# Patient Record
Sex: Female | Born: 1935 | Race: White | Hispanic: No | Smoking: Former smoker
Health system: Southern US, Community
[De-identification: ages and names within clinical notes are randomized; demographics above are authoritative.]

## PROBLEM LIST (undated history)

## (undated) DIAGNOSIS — I10 Essential (primary) hypertension: Secondary | ICD-10-CM

## (undated) DIAGNOSIS — E079 Disorder of thyroid, unspecified: Secondary | ICD-10-CM

## (undated) DIAGNOSIS — F419 Anxiety disorder, unspecified: Secondary | ICD-10-CM

## (undated) DIAGNOSIS — E785 Hyperlipidemia, unspecified: Secondary | ICD-10-CM

## (undated) DIAGNOSIS — Z860101 Personal history of adenomatous and serrated colon polyps: Secondary | ICD-10-CM

## (undated) DIAGNOSIS — K6389 Other specified diseases of intestine: Secondary | ICD-10-CM

## (undated) DIAGNOSIS — I350 Nonrheumatic aortic (valve) stenosis: Secondary | ICD-10-CM

## (undated) DIAGNOSIS — K659 Peritonitis, unspecified: Secondary | ICD-10-CM

## (undated) DIAGNOSIS — K219 Gastro-esophageal reflux disease without esophagitis: Secondary | ICD-10-CM

## (undated) DIAGNOSIS — M1990 Unspecified osteoarthritis, unspecified site: Secondary | ICD-10-CM

## (undated) DIAGNOSIS — I739 Peripheral vascular disease, unspecified: Secondary | ICD-10-CM

## (undated) DIAGNOSIS — I4891 Unspecified atrial fibrillation: Secondary | ICD-10-CM

## (undated) DIAGNOSIS — I442 Atrioventricular block, complete: Secondary | ICD-10-CM

## (undated) DIAGNOSIS — J449 Chronic obstructive pulmonary disease, unspecified: Secondary | ICD-10-CM

## (undated) DIAGNOSIS — Z86010 Personal history of colonic polyps: Secondary | ICD-10-CM

## (undated) DIAGNOSIS — K56609 Unspecified intestinal obstruction, unspecified as to partial versus complete obstruction: Secondary | ICD-10-CM

## (undated) DIAGNOSIS — K5730 Diverticulosis of large intestine without perforation or abscess without bleeding: Secondary | ICD-10-CM

## (undated) DIAGNOSIS — E559 Vitamin D deficiency, unspecified: Secondary | ICD-10-CM

## (undated) DIAGNOSIS — K222 Esophageal obstruction: Secondary | ICD-10-CM

## (undated) HISTORY — DX: Esophageal obstruction: K22.2

## (undated) HISTORY — DX: Personal history of colonic polyps: Z86.010

## (undated) HISTORY — PX: ABDOMINAL ADHESION SURGERY: SHX90

## (undated) HISTORY — DX: Nonrheumatic aortic (valve) stenosis: I35.0

## (undated) HISTORY — PX: CHOLECYSTECTOMY: SHX55

## (undated) HISTORY — DX: Peripheral vascular disease, unspecified: I73.9

## (undated) HISTORY — DX: Disorder of thyroid, unspecified: E07.9

## (undated) HISTORY — PX: CARDIAC VALVE REPLACEMENT: SHX585

## (undated) HISTORY — DX: Unspecified intestinal obstruction, unspecified as to partial versus complete obstruction: K56.609

## (undated) HISTORY — DX: Atrioventricular block, complete: I44.2

## (undated) HISTORY — DX: Anxiety disorder, unspecified: F41.9

## (undated) HISTORY — DX: Essential (primary) hypertension: I10

## (undated) HISTORY — DX: Unspecified atrial fibrillation: I48.91

## (undated) HISTORY — DX: Vitamin D deficiency, unspecified: E55.9

## (undated) HISTORY — DX: Gastro-esophageal reflux disease without esophagitis: K21.9

## (undated) HISTORY — DX: Hyperlipidemia, unspecified: E78.5

## (undated) HISTORY — DX: Other specified diseases of intestine: K63.89

## (undated) HISTORY — DX: Peritonitis, unspecified: K65.9

## (undated) HISTORY — PX: OMENTECTOMY: SHX2098

## (undated) HISTORY — DX: Chronic obstructive pulmonary disease, unspecified: J44.9

## (undated) HISTORY — DX: Unspecified osteoarthritis, unspecified site: M19.90

## (undated) HISTORY — DX: Personal history of adenomatous and serrated colon polyps: Z86.0101

## (undated) HISTORY — DX: Diverticulosis of large intestine without perforation or abscess without bleeding: K57.30

## (undated) HISTORY — PX: SIGMOIDECTOMY: SHX176

## (undated) HISTORY — PX: HIP SURGERY: SHX245

---

## 1994-01-21 ENCOUNTER — Encounter (INDEPENDENT_AMBULATORY_CARE_PROVIDER_SITE_OTHER): Payer: Self-pay | Admitting: *Deleted

## 1994-01-22 ENCOUNTER — Encounter (INDEPENDENT_AMBULATORY_CARE_PROVIDER_SITE_OTHER): Payer: Self-pay | Admitting: *Deleted

## 1995-07-21 HISTORY — PX: COLOSTOMY CLOSURE: SHX1381

## 1995-07-21 HISTORY — PX: APPENDECTOMY: SHX54

## 1995-10-26 ENCOUNTER — Encounter (INDEPENDENT_AMBULATORY_CARE_PROVIDER_SITE_OTHER): Payer: Self-pay | Admitting: *Deleted

## 1995-10-28 ENCOUNTER — Encounter (INDEPENDENT_AMBULATORY_CARE_PROVIDER_SITE_OTHER): Payer: Self-pay | Admitting: *Deleted

## 1995-11-08 ENCOUNTER — Encounter (INDEPENDENT_AMBULATORY_CARE_PROVIDER_SITE_OTHER): Payer: Self-pay | Admitting: *Deleted

## 1996-04-06 ENCOUNTER — Encounter (INDEPENDENT_AMBULATORY_CARE_PROVIDER_SITE_OTHER): Payer: Self-pay | Admitting: *Deleted

## 1996-04-27 ENCOUNTER — Encounter (INDEPENDENT_AMBULATORY_CARE_PROVIDER_SITE_OTHER): Payer: Self-pay | Admitting: *Deleted

## 1996-04-28 ENCOUNTER — Encounter (INDEPENDENT_AMBULATORY_CARE_PROVIDER_SITE_OTHER): Payer: Self-pay | Admitting: *Deleted

## 1996-05-07 ENCOUNTER — Encounter (INDEPENDENT_AMBULATORY_CARE_PROVIDER_SITE_OTHER): Payer: Self-pay | Admitting: *Deleted

## 1997-01-09 ENCOUNTER — Encounter (INDEPENDENT_AMBULATORY_CARE_PROVIDER_SITE_OTHER): Payer: Self-pay | Admitting: *Deleted

## 1997-09-03 ENCOUNTER — Encounter (INDEPENDENT_AMBULATORY_CARE_PROVIDER_SITE_OTHER): Payer: Self-pay | Admitting: *Deleted

## 1998-06-17 ENCOUNTER — Other Ambulatory Visit: Admission: RE | Admit: 1998-06-17 | Discharge: 1998-06-17 | Payer: Self-pay | Admitting: Obstetrics and Gynecology

## 1999-03-19 ENCOUNTER — Other Ambulatory Visit: Admission: RE | Admit: 1999-03-19 | Discharge: 1999-03-19 | Payer: Self-pay | Admitting: Obstetrics and Gynecology

## 1999-07-17 ENCOUNTER — Encounter (INDEPENDENT_AMBULATORY_CARE_PROVIDER_SITE_OTHER): Payer: Self-pay | Admitting: *Deleted

## 1999-08-12 ENCOUNTER — Ambulatory Visit (HOSPITAL_COMMUNITY): Admission: RE | Admit: 1999-08-12 | Discharge: 1999-08-12 | Payer: Self-pay | Admitting: Gastroenterology

## 1999-08-12 ENCOUNTER — Encounter (INDEPENDENT_AMBULATORY_CARE_PROVIDER_SITE_OTHER): Payer: Self-pay | Admitting: *Deleted

## 1999-08-12 ENCOUNTER — Encounter: Payer: Self-pay | Admitting: Gastroenterology

## 2000-07-04 ENCOUNTER — Encounter: Payer: Self-pay | Admitting: Emergency Medicine

## 2000-07-04 ENCOUNTER — Emergency Department (HOSPITAL_COMMUNITY): Admission: EM | Admit: 2000-07-04 | Discharge: 2000-07-04 | Payer: Self-pay

## 2002-01-24 ENCOUNTER — Encounter (INDEPENDENT_AMBULATORY_CARE_PROVIDER_SITE_OTHER): Payer: Self-pay | Admitting: *Deleted

## 2002-01-24 ENCOUNTER — Other Ambulatory Visit: Admission: RE | Admit: 2002-01-24 | Discharge: 2002-01-24 | Payer: Self-pay | Admitting: Obstetrics and Gynecology

## 2002-02-20 ENCOUNTER — Ambulatory Visit (HOSPITAL_COMMUNITY): Admission: RE | Admit: 2002-02-20 | Discharge: 2002-02-20 | Payer: Self-pay | Admitting: Gastroenterology

## 2002-02-20 ENCOUNTER — Encounter (INDEPENDENT_AMBULATORY_CARE_PROVIDER_SITE_OTHER): Payer: Self-pay | Admitting: *Deleted

## 2003-02-27 ENCOUNTER — Emergency Department (HOSPITAL_COMMUNITY): Admission: EM | Admit: 2003-02-27 | Discharge: 2003-02-28 | Payer: Self-pay | Admitting: Emergency Medicine

## 2003-05-02 ENCOUNTER — Encounter: Admission: RE | Admit: 2003-05-02 | Discharge: 2003-05-02 | Payer: Self-pay | Admitting: Internal Medicine

## 2003-05-02 ENCOUNTER — Encounter (INDEPENDENT_AMBULATORY_CARE_PROVIDER_SITE_OTHER): Payer: Self-pay | Admitting: *Deleted

## 2003-05-02 ENCOUNTER — Encounter: Payer: Self-pay | Admitting: Internal Medicine

## 2003-06-18 ENCOUNTER — Encounter: Admission: RE | Admit: 2003-06-18 | Discharge: 2003-06-18 | Payer: Self-pay | Admitting: General Surgery

## 2003-06-18 ENCOUNTER — Encounter (INDEPENDENT_AMBULATORY_CARE_PROVIDER_SITE_OTHER): Payer: Self-pay | Admitting: *Deleted

## 2003-06-25 ENCOUNTER — Encounter (INDEPENDENT_AMBULATORY_CARE_PROVIDER_SITE_OTHER): Payer: Self-pay | Admitting: *Deleted

## 2003-07-23 ENCOUNTER — Encounter (INDEPENDENT_AMBULATORY_CARE_PROVIDER_SITE_OTHER): Payer: Self-pay | Admitting: *Deleted

## 2003-08-21 ENCOUNTER — Encounter: Admission: RE | Admit: 2003-08-21 | Discharge: 2003-08-21 | Payer: Self-pay | Admitting: General Surgery

## 2003-08-21 ENCOUNTER — Encounter (INDEPENDENT_AMBULATORY_CARE_PROVIDER_SITE_OTHER): Payer: Self-pay | Admitting: *Deleted

## 2003-08-27 ENCOUNTER — Encounter (INDEPENDENT_AMBULATORY_CARE_PROVIDER_SITE_OTHER): Payer: Self-pay | Admitting: *Deleted

## 2003-09-04 ENCOUNTER — Encounter (INDEPENDENT_AMBULATORY_CARE_PROVIDER_SITE_OTHER): Payer: Self-pay | Admitting: *Deleted

## 2004-01-08 ENCOUNTER — Encounter (INDEPENDENT_AMBULATORY_CARE_PROVIDER_SITE_OTHER): Payer: Self-pay | Admitting: *Deleted

## 2004-02-18 ENCOUNTER — Encounter (INDEPENDENT_AMBULATORY_CARE_PROVIDER_SITE_OTHER): Payer: Self-pay | Admitting: *Deleted

## 2004-02-18 ENCOUNTER — Encounter: Admission: RE | Admit: 2004-02-18 | Discharge: 2004-02-18 | Payer: Self-pay | Admitting: General Surgery

## 2004-05-26 ENCOUNTER — Ambulatory Visit: Payer: Self-pay | Admitting: Internal Medicine

## 2004-06-16 ENCOUNTER — Ambulatory Visit: Payer: Self-pay | Admitting: Internal Medicine

## 2004-08-08 ENCOUNTER — Ambulatory Visit: Payer: Self-pay | Admitting: Internal Medicine

## 2004-08-11 ENCOUNTER — Ambulatory Visit: Payer: Self-pay | Admitting: Internal Medicine

## 2004-10-29 ENCOUNTER — Encounter (INDEPENDENT_AMBULATORY_CARE_PROVIDER_SITE_OTHER): Payer: Self-pay | Admitting: *Deleted

## 2004-10-29 ENCOUNTER — Encounter: Admission: RE | Admit: 2004-10-29 | Discharge: 2004-10-29 | Payer: Self-pay | Admitting: General Surgery

## 2004-11-03 ENCOUNTER — Encounter (INDEPENDENT_AMBULATORY_CARE_PROVIDER_SITE_OTHER): Payer: Self-pay | Admitting: *Deleted

## 2004-11-10 ENCOUNTER — Ambulatory Visit: Payer: Self-pay | Admitting: Internal Medicine

## 2004-11-11 ENCOUNTER — Encounter (INDEPENDENT_AMBULATORY_CARE_PROVIDER_SITE_OTHER): Payer: Self-pay | Admitting: *Deleted

## 2004-11-24 ENCOUNTER — Encounter (HOSPITAL_COMMUNITY): Admission: RE | Admit: 2004-11-24 | Discharge: 2004-11-27 | Payer: Self-pay | Admitting: General Surgery

## 2004-11-27 ENCOUNTER — Encounter (INDEPENDENT_AMBULATORY_CARE_PROVIDER_SITE_OTHER): Payer: Self-pay | Admitting: *Deleted

## 2005-02-09 ENCOUNTER — Ambulatory Visit: Payer: Self-pay | Admitting: Internal Medicine

## 2005-02-18 ENCOUNTER — Ambulatory Visit: Payer: Self-pay

## 2005-05-11 ENCOUNTER — Ambulatory Visit: Payer: Self-pay | Admitting: Internal Medicine

## 2005-06-22 ENCOUNTER — Ambulatory Visit: Payer: Self-pay | Admitting: Cardiology

## 2005-06-24 ENCOUNTER — Ambulatory Visit: Payer: Self-pay | Admitting: *Deleted

## 2005-06-24 ENCOUNTER — Encounter (INDEPENDENT_AMBULATORY_CARE_PROVIDER_SITE_OTHER): Payer: Self-pay | Admitting: *Deleted

## 2005-08-12 ENCOUNTER — Ambulatory Visit: Payer: Self-pay | Admitting: Internal Medicine

## 2005-11-10 ENCOUNTER — Ambulatory Visit: Payer: Self-pay | Admitting: Internal Medicine

## 2006-01-07 ENCOUNTER — Ambulatory Visit: Payer: Self-pay | Admitting: Internal Medicine

## 2006-01-09 ENCOUNTER — Ambulatory Visit: Payer: Self-pay | Admitting: Internal Medicine

## 2006-02-09 ENCOUNTER — Ambulatory Visit: Payer: Self-pay | Admitting: Internal Medicine

## 2006-05-10 ENCOUNTER — Ambulatory Visit: Payer: Self-pay | Admitting: Internal Medicine

## 2006-05-10 LAB — CONVERTED CEMR LAB
HDL: 40.9 mg/dL (ref >39.0)
LDL Cholesterol: 101 mg/dL — ABNORMAL HIGH (ref 0–99)
Triglyceride fasting, serum: 167 mg/dL — ABNORMAL HIGH (ref 0–149)
VLDL: 33 mg/dL (ref 0–40)

## 2006-05-12 ENCOUNTER — Ambulatory Visit: Payer: Self-pay | Admitting: Internal Medicine

## 2006-07-01 ENCOUNTER — Encounter (INDEPENDENT_AMBULATORY_CARE_PROVIDER_SITE_OTHER): Payer: Self-pay | Admitting: *Deleted

## 2006-07-01 ENCOUNTER — Inpatient Hospital Stay (HOSPITAL_COMMUNITY): Admission: EM | Admit: 2006-07-01 | Discharge: 2006-07-03 | Payer: Self-pay | Admitting: Emergency Medicine

## 2006-07-02 ENCOUNTER — Encounter (INDEPENDENT_AMBULATORY_CARE_PROVIDER_SITE_OTHER): Payer: Self-pay | Admitting: *Deleted

## 2006-08-13 ENCOUNTER — Ambulatory Visit: Payer: Self-pay | Admitting: Internal Medicine

## 2006-11-10 ENCOUNTER — Ambulatory Visit: Payer: Self-pay | Admitting: Internal Medicine

## 2006-11-10 LAB — CONVERTED CEMR LAB
BUN: 13 mg/dL (ref 6–23)
Direct LDL: 81.2 mg/dL
Eosinophils Relative: 2.8 % (ref 0.0–5.0)
Glucose, Bld: 113 mg/dL — ABNORMAL HIGH (ref 70–99)
HCT: 38.6 % (ref 36.0–46.0)
Neutrophils Relative %: 45.8 % (ref 43.0–77.0)
Potassium: 3.8 meq/L (ref 3.5–5.1)
RBC: 4.32 M/uL (ref 3.87–5.11)
RDW: 12.6 % (ref 11.5–14.6)
Total CHOL/HDL Ratio: 3.5
Triglycerides: 205 mg/dL (ref 0–149)
VLDL: 41 mg/dL — ABNORMAL HIGH (ref 0–40)
WBC: 5.5 10*3/uL (ref 4.5–10.5)

## 2006-11-15 ENCOUNTER — Ambulatory Visit: Payer: Self-pay | Admitting: Internal Medicine

## 2006-12-14 ENCOUNTER — Ambulatory Visit: Payer: Self-pay | Admitting: Internal Medicine

## 2006-12-14 LAB — CONVERTED CEMR LAB
Basophils Absolute: 0.3 10*3/uL — ABNORMAL HIGH (ref 0.0–0.1)
Eosinophils Absolute: 0.1 10*3/uL (ref 0.0–0.6)
HCT: 41.1 % (ref 36.0–46.0)
Hemoglobin, Urine: NEGATIVE
Ketones, ur: NEGATIVE mg/dL
Lymphocytes Relative: 38.6 % (ref 12.0–46.0)
MCHC: 34.4 g/dL (ref 30.0–36.0)
MCV: 87.9 fL (ref 78.0–100.0)
Neutro Abs: 2.5 10*3/uL (ref 1.4–7.7)
Neutrophils Relative %: 45.3 % (ref 43.0–77.0)
RBC: 4.68 M/uL (ref 3.87–5.11)
Urine Glucose: NEGATIVE mg/dL
Urobilinogen, UA: 0.2 (ref 0.0–1.0)

## 2006-12-17 ENCOUNTER — Ambulatory Visit: Payer: Self-pay | Admitting: Internal Medicine

## 2007-02-15 ENCOUNTER — Ambulatory Visit: Payer: Self-pay | Admitting: Internal Medicine

## 2007-02-15 LAB — CONVERTED CEMR LAB
ALT: 19 units/L (ref 0–35)
AST: 19 units/L (ref 0–37)
Albumin: 4.4 g/dL (ref 3.5–5.2)
Alkaline Phosphatase: 66 units/L (ref 39–117)
BUN: 11 mg/dL (ref 6–23)
Calcium: 10 mg/dL (ref 8.4–10.5)
Chloride: 102 meq/L (ref 96–112)
Cholesterol: 245 mg/dL (ref 0–200)
Creatinine, Ser: 0.8 mg/dL (ref 0.4–1.2)
GFR calc non Af Amer: 72 mL/min
Total Bilirubin: 1.3 mg/dL — ABNORMAL HIGH (ref 0.3–1.2)
Total CHOL/HDL Ratio: 5.6
VLDL: 47 mg/dL — ABNORMAL HIGH (ref 0–40)
Vit D, 1,25-Dihydroxy: 19 — ABNORMAL LOW (ref 20–57)

## 2007-02-26 DIAGNOSIS — I739 Peripheral vascular disease, unspecified: Secondary | ICD-10-CM

## 2007-02-26 DIAGNOSIS — J4489 Other specified chronic obstructive pulmonary disease: Secondary | ICD-10-CM | POA: Insufficient documentation

## 2007-02-26 DIAGNOSIS — R1909 Other intra-abdominal and pelvic swelling, mass and lump: Secondary | ICD-10-CM

## 2007-02-26 DIAGNOSIS — I10 Essential (primary) hypertension: Secondary | ICD-10-CM

## 2007-02-26 DIAGNOSIS — K219 Gastro-esophageal reflux disease without esophagitis: Secondary | ICD-10-CM | POA: Insufficient documentation

## 2007-02-26 DIAGNOSIS — K5730 Diverticulosis of large intestine without perforation or abscess without bleeding: Secondary | ICD-10-CM | POA: Insufficient documentation

## 2007-02-26 DIAGNOSIS — J449 Chronic obstructive pulmonary disease, unspecified: Secondary | ICD-10-CM

## 2007-02-26 DIAGNOSIS — E785 Hyperlipidemia, unspecified: Secondary | ICD-10-CM | POA: Insufficient documentation

## 2007-05-19 ENCOUNTER — Encounter: Payer: Self-pay | Admitting: Internal Medicine

## 2007-05-19 ENCOUNTER — Ambulatory Visit: Payer: Self-pay | Admitting: Internal Medicine

## 2007-07-21 ENCOUNTER — Inpatient Hospital Stay (HOSPITAL_COMMUNITY): Admission: EM | Admit: 2007-07-21 | Discharge: 2007-07-23 | Payer: Self-pay | Admitting: Emergency Medicine

## 2007-07-21 ENCOUNTER — Encounter (INDEPENDENT_AMBULATORY_CARE_PROVIDER_SITE_OTHER): Payer: Self-pay | Admitting: *Deleted

## 2007-07-21 ENCOUNTER — Ambulatory Visit: Payer: Self-pay | Admitting: Internal Medicine

## 2007-07-22 ENCOUNTER — Encounter (INDEPENDENT_AMBULATORY_CARE_PROVIDER_SITE_OTHER): Payer: Self-pay | Admitting: *Deleted

## 2007-07-23 ENCOUNTER — Encounter (INDEPENDENT_AMBULATORY_CARE_PROVIDER_SITE_OTHER): Payer: Self-pay | Admitting: *Deleted

## 2007-07-27 ENCOUNTER — Ambulatory Visit: Payer: Self-pay | Admitting: Internal Medicine

## 2007-07-27 DIAGNOSIS — K56609 Unspecified intestinal obstruction, unspecified as to partial versus complete obstruction: Secondary | ICD-10-CM | POA: Insufficient documentation

## 2007-08-29 ENCOUNTER — Ambulatory Visit: Payer: Self-pay | Admitting: Internal Medicine

## 2007-08-29 DIAGNOSIS — E559 Vitamin D deficiency, unspecified: Secondary | ICD-10-CM | POA: Insufficient documentation

## 2007-08-29 DIAGNOSIS — R110 Nausea: Secondary | ICD-10-CM

## 2007-08-30 LAB — CONVERTED CEMR LAB
ALT: 22 units/L (ref 0–35)
Albumin: 3.8 g/dL (ref 3.5–5.2)
Alkaline Phosphatase: 71 units/L (ref 39–117)
BUN: 8 mg/dL (ref 6–23)
Basophils Absolute: 0.1 10*3/uL (ref 0.0–0.1)
Basophils Relative: 1.2 % — ABNORMAL HIGH (ref 0.0–1.0)
CO2: 30 meq/L (ref 19–32)
Calcium: 9.9 mg/dL (ref 8.4–10.5)
Cholesterol: 215 mg/dL (ref 0–200)
Creatinine, Ser: 0.9 mg/dL (ref 0.4–1.2)
GFR calc Af Amer: 76 mL/min
HDL: 50.7 mg/dL (ref >39.0)
MCHC: 33.5 g/dL (ref 30.0–36.0)
Monocytes Absolute: 0.5 10*3/uL (ref 0.2–0.7)
Monocytes Relative: 9.1 % (ref 3.0–11.0)
Neutro Abs: 2.8 10*3/uL (ref 1.4–7.7)
Platelets: 287 10*3/uL (ref 150–400)
Potassium: 4.3 meq/L (ref 3.5–5.1)
RDW: 12.1 % (ref 11.5–14.6)
Total CHOL/HDL Ratio: 4.2
Total Protein: 7.3 g/dL (ref 6.0–8.3)
VLDL: 28 mg/dL (ref 0–40)

## 2007-08-31 LAB — CONVERTED CEMR LAB: Vit D, 1,25-Dihydroxy: 20 — ABNORMAL LOW (ref 30–89)

## 2007-10-10 ENCOUNTER — Encounter: Payer: Self-pay | Admitting: Internal Medicine

## 2007-10-31 ENCOUNTER — Telehealth (INDEPENDENT_AMBULATORY_CARE_PROVIDER_SITE_OTHER): Payer: Self-pay | Admitting: *Deleted

## 2007-11-02 ENCOUNTER — Ambulatory Visit: Payer: Self-pay | Admitting: Internal Medicine

## 2007-11-02 DIAGNOSIS — R209 Unspecified disturbances of skin sensation: Secondary | ICD-10-CM

## 2007-11-02 DIAGNOSIS — M79609 Pain in unspecified limb: Secondary | ICD-10-CM

## 2007-11-02 LAB — CONVERTED CEMR LAB
BUN: 14 mg/dL (ref 6–23)
Basophils Relative: 1 % (ref 0.0–1.0)
Calcium: 10 mg/dL (ref 8.4–10.5)
Creatinine, Ser: 0.8 mg/dL (ref 0.4–1.2)
Eosinophils Absolute: 0.1 10*3/uL (ref 0.0–0.7)
Eosinophils Relative: 1.4 % (ref 0.0–5.0)
GFR calc Af Amer: 87 mL/min
GFR calc non Af Amer: 72 mL/min
Glucose, Bld: 119 mg/dL — ABNORMAL HIGH (ref 70–99)
HCT: 37.9 % (ref 36.0–46.0)
Hemoglobin: 13.3 g/dL (ref 12.0–15.0)
MCV: 89.7 fL (ref 78.0–100.0)
Monocytes Absolute: 0.5 10*3/uL (ref 0.1–1.0)
Monocytes Relative: 9.3 % (ref 3.0–12.0)
Neutro Abs: 3.6 10*3/uL (ref 1.4–7.7)
WBC: 5.7 10*3/uL (ref 4.5–10.5)

## 2007-11-28 ENCOUNTER — Encounter: Payer: Self-pay | Admitting: Internal Medicine

## 2007-12-08 ENCOUNTER — Telehealth: Payer: Self-pay | Admitting: Internal Medicine

## 2008-01-10 ENCOUNTER — Ambulatory Visit: Payer: Self-pay | Admitting: Internal Medicine

## 2008-03-30 ENCOUNTER — Encounter: Payer: Self-pay | Admitting: Internal Medicine

## 2008-04-04 ENCOUNTER — Encounter: Payer: Self-pay | Admitting: Internal Medicine

## 2008-05-09 ENCOUNTER — Ambulatory Visit: Payer: Self-pay | Admitting: Internal Medicine

## 2008-05-09 LAB — CONVERTED CEMR LAB
Albumin: 3.7 g/dL (ref 3.5–5.2)
Alkaline Phosphatase: 64 units/L (ref 39–117)
BUN: 14 mg/dL (ref 6–23)
Cholesterol: 195 mg/dL (ref 0–200)
GFR calc Af Amer: 87 mL/min
GFR calc non Af Amer: 72 mL/min
LDL Cholesterol: 123 mg/dL — ABNORMAL HIGH (ref 0–99)
Potassium: 4 meq/L (ref 3.5–5.1)
Total Bilirubin: 1 mg/dL (ref 0.3–1.2)
Total CHOL/HDL Ratio: 4.5
VLDL: 29 mg/dL (ref 0–40)

## 2008-05-15 ENCOUNTER — Ambulatory Visit: Payer: Self-pay | Admitting: Internal Medicine

## 2008-09-04 ENCOUNTER — Telehealth: Payer: Self-pay | Admitting: Internal Medicine

## 2008-09-06 ENCOUNTER — Telehealth: Payer: Self-pay | Admitting: Internal Medicine

## 2008-09-07 ENCOUNTER — Telehealth: Payer: Self-pay | Admitting: Internal Medicine

## 2008-09-11 ENCOUNTER — Ambulatory Visit: Payer: Self-pay | Admitting: Internal Medicine

## 2008-09-11 DIAGNOSIS — H109 Unspecified conjunctivitis: Secondary | ICD-10-CM | POA: Insufficient documentation

## 2008-09-11 DIAGNOSIS — R21 Rash and other nonspecific skin eruption: Secondary | ICD-10-CM

## 2008-09-11 DIAGNOSIS — K1370 Unspecified lesions of oral mucosa: Secondary | ICD-10-CM

## 2008-09-13 LAB — CONVERTED CEMR LAB
AST: 22 units/L (ref 0–37)
Albumin: 3.9 g/dL (ref 3.5–5.2)
Alkaline Phosphatase: 73 units/L (ref 39–117)
Basophils Absolute: 0 10*3/uL (ref 0.0–0.1)
Bilirubin, Direct: 0.1 mg/dL (ref 0.0–0.3)
Chloride: 97 meq/L (ref 96–112)
Eosinophils Absolute: 0.2 10*3/uL (ref 0.0–0.7)
Eosinophils Relative: 1.7 % (ref 0.0–5.0)
GFR calc Af Amer: 102 mL/min
GFR calc non Af Amer: 84 mL/min
HCT: 40 % (ref 36.0–46.0)
Hemoglobin: 13.9 g/dL (ref 12.0–15.0)
Ketones, ur: NEGATIVE mg/dL
Leukocytes, UA: NEGATIVE
MCHC: 34.7 g/dL (ref 30.0–36.0)
MCV: 90.8 fL (ref 78.0–100.0)
Monocytes Absolute: 0.8 10*3/uL (ref 0.1–1.0)
Neutrophils Relative %: 67.8 % (ref 43.0–77.0)
Nitrite: NEGATIVE
Platelets: 340 10*3/uL (ref 150–400)
Potassium: 4.6 meq/L (ref 3.5–5.1)
RBC: 4.41 M/uL (ref 3.87–5.11)
RDW: 11.8 % (ref 11.5–14.6)
Sodium: 137 meq/L (ref 135–145)
Specific Gravity, Urine: 1.01 (ref 1.000–1.03)
Total Bilirubin: 0.8 mg/dL (ref 0.3–1.2)
pH: 6.5 (ref 5.0–8.0)

## 2008-09-18 ENCOUNTER — Ambulatory Visit: Payer: Self-pay | Admitting: Internal Medicine

## 2008-11-22 ENCOUNTER — Encounter: Payer: Self-pay | Admitting: Internal Medicine

## 2009-01-04 ENCOUNTER — Ambulatory Visit: Payer: Self-pay | Admitting: Internal Medicine

## 2009-01-04 LAB — CONVERTED CEMR LAB
ALT: 18 units/L (ref 0–35)
AST: 21 units/L (ref 0–37)
BUN: 12 mg/dL (ref 6–23)
Bilirubin, Direct: 0.2 mg/dL (ref 0.0–0.3)
CO2: 30 meq/L (ref 19–32)
Chloride: 104 meq/L (ref 96–112)
Cholesterol: 221 mg/dL — ABNORMAL HIGH (ref 0–200)
Creatinine, Ser: 0.8 mg/dL (ref 0.4–1.2)
Hgb A1c MFr Bld: 6 % (ref 4.6–6.5)
Potassium: 4.4 meq/L (ref 3.5–5.1)
Total Bilirubin: 1.1 mg/dL (ref 0.3–1.2)
Total CHOL/HDL Ratio: 4
Total Protein: 7.5 g/dL (ref 6.0–8.3)
Triglycerides: 163 mg/dL — ABNORMAL HIGH (ref 0.0–149.0)

## 2009-01-08 ENCOUNTER — Ambulatory Visit: Payer: Self-pay | Admitting: Internal Medicine

## 2009-03-11 ENCOUNTER — Telehealth: Payer: Self-pay | Admitting: Internal Medicine

## 2009-03-11 ENCOUNTER — Ambulatory Visit: Payer: Self-pay | Admitting: Internal Medicine

## 2009-03-11 DIAGNOSIS — R42 Dizziness and giddiness: Secondary | ICD-10-CM

## 2009-04-16 ENCOUNTER — Emergency Department (HOSPITAL_COMMUNITY): Admission: EM | Admit: 2009-04-16 | Discharge: 2009-04-16 | Payer: Self-pay | Admitting: Emergency Medicine

## 2009-04-17 ENCOUNTER — Ambulatory Visit: Payer: Self-pay | Admitting: Internal Medicine

## 2009-04-17 DIAGNOSIS — S0003XA Contusion of scalp, initial encounter: Secondary | ICD-10-CM | POA: Insufficient documentation

## 2009-04-17 DIAGNOSIS — R071 Chest pain on breathing: Secondary | ICD-10-CM

## 2009-04-17 DIAGNOSIS — S0083XA Contusion of other part of head, initial encounter: Secondary | ICD-10-CM

## 2009-04-17 DIAGNOSIS — S1093XA Contusion of unspecified part of neck, initial encounter: Secondary | ICD-10-CM

## 2009-04-25 ENCOUNTER — Telehealth: Payer: Self-pay | Admitting: Internal Medicine

## 2009-04-26 ENCOUNTER — Ambulatory Visit: Payer: Self-pay | Admitting: Internal Medicine

## 2009-05-03 ENCOUNTER — Ambulatory Visit: Payer: Self-pay | Admitting: Internal Medicine

## 2009-05-03 LAB — CONVERTED CEMR LAB
ALT: 17 units/L (ref 0–35)
AST: 18 units/L (ref 0–37)
Alkaline Phosphatase: 93 units/L (ref 39–117)
BUN: 10 mg/dL (ref 6–23)
Chloride: 104 meq/L (ref 96–112)
GFR calc non Af Amer: 83.8 mL/min (ref >60)
LDL Cholesterol: 128 mg/dL — ABNORMAL HIGH (ref 0–99)
Potassium: 4.4 meq/L (ref 3.5–5.1)
Sodium: 141 meq/L (ref 135–145)
Total Bilirubin: 0.7 mg/dL (ref 0.3–1.2)
Total CHOL/HDL Ratio: 4
VLDL: 17.6 mg/dL (ref 0.0–40.0)

## 2009-05-07 ENCOUNTER — Ambulatory Visit: Payer: Self-pay | Admitting: Internal Medicine

## 2009-05-07 DIAGNOSIS — Z87891 Personal history of nicotine dependence: Secondary | ICD-10-CM

## 2009-06-18 ENCOUNTER — Ambulatory Visit: Payer: Self-pay | Admitting: Internal Medicine

## 2009-06-27 ENCOUNTER — Encounter: Payer: Self-pay | Admitting: Internal Medicine

## 2009-07-20 DIAGNOSIS — I4891 Unspecified atrial fibrillation: Secondary | ICD-10-CM

## 2009-07-20 HISTORY — DX: Unspecified atrial fibrillation: I48.91

## 2009-08-08 ENCOUNTER — Ambulatory Visit: Payer: Self-pay | Admitting: Internal Medicine

## 2009-08-08 LAB — CONVERTED CEMR LAB
Chloride: 103 meq/L (ref 96–112)
GFR calc non Af Amer: 83.75 mL/min (ref >60)
Potassium: 4.7 meq/L (ref 3.5–5.1)
Sodium: 142 meq/L (ref 135–145)

## 2009-08-13 ENCOUNTER — Ambulatory Visit: Payer: Self-pay | Admitting: Internal Medicine

## 2009-08-26 ENCOUNTER — Encounter: Payer: Self-pay | Admitting: Internal Medicine

## 2009-08-26 ENCOUNTER — Telehealth: Payer: Self-pay | Admitting: Internal Medicine

## 2009-08-27 ENCOUNTER — Encounter: Payer: Self-pay | Admitting: Internal Medicine

## 2009-10-17 ENCOUNTER — Encounter: Payer: Self-pay | Admitting: Internal Medicine

## 2009-11-13 ENCOUNTER — Encounter: Payer: Self-pay | Admitting: Internal Medicine

## 2009-11-29 ENCOUNTER — Ambulatory Visit: Payer: Self-pay | Admitting: Internal Medicine

## 2009-11-29 LAB — CONVERTED CEMR LAB
ALT: 18 units/L (ref 0–35)
CO2: 31 meq/L (ref 19–32)
Calcium: 9.8 mg/dL (ref 8.4–10.5)
Chloride: 107 meq/L (ref 96–112)
Direct LDL: 129.7 mg/dL
Glucose, Bld: 100 mg/dL — ABNORMAL HIGH (ref 70–99)
HDL: 56.7 mg/dL (ref >39.00)
Hgb A1c MFr Bld: 6 % (ref 4.6–6.5)
Potassium: 4.8 meq/L (ref 3.5–5.1)
Sodium: 143 meq/L (ref 135–145)
TSH: 1.54 microintl units/mL (ref 0.35–5.50)
Total Bilirubin: 0.7 mg/dL (ref 0.3–1.2)
Total CHOL/HDL Ratio: 4
Triglycerides: 147 mg/dL (ref 0.0–149.0)
VLDL: 29.4 mg/dL (ref 0.0–40.0)

## 2009-12-03 ENCOUNTER — Ambulatory Visit: Payer: Self-pay | Admitting: Internal Medicine

## 2010-01-24 ENCOUNTER — Ambulatory Visit: Payer: Self-pay | Admitting: Internal Medicine

## 2010-01-24 DIAGNOSIS — R079 Chest pain, unspecified: Secondary | ICD-10-CM | POA: Insufficient documentation

## 2010-01-24 DIAGNOSIS — F419 Anxiety disorder, unspecified: Secondary | ICD-10-CM

## 2010-02-13 ENCOUNTER — Encounter: Payer: Self-pay | Admitting: Internal Medicine

## 2010-04-04 ENCOUNTER — Ambulatory Visit: Payer: Self-pay | Admitting: Internal Medicine

## 2010-04-04 LAB — CONVERTED CEMR LAB
AST: 17 units/L (ref 0–37)
Alkaline Phosphatase: 63 units/L (ref 39–117)
Basophils Absolute: 0 10*3/uL (ref 0.0–0.1)
Bilirubin, Direct: 0.1 mg/dL (ref 0.0–0.3)
CO2: 31 meq/L (ref 19–32)
Calcium: 9.8 mg/dL (ref 8.4–10.5)
Creatinine, Ser: 0.7 mg/dL (ref 0.4–1.2)
Eosinophils Absolute: 0.1 10*3/uL (ref 0.0–0.7)
GFR calc non Af Amer: 79.68 mL/min (ref >60)
Glucose, Bld: 89 mg/dL (ref 70–99)
HDL: 48.4 mg/dL (ref >39.00)
Hemoglobin: 13.5 g/dL (ref 12.0–15.0)
Ketones, ur: NEGATIVE mg/dL
Lymphocytes Relative: 32.7 % (ref 12.0–46.0)
MCHC: 34.7 g/dL (ref 30.0–36.0)
Monocytes Relative: 8.5 % (ref 3.0–12.0)
Neutro Abs: 2.4 10*3/uL (ref 1.4–7.7)
Neutrophils Relative %: 45.6 % (ref 43.0–77.0)
RDW: 12.9 % (ref 11.5–14.6)
Sodium: 142 meq/L (ref 135–145)
Specific Gravity, Urine: 1.015 (ref 1.000–1.030)
Total Bilirubin: 0.7 mg/dL (ref 0.3–1.2)
Total CHOL/HDL Ratio: 4
Total Protein, Urine: NEGATIVE mg/dL
Triglycerides: 133 mg/dL (ref 0.0–149.0)
Urine Glucose: NEGATIVE mg/dL
Urobilinogen, UA: 1 (ref 0.0–1.0)
VLDL: 26.6 mg/dL (ref 0.0–40.0)
pH: 7.5 (ref 5.0–8.0)

## 2010-04-08 ENCOUNTER — Ambulatory Visit: Payer: Self-pay | Admitting: Internal Medicine

## 2010-04-08 DIAGNOSIS — R238 Other skin changes: Secondary | ICD-10-CM

## 2010-05-21 ENCOUNTER — Ambulatory Visit: Payer: Self-pay | Admitting: Internal Medicine

## 2010-05-21 ENCOUNTER — Encounter: Payer: Self-pay | Admitting: Internal Medicine

## 2010-05-21 DIAGNOSIS — R011 Cardiac murmur, unspecified: Secondary | ICD-10-CM

## 2010-05-21 DIAGNOSIS — I4891 Unspecified atrial fibrillation: Secondary | ICD-10-CM | POA: Insufficient documentation

## 2010-05-21 DIAGNOSIS — I359 Nonrheumatic aortic valve disorder, unspecified: Secondary | ICD-10-CM

## 2010-05-27 ENCOUNTER — Telehealth: Payer: Self-pay | Admitting: Internal Medicine

## 2010-05-27 ENCOUNTER — Ambulatory Visit: Payer: Self-pay | Admitting: Internal Medicine

## 2010-05-28 LAB — CONVERTED CEMR LAB
Basophils Relative: 0.7 % (ref 0.0–3.0)
CO2: 22 meq/L (ref 19–32)
Chloride: 104 meq/L (ref 96–112)
Creatinine, Ser: 0.9 mg/dL (ref 0.4–1.2)
Eosinophils Relative: 2.3 % (ref 0.0–5.0)
Hemoglobin: 11.9 g/dL — ABNORMAL LOW (ref 12.0–15.0)
Lymphocytes Relative: 16.7 % (ref 12.0–46.0)
MCV: 92.8 fL (ref 78.0–100.0)
Monocytes Absolute: 0.3 10*3/uL (ref 0.1–1.0)
Neutro Abs: 5.8 10*3/uL (ref 1.4–7.7)
Neutrophils Relative %: 76.2 % (ref 43.0–77.0)
Potassium: 4 meq/L (ref 3.5–5.1)
RBC: 3.75 M/uL — ABNORMAL LOW (ref 3.87–5.11)
Sodium: 139 meq/L (ref 135–145)
WBC: 7.6 10*3/uL (ref 4.5–10.5)

## 2010-06-08 ENCOUNTER — Inpatient Hospital Stay (HOSPITAL_COMMUNITY): Admission: EM | Admit: 2010-06-08 | Discharge: 2010-06-12 | Payer: Self-pay | Admitting: Emergency Medicine

## 2010-06-08 ENCOUNTER — Encounter (INDEPENDENT_AMBULATORY_CARE_PROVIDER_SITE_OTHER): Payer: Self-pay | Admitting: *Deleted

## 2010-06-09 ENCOUNTER — Encounter (INDEPENDENT_AMBULATORY_CARE_PROVIDER_SITE_OTHER): Payer: Self-pay | Admitting: *Deleted

## 2010-06-10 ENCOUNTER — Encounter (INDEPENDENT_AMBULATORY_CARE_PROVIDER_SITE_OTHER): Payer: Self-pay | Admitting: *Deleted

## 2010-07-07 LAB — CONVERTED CEMR LAB
BUN: 23 mg/dL
CO2: 27 meq/L
Calcium: 10.8 mg/dL
Chloride: 99 meq/L
Creatinine, Ser: 1.22 mg/dL

## 2010-07-09 ENCOUNTER — Ambulatory Visit: Payer: Self-pay | Admitting: Internal Medicine

## 2010-07-09 ENCOUNTER — Telehealth: Payer: Self-pay | Admitting: Internal Medicine

## 2010-07-09 DIAGNOSIS — R1084 Generalized abdominal pain: Secondary | ICD-10-CM

## 2010-07-09 DIAGNOSIS — R1310 Dysphagia, unspecified: Secondary | ICD-10-CM | POA: Insufficient documentation

## 2010-07-09 DIAGNOSIS — R634 Abnormal weight loss: Secondary | ICD-10-CM

## 2010-07-10 LAB — CONVERTED CEMR LAB
ALT: 20 units/L (ref 0–35)
AST: 21 units/L (ref 0–37)
Albumin: 4.7 g/dL (ref 3.5–5.2)
Alkaline Phosphatase: 73 units/L (ref 39–117)
Basophils Absolute: 0.1 10*3/uL (ref 0.0–0.1)
Calcium: 10.8 mg/dL — ABNORMAL HIGH (ref 8.4–10.5)
Eosinophils Relative: 0.2 % (ref 0.0–5.0)
GFR calc non Af Amer: 42.41 mL/min — ABNORMAL LOW (ref >60.00)
Glucose, Bld: 100 mg/dL — ABNORMAL HIGH (ref 70–99)
H Pylori IgG: NEGATIVE
HCT: 47.8 % — ABNORMAL HIGH (ref 36.0–46.0)
Hemoglobin: 16.3 g/dL — ABNORMAL HIGH (ref 12.0–15.0)
Ketones, ur: NEGATIVE mg/dL
Lymphocytes Relative: 13.2 % (ref 12.0–46.0)
Lymphs Abs: 1.1 10*3/uL (ref 0.7–4.0)
Monocytes Relative: 6.6 % (ref 3.0–12.0)
Neutro Abs: 6.6 10*3/uL (ref 1.4–7.7)
Potassium: 5.6 meq/L — ABNORMAL HIGH (ref 3.5–5.1)
RBC: 5.34 M/uL — ABNORMAL HIGH (ref 3.87–5.11)
RDW: 14 % (ref 11.5–14.6)
Sodium: 138 meq/L (ref 135–145)
Specific Gravity, Urine: 1.01 (ref 1.000–1.030)
Total Protein, Urine: NEGATIVE mg/dL
Urine Glucose: NEGATIVE mg/dL
WBC: 8.3 10*3/uL (ref 4.5–10.5)

## 2010-07-16 ENCOUNTER — Ambulatory Visit: Payer: Self-pay | Admitting: Internal Medicine

## 2010-07-29 ENCOUNTER — Encounter: Payer: Self-pay | Admitting: Internal Medicine

## 2010-08-20 ENCOUNTER — Ambulatory Visit: Admit: 2010-08-20 | Payer: Self-pay | Admitting: Internal Medicine

## 2010-08-21 NOTE — Letter (Signed)
Summary: [facility]  [facility]   Imported By: Lester Susan Moore 11/13/2009 10:48:55  _____________________________________________________________________  External Attachment:    Type:   Image     Comment:   External Document

## 2010-08-21 NOTE — Assessment & Plan Note (Signed)
Summary: 1 WK [name] /NWS   Vital Signs:  Patient profile:   77 year old female Menstrual status:  postmenopausal Height:      66 inches Weight:      128 pounds BMI:     20.73 Temp:     97.6 degrees F oral Pulse rate:   81 / minute Pulse rhythm:   regular Resp:     16 per minute BP sitting:   134 / 70  (left arm) Cuff size:   regular  Vitals Entered By: Lanier Prude, CMA(AAMA) (July 16, 2010 10:09 AM) CC: 1 wk f/u  Is Patient Diabetic? No Comments pt is not taking b complete, Crestor, Prilosec, Lovaza, Mobic, Dyazide or Amiodarone.   Primary Care Provider:  Tresa Garter MD  CC:  1 wk f/u .  History of Present Illness: F/u HTN, n/v and abd pain - much better now F/u HTN, A fib  Current Medications (verified): 1)  B Complete   Tabs (B Complex-Biotin-Fa) .... Once Daily 2)  Crestor 10 Mg  Tabs (Rosuvastatin Calcium) .... Tale One  One Day Skip 2 Days and Then Repeat Over and Over 3)  Vitamin D3 1000 Unit  Tabs (Cholecalciferol) .... Two Times A Day 4)  Aspir-Low 81 Mg Tbec (Aspirin) .Marland Kitchen.. 1 Once Daily Pc 5)  Prilosec Otc 20 Mg Tbec (Omeprazole Magnesium) .Marland Kitchen.. 1 Qd 6)  Lovaza 1 Gm  Caps (Omega-3-Acid Ethyl Esters) .... Take 2 By Mouth Two Times A Day 7)  Mobic 7.5 Mg Tabs (Meloxicam) .Marland Kitchen.. 1 By Mouth Once Daily X 7 Days, Then As Needed For Pain 8)  Amlodipine Besylate 10 Mg Tabs (Amlodipine Besylate) .Marland Kitchen.. 1 By Mouth Once Daily For Blood Pressure 9)  Lisinopril 40 Mg Tabs (Lisinopril) .Marland Kitchen.. 1 By Mouth Qd 10)  Alprazolam 0.5 Mg Tabs (Alprazolam) .Marland Kitchen.. 1 By Mouth Two Three Times A Day As Needed Anxiety 11)  Nitrostat 0.4 Mg Subl (Nitroglycerin) .... One Under The Tongue Q 5 Mins. As Needed Total of 3 Dosages in 15 Mins. 12)  Dyazide 37.5-25 Mg Caps (Triamterene-Hctz) .Marland Kitchen.. 1 By Mouth Once Daily 13)  Amiodarone Hcl 200 Mg Tabs (Amiodarone Hcl) .Marland Kitchen.. 1 By Mouth Once Daily 14)  Cartia Xt 120 Mg Xr24h-Cap (Diltiazem Hcl Coated Beads) .Marland Kitchen.. 1 By Mouth Once Daily 15)  Coumadin 5  Mg Tabs (Warfarin Sodium) .... As Directed 16)  Aciphex 20 Mg Tbec (Rabeprazole Sodium) .Marland Kitchen.. 1 By Mouth Two Times A Day 17)  Coumadin 5 Mg Tabs (Warfarin Sodium) .Marland Kitchen.. 1 Sun, Tue, Thurs and Sat.  2.5mg  Mon, We, Fri.  Allergies (verified): 1)  ! Phenergan 2)  ! Vytorin (Ezetimibe-Simvastatin) 3)  ! Promethazine Hcl (Promethazine Hcl) 4)  ! Microzide (Hydrochlorothiazide)  Past History:  Past Medical History: Last updated: 07/09/2010 Colonic polyps, hx of COPD Diverticulosis, colon GERD Dr Kinnie Scales Hyperlipidemia w/ poor tolerance of statins Hypertension Peripheral vascular disease  Dr Lynnea Ferrier SBO 2008, 2009 due to adhesions Mesenteric 2 cm mass Dr Marisa Severin Dr Thomasena Edis Anxiety Atrial fibrillation 2011  Social History: Last updated: 03/11/2009 Retired Single Former Smoker Regular exercise-yes - [employer]  Family History: Reviewed history from 05/19/2007 and no changes required. Family History of CAD Female 1st degree relative <50 Family History Hypertension  Review of Systems  The patient denies fever, weight loss, chest pain, dyspnea on exertion, and abdominal pain.         BP - nl  Physical Exam  General:  alert, thin, cooperative to examination, and good hygiene.  Ears:  R ear normal and L ear normal.   Nose:  External nasal examination shows no deformity or inflammation. Nasal mucosa are pink and moist without lesions or exudates. Mouth:  Oral mucosa and oropharynx without lesions or exudates.  Teeth in good repair. Not dry Lungs:  normal respiratory effort, no intercostal retractions, no accessory muscle use, normal breath sounds, no dullness, no fremitus, no crackles, and no wheezes.   Heart:   no gallop, no rub, no JVD, tachycardia, and Grade  2/6 systolic ejection murmur over RUSB.  no gallop, no rub, no JVD, tachycardia, irregular rhythm. Abdomen:  soft, non-tender, normal bowel sounds, no distention, no masses, no guarding, no rigidity, no rebound tenderness, no  abdominal hernia, no inguinal hernia, no hepatomegaly, and no splenomegaly.   Msk:  normal ROM, no joint tenderness, no joint swelling, no joint warmth, no redness over joints, no joint deformities, no joint instability, no crepitation, and no muscle atrophy.   Neurologic:  alert & oriented X3, cranial nerves II-XII intact, strength normal in all extremities, sensation intact to light touch, sensation intact to pinprick, gait normal, DTRs symmetrical and normal, finger-to-nose normal, heel-to-shin normal, toes down bilaterally on Babinski, and Romberg negative.   Skin:  turgor normal, color normal, no rashes, no suspicious lesions, no ecchymoses, no petechiae, no purpura, no ulcerations, and no edema.   Psych:  Oriented X3, memory intact for recent and remote, normally interactive, not depressed appearing, and slightly anxious.     Impression & Recommendations:  Problem # 1:  ABDOMINAL PAIN, GENERALIZED (ICD-789.07) Assessment Improved D/c Amiodarone Cont PPI Appt w/Dr Juanda Chance is pending   Problem # 2:  ATRIAL FIBRILLATION (ICD-427.31) Assessment: Unchanged  The following medications were removed from the medication list:    Amlodipine Besylate 10 Mg Tabs (Amlodipine besylate) .Marland Kitchen... 1 by mouth once daily for blood pressure    Amiodarone Hcl 200 Mg Tabs (Amiodarone hcl) .Marland Kitchen... 1 by mouth once daily Her updated medication list for this problem includes:    Cartia Xt 120 Mg Xr24h-cap (Diltiazem hcl coated beads) .Marland Kitchen... 1 by mouth bid    Coumadin 5 Mg Tabs (Warfarin sodium) .Marland Kitchen... As directed    Coumadin 5 Mg Tabs (Warfarin sodium) .Marland Kitchen... 1 sun, tue, thurs and sat.  2.5mg  mon, we, fri.    Toprol Xl 50 Mg Xr24h-tab (Metoprolol succinate) .Marland Kitchen... 1 by mouth once daily at hs    Aspir-low 81 Mg Tbec (Aspirin) .Marland Kitchen... 1 once daily pc  Problem # 3:  DIZZINESS (ICD-780.4) Assessment: Improved  Problem # 4:  NAUSEA (ICD-787.02) Assessment: Improved  Problem # 5:  HYPERLIPIDEMIA (ICD-272.4) Assessment:  Improved  Her updated medication list for this problem includes:    Crestor 10 Mg Tabs (Rosuvastatin calcium) .Marland Kitchen... Tale one  one day skip 2 days and then repeat over and over ok to restart    Lovaza 1 Gm Caps (Omega-3-acid ethyl esters) .Marland Kitchen... Take 2 by mouth two times a day  Complete Medication List: 1)  Crestor 10 Mg Tabs (Rosuvastatin calcium) .... Tale one  one day skip 2 days and then repeat over and over 2)  Lovaza 1 Gm Caps (Omega-3-acid ethyl esters) .... Take 2 by mouth two times a day 3)  Lisinopril 40 Mg Tabs (Lisinopril) .Marland Kitchen.. 1 by mouth qd 4)  Alprazolam 0.5 Mg Tabs (Alprazolam) .Marland Kitchen.. 1 by mouth two three times a day as needed anxiety 5)  Nitrostat 0.4 Mg Subl (Nitroglycerin) .... One under the tongue q 5 mins. as needed  total of 3 dosages in 15 mins. 6)  Dyazide 37.5-25 Mg Caps (Triamterene-hctz) .Marland Kitchen.. 1 by mouth once daily 7)  Cartia Xt 120 Mg Xr24h-cap (Diltiazem hcl coated beads) .Marland Kitchen.. 1 by mouth bid 8)  Coumadin 5 Mg Tabs (Warfarin sodium) .... As directed 9)  Coumadin 5 Mg Tabs (Warfarin sodium) .Marland Kitchen.. 1 sun, tue, thurs and sat.  2.5mg  mon, we, fri. 10)  Protonix 40 Mg Tbec (Pantoprazole sodium) .Marland Kitchen.. 1 by mouth qam for indigestion 11)  Toprol Xl 50 Mg Xr24h-tab (Metoprolol succinate) .Marland Kitchen.. 1 by mouth once daily at hs 12)  Vitamin D3 1000 Unit Tabs (Cholecalciferol) .... Two times a day 13)  Aspir-low 81 Mg Tbec (Aspirin) .Marland Kitchen.. 1 once daily pc  Patient Instructions: 1)  Please schedule a follow-up appointment in 6 wks. 2)  BMP prior to visit, ICD-9: 3)  Hepatic Panel prior to visit, ICD-9: 4)  Lipid Panel prior to visit, ICD-9:414.8 401.01 5)  CBC w/ Diff prior to visit, ICD-9: Prescriptions: TOPROL XL 50 MG XR24H-TAB (METOPROLOL SUCCINATE) 1 by mouth once daily at hs  #30 x 12   Entered and Authorized by:   Tresa Garter MD   Signed by:   Tresa Garter MD on 07/16/2010   Method used:     RxID:   [id number] CARTIA XT 120 MG XR24H-CAP (DILTIAZEM HCL COATED  BEADS) 1 by mouth bid  #60 x 11   Entered and Authorized by:   Tresa Garter MD   Signed by:   Tresa Garter MD on 07/16/2010   Method used:     RxID:   [id number] PROTONIX 40 MG TBEC (PANTOPRAZOLE SODIUM) 1 by mouth qam for indigestion  #90 x 3   Entered and Authorized by:   Tresa Garter MD   Signed by:   Tresa Garter MD on 07/16/2010   Method used:     RxID:   [id number]    Orders Added: 1)  Est. Patient Level IV [01027]

## 2010-08-21 NOTE — Progress Notes (Signed)
Summary: Elevated BP  Phone Note Call from Patient   Summary of Call: [name] is concerned about BP. She has had a few times when bp has been elevated. One reading was 190/87. Dr Jenne Campus gave [name] med to take when over 170 but that has expired. Please advise.  Initial call taken by: Lamar Sprinkles, CMA,  August 26, 2009 3:18 PM  Follow-up for Phone Call        Add Coreg two times a day Keep return office visit  Follow-up by: Tresa Garter MD,  August 27, 2009 7:28 AM  Additional Follow-up for Phone Call Additional follow up Details #1::        left mess to call office back.......................Marland KitchenLamar Sprinkles, CMA  August 27, 2009 8:15 AM     Additional Follow-up for Phone Call Additional follow up Details #2::    Spoke with [name] this am. She spoke with on call nurse and has office visit with [facility] today. She will update [place] after that office visit with any changes to her meds. Med removed Noted. Thx Follow-up by: Lamar Sprinkles, CMA,  August 27, 2009 8:22 AM  New/Updated Medications: COREG 12.5 MG TABS (CARVEDILOL) 1 by mouth bid Prescriptions: COREG 12.5 MG TABS (CARVEDILOL) 1 by mouth bid  #60 x 6   Entered and Authorized by:   Tresa Garter MD   Signed by:   Lamar Sprinkles, CMA on 08/27/2009   Method used:   Electronically to        [facility] [address]* (retail)       [address]       [place], [place]  [address]       Ph: [phone] or [phone]       Fax: [phone]   RxID:   [id number]

## 2010-08-21 NOTE — Assessment & Plan Note (Signed)
Summary: 4 MO [name] / NWS #   Vital Signs:  Patient profile:   77 year old female Height:      66 inches Weight:      139.50 pounds BMI:     22.60 O2 Sat:      94 % on Room air Temp:     98.1 degrees F oral Pulse rate:   72 / minute BP sitting:   134 / 76  (left arm) Cuff size:   regular  Vitals Entered By: Lucious Groves (Dec 03, 2009 10:04 AM)  O2 Flow:  Room air CC: Follow-up visit./kb Is Patient Diabetic? No Pain Assessment Patient in pain? no        Primary Care Provider:  Georgina Quint Plotnikov MD  CC:  Follow-up visit./kb.  History of Present Illness: The patient presents for a follow up of hypertension, PVD, hyperlipidemia   Current Medications (verified): 1)  B Complete   Tabs (B Complex-Biotin-Fa) .... Once Daily 2)  Crestor 10 Mg  Tabs (Rosuvastatin Calcium) .... Tale One  One Day Skip 2 Days and Then Repeat Over and Over 3)  Vitamin D3 1000 Unit  Tabs (Cholecalciferol) .... Two Times A Day 4)  Aspir-Low 81 Mg Tbec (Aspirin) .Marland Kitchen.. 1 Once Daily Pc 5)  Prilosec Otc 20 Mg Tbec (Omeprazole Magnesium) .Marland Kitchen.. 1 Qd 6)  Lovaza 1 Gm  Caps (Omega-3-Acid Ethyl Esters) .... Take 2 By Mouth Two Times A Day 7)  Mobic 7.5 Mg Tabs (Meloxicam) .Marland Kitchen.. 1 By Mouth Once Daily X 7 Days, Then As Needed For Pain  Allergies (verified): 1)  ! Phenergan 2)  ! Vytorin (Ezetimibe-Simvastatin) 3)  ! Promethazine Hcl (Promethazine Hcl) 4)  ! Microzide (Hydrochlorothiazide)  Past History:  Social History: Last updated: 03/11/2009 Retired Single Former Smoker Regular exercise-yes - [employer]  Past Medical History: Colonic polyps, hx of COPD Diverticulosis, colon GERD Hyperlipidemia w/ poor tolerance of statins Hypertension Peripheral vascular disease  Dr Lynnea Ferrier SBO 2008, 2009 due to adhesions Mesenteric 2 cm mass Dr Derrell Lolling  Gyn Dr Thomasena Edis  Review of Systems  The patient denies fever, chest pain, syncope, dyspnea on exertion, prolonged cough, abdominal pain, and melena.     Physical Exam  General:  alert, well-developed, well-nourished, and cooperative to examination.  Mouth:  WNL Lungs:  normal respiratory effort, no intercostal retractions or use of accessory muscles; normal breath sounds bilaterally - no crackles and no wheezes.    Heart:  normal rate, regular rhythm, 2/6 murmur , and no rub. BLE without edema. Abdomen:  S/NT Msk:  Lumbar-sacral spine is less  tender to palpation over paraspinal muscles and painfull with the ROM   Pulses:  symm. Extremities:  No edema Neurologic:  No cranial nerve deficits noted. Station and gait are normal. Plantar reflexes are down-going bilaterally. DTRs are symmetrical throughout. Sensory, motor and coordinative functions appear intact. Strait leg elev (-) B Skin:  smaller hematoma on left forehead (1.5 cm)- gading bruise down left side of face almost gone Psych:  Oriented X3, memory intact for recent and remote, normally interactive, good eye contact, not anxious appearing, not depressed appearing, and not agitated.      Impression & Recommendations:  Problem # 1:  HYPERTENSION (ICD-401.9) Assessment Improved  The following medications were removed from the medication list:    Lotrel 10-20 Mg Caps (Amlodipine besy-benazepril hcl) ..... Once daily Her updated medication list for this problem includes:    Amlodipine Besylate 10 Mg Tabs (Amlodipine besylate) .Marland Kitchen... 1 by  mouth once daily for blood pressure    Lisinopril 40 Mg Tabs (Lisinopril) .Marland Kitchen... 1 by mouth qd  Orders: Prescription Created Electronically [phone])  BP today: 134/76 Prior BP: 144/62 (08/13/2009)  Labs Reviewed: K+: 4.8 (11/29/2009) Creat: : 0.7 (11/29/2009)   Chol: 208 (11/29/2009)   HDL: 56.70 (11/29/2009)   LDL: 128 (05/03/2009)   TG: 147.0 (11/29/2009)  Problem # 2:  PURE HYPERCHOLESTEROLEMIA (ICD-272.0) Assessment: Unchanged  Her updated medication list for this problem includes:    Crestor 10 Mg Tabs (Rosuvastatin calcium) .Marland Kitchen... Tale  one  one day skip 2 days and then repeat over and over    Lovaza 1 Gm Caps (Omega-3-acid ethyl esters) .Marland Kitchen... Take 2 by mouth two times a day  Problem # 3:  HYPERLIPIDEMIA (ICD-272.4) Assessment: Comment Only  Her updated medication list for this problem includes:    Crestor 10 Mg Tabs (Rosuvastatin calcium) .Marland Kitchen... Tale one  one day skip 2 days and then repeat over and over    Lovaza 1 Gm Caps (Omega-3-acid ethyl esters) .Marland Kitchen... Take 2 by mouth two times a day  Problem # 4:  COPD (ICD-496) Assessment: Unchanged  Problem # 5:  COLONIC POLYPS, HX OF (ICD-V12.72) Assessment: Comment Only  Complete Medication List: 1)  B Complete Tabs (B complex-biotin-fa) .... Once daily 2)  Crestor 10 Mg Tabs (Rosuvastatin calcium) .... Tale one  one day skip 2 days and then repeat over and over 3)  Vitamin D3 1000 Unit Tabs (Cholecalciferol) .... Two times a day 4)  Aspir-low 81 Mg Tbec (Aspirin) .Marland Kitchen.. 1 once daily pc 5)  Prilosec Otc 20 Mg Tbec (Omeprazole magnesium) .Marland Kitchen.. 1 qd 6)  Lovaza 1 Gm Caps (Omega-3-acid ethyl esters) .... Take 2 by mouth two times a day 7)  Mobic 7.5 Mg Tabs (Meloxicam) .Marland Kitchen.. 1 by mouth once daily x 7 days, then as needed for pain 8)  Amlodipine Besylate 10 Mg Tabs (Amlodipine besylate) .Marland Kitchen.. 1 by mouth once daily for blood pressure 9)  Lisinopril 40 Mg Tabs (Lisinopril) .Marland Kitchen.. 1 by mouth qd  Patient Instructions: 1)  Please schedule a follow-up appointment in 4 months well w/labs. Prescriptions: CRESTOR 10 MG  TABS (ROSUVASTATIN CALCIUM) tale one  one day skip 2 days and then repeat over and over  #30 Tablet x 11   Entered and Authorized by:   Tresa Garter MD   Signed by:   Tresa Garter MD on 12/03/2009   Method used:   Print then Give to Patient   RxID:   [id number] LISINOPRIL 40 MG TABS (LISINOPRIL) 1 by mouth qd  #30 x 12   Entered and Authorized by:   Tresa Garter MD   Signed by:   Tresa Garter MD on 12/03/2009   Method used:    Electronically to        [facility] [address]* (retail)       [address]       [place], [place]  [address]       Ph: [phone] or [phone]       Fax: [phone]   RxID:   [id number] AMLODIPINE BESYLATE 10 MG TABS (AMLODIPINE BESYLATE) 1 by mouth once daily for blood pressure  #30 x 12   Entered and Authorized by:   Tresa Garter MD   Signed by:   Tresa Garter MD on 12/03/2009   Method used:   Electronically to  [facility] [address]* (retail)       [address]       [place], [place]  [address]       Ph: [phone] or [phone]       Fax: [phone]   RxID:   [id number]

## 2010-08-21 NOTE — Assessment & Plan Note (Signed)
Summary: chest and back pain/pt refused er or other mds-lb   Vital Signs:  Patient profile:   77 year old female Height:      66 inches (167.64 cm) Weight:      137 pounds (62.27 kg) BMI:     22.19 O2 Sat:      95 % on Room air Temp:     98.7 degrees F (37.06 degrees C) oral Pulse rate:   77 / minute Pulse rhythm:   regular Resp:     16 per minute BP sitting:   144 / 80  (left arm) Cuff size:   regular  Vitals Entered By: Lanier Prude, CMA(AAMA) (January 24, 2010 4:35 PM)  O2 Flow:  Room air CC: chest/back pain 2 days ago. Is Patient Diabetic? No Comments pt is not taking Mobic. please remove from list.   She states she had 1 episode on Wed and symptoms are improved today.  She thinks she needs something for anxiety.   Primary Care Provider:  Georgina Quint Azekiel Cremer MD  CC:  chest/back pain 2 days ago.Marland Kitchen  History of Present Illness: C/o CP wed am x 2 h was gone after took Prilosec and ASA. She at [employer] at midnight prior C/o GERD, anxiety  Current Medications (verified): 1)  B Complete   Tabs (B Complex-Biotin-Fa) .... Once Daily 2)  Crestor 10 Mg  Tabs (Rosuvastatin Calcium) .... Tale One  One Day Skip 2 Days and Then Repeat Over and Over 3)  Vitamin D3 1000 Unit  Tabs (Cholecalciferol) .... Two Times A Day 4)  Aspir-Low 81 Mg Tbec (Aspirin) .Marland Kitchen.. 1 Once Daily Pc 5)  Prilosec Otc 20 Mg Tbec (Omeprazole Magnesium) .Marland Kitchen.. 1 Qd 6)  Lovaza 1 Gm  Caps (Omega-3-Acid Ethyl Esters) .... Take 2 By Mouth Two Times A Day 7)  Mobic 7.5 Mg Tabs (Meloxicam) .Marland Kitchen.. 1 By Mouth Once Daily X 7 Days, Then As Needed For Pain 8)  Amlodipine Besylate 10 Mg Tabs (Amlodipine Besylate) .Marland Kitchen.. 1 By Mouth Once Daily For Blood Pressure 9)  Lisinopril 40 Mg Tabs (Lisinopril) .Marland Kitchen.. 1 By Mouth Qd  Allergies (verified): 1)  ! Phenergan 2)  ! Vytorin (Ezetimibe-Simvastatin) 3)  ! Promethazine Hcl (Promethazine Hcl) 4)  ! Microzide (Hydrochlorothiazide)  Past History:  Social History: Last updated:  03/11/2009 Retired Single Former Smoker Regular exercise-yes - [employer]  Past Medical History: Colonic polyps, hx of COPD Diverticulosis, colon GERD Hyperlipidemia w/ poor tolerance of statins Hypertension Peripheral vascular disease  Dr Lynnea Ferrier SBO 2008, 2009 due to adhesions Mesenteric 2 cm mass Dr Derrell Lolling Gyn Dr Thomasena Edis Anxiety  Review of Systems       The patient complains of chest pain.  The patient denies fever, prolonged cough, and abdominal pain.    Physical Exam  General:  alert, well-developed, well-nourished, and cooperative to examination.  Nose:  WNL Mouth:  WNL Neck:  Cervical spine is not  tender to palpation over paraspinal muscles and with the ROM  Chest Wall:  No deformities, masses, or tenderness noted. unable to reproduce pain with palpation of sternum or  ribs Lungs:  normal respiratory effort, no intercostal retractions or use of accessory muscles; normal breath sounds bilaterally - no crackles and no wheezes.    Heart:  normal rate, regular rhythm, 2/6 murmur , and no rub. BLE without edema. Abdomen:  S/NT Msk:  Lumbar-sacral spine is less  tender to palpation over paraspinal muscles and painfull with the ROM   Neurologic:  No cranial nerve deficits noted. Station and gait are normal. Plantar reflexes are down-going bilaterally. DTRs are symmetrical throughout. Sensory, motor and coordinative functions appear intact. Strait leg elev (-) B   Impression & Recommendations:  Problem # 1:  CHEST PAIN (ICD-786.50) likely related to #2 Assessment New See "Patient Instructions". Soft food at night Orders: Cardiolite (Cardiolite) EKG w/ Interpretation (93000) ok NTG just in case prn  Problem # 2:  GERD (ICD-530.81) Assessment: Deteriorated  Her updated medication list for this problem includes:    Prilosec Otc 20 Mg Tbec (Omeprazole magnesium) .Marland Kitchen... 1 qd  Problem # 3:  ANXIETY (ICD-300.00) Assessment: Deteriorated  Her updated medication list for  this problem includes:    Alprazolam 0.5 Mg Tabs (Alprazolam) .Marland Kitchen... 1 by mouth two times a day as needed anxiety  Orders: EKG w/ Interpretation (93000)  Problem # 4:  HYPERTENSION (ICD-401.9) Assessment: Unchanged  Her updated medication list for this problem includes:    Amlodipine Besylate 10 Mg Tabs (Amlodipine besylate) .Marland Kitchen... 1 by mouth once daily for blood pressure    Lisinopril 40 Mg Tabs (Lisinopril) .Marland Kitchen... 1 by mouth qd  Orders: Cardiolite (Cardiolite)  Complete Medication List: 1)  B Complete Tabs (B complex-biotin-fa) .... Once daily 2)  Crestor 10 Mg Tabs (Rosuvastatin calcium) .... Tale one  one day skip 2 days and then repeat over and over 3)  Vitamin D3 1000 Unit Tabs (Cholecalciferol) .... Two times a day 4)  Aspir-low 81 Mg Tbec (Aspirin) .Marland Kitchen.. 1 once daily pc 5)  Prilosec Otc 20 Mg Tbec (Omeprazole magnesium) .Marland Kitchen.. 1 qd 6)  Lovaza 1 Gm Caps (Omega-3-acid ethyl esters) .... Take 2 by mouth two times a day 7)  Mobic 7.5 Mg Tabs (Meloxicam) .Marland Kitchen.. 1 by mouth once daily x 7 days, then as needed for pain 8)  Amlodipine Besylate 10 Mg Tabs (Amlodipine besylate) .Marland Kitchen.. 1 by mouth once daily for blood pressure 9)  Lisinopril 40 Mg Tabs (Lisinopril) .Marland Kitchen.. 1 by mouth qd 10)  Alprazolam 0.5 Mg Tabs (Alprazolam) .Marland Kitchen.. 1 by mouth two times a day as needed anxiety 11)  Nitrostat 0.4 Mg Subl (Nitroglycerin) .... One under the tongue q 5 mins. as needed total of 3 dosages in 15 mins.  Patient Instructions: 1)  Call if you are not better in a reasonable amount of time or if worse. Go to ER if feeling really bad!  2)  Take extra Prilosec daily x 1-2 wks 3)  Do not eat dry food late - yogurt or other soft food would work better.  Prescriptions: NITROSTAT 0.4 MG SUBL (NITROGLYCERIN) one under the tongue q 5 mins. as needed total of 3 dosages in 15 mins.  #20 x 3   Entered and Authorized by:   Tresa Garter MD   Signed by:   Tresa Garter MD on 01/24/2010   Method used:   Print  then Give to Patient   RxID:   [id number] ALPRAZOLAM 0.5 MG TABS (ALPRAZOLAM) 1 by mouth two times a day as needed anxiety  #30 x 1   Entered and Authorized by:   Tresa Garter MD   Signed by:   Tresa Garter MD on 01/24/2010   Method used:   Print then Give to Patient   RxID:   [id number]

## 2010-08-21 NOTE — Progress Notes (Signed)
Summary: MED ?'s  Phone Note Call from Patient   Summary of Call: 1.Pt has amlodipine on her med list but does not have rx for this. See stacey's comments - pt stated she was not taking that med, should she be? 2.Aciphex given today was not on med list, should she take this?  Initial call taken by: Lamar Sprinkles, CMA,  July 09, 2010 4:26 PM  Follow-up for Phone Call        Take Aciphex She was supposed to be on Amlodipine. Start with 1/2 tab x 2 d, then 1 a day. Hold water pill (triamt-hct) - you are dehydrated according to today's labs. Push fluids. Keep return office visit  Thank you!   Follow-up by: Tresa Garter MD,  July 09, 2010 6:01 PM  Additional Follow-up for Phone Call Additional follow up Details #1::        pt informed of above Additional Follow-up by: Lanier Prude, [facility]),  July 10, 2010 9:04 AM    Prescriptions: AMLODIPINE BESYLATE 10 MG TABS (AMLODIPINE BESYLATE) 1 by mouth once daily for blood pressure  #30 x 5   Entered by:   Lanier Prude, CMA(AAMA)   Authorized by:   Tresa Garter MD   Signed by:   Lanier Prude, CMA(AAMA) on 07/10/2010   Method used:   Electronically to        [id number] (retail)       [address]       [place], [place]  [address]       Ph: [phone]       Fax: [phone]   RxID:   [id number]

## 2010-08-21 NOTE — Letter (Signed)
Summary: [facility]   Imported By: Lester Chadron 08/29/2009 09:04:49  _____________________________________________________________________  External Attachment:    Type:   Image     Comment:   External Document

## 2010-08-21 NOTE — Assessment & Plan Note (Signed)
Summary: 3 MO [name] /NWS  #   Vital Signs:  Patient profile:   77 year old female Menstrual status:  postmenopausal Height:      66 inches Weight:      128 pounds BMI:     20.73 Temp:     97.8 degrees F oral Pulse rate:   80 / minute Pulse rhythm:   irregular Resp:     16 per minute BP sitting:   150 / 98  (left arm) Cuff size:   regular  Vitals Entered By: Lanier Prude, CMA(AAMA) (July 09, 2010 1:25 PM) CC: 3 mo f/u  c/o nausea,decreased appetite and fatigue Is Patient Diabetic? No CBG Result 116 Comments pt is not taking Mobic or Amlodipine   Primary Care Tremar Wickens:  Georgina Quint Plotnikov MD  CC:  3 mo f/u  c/o nausea and decreased appetite and fatigue.  History of Present Illness: C/o nausea, decreased appetite, diffuse intermittent and not related to meals dull abd pain and weakness, high BP with SBP 140-180. C/o anxiety and insomnia.   -  Date:  07/07/2010    BUN: 23    Creatinine: 1.22    Sodium: 136    Potassium: 5.3    Chloride: 99    CO2 Total: 27    Calcium: 10.8    GLU 83    BMP Comment: Labs from [facility] & vascular  Current Medications (verified): 1)  B Complete   Tabs (B Complex-Biotin-Fa) .... Once Daily 2)  Crestor 10 Mg  Tabs (Rosuvastatin Calcium) .... Tale One  One Day Skip 2 Days and Then Repeat Over and Over 3)  Vitamin D3 1000 Unit  Tabs (Cholecalciferol) .... Two Times A Day 4)  Aspir-Low 81 Mg Tbec (Aspirin) .Marland Kitchen.. 1 Once Daily Pc 5)  Prilosec Otc 20 Mg Tbec (Omeprazole Magnesium) .Marland Kitchen.. 1 Qd 6)  Lovaza 1 Gm  Caps (Omega-3-Acid Ethyl Esters) .... Take 2 By Mouth Two Times A Day 7)  Mobic 7.5 Mg Tabs (Meloxicam) .Marland Kitchen.. 1 By Mouth Once Daily X 7 Days, Then As Needed For Pain 8)  Amlodipine Besylate 10 Mg Tabs (Amlodipine Besylate) .Marland Kitchen.. 1 By Mouth Once Daily For Blood Pressure 9)  Lisinopril 40 Mg Tabs (Lisinopril) .Marland Kitchen.. 1 By Mouth Qd 10)  Alprazolam 0.5 Mg Tabs (Alprazolam) .Marland Kitchen.. 1 By Mouth Two Times A Day As Needed Anxiety 11)   Nitrostat 0.4 Mg Subl (Nitroglycerin) .... One Under The Tongue Q 5 Mins. As Needed Total of 3 Dosages in 15 Mins. 12)  Dyazide 37.5-25 Mg Caps (Triamterene-Hctz) .Marland Kitchen.. 1 By Mouth Once Daily 13)  Amiodarone Hcl 200 Mg Tabs (Amiodarone Hcl) .Marland Kitchen.. 1 By Mouth Once Daily 14)  Cartia Xt 120 Mg Xr24h-Cap (Diltiazem Hcl Coated Beads) .Marland Kitchen.. 1 By Mouth Once Daily 15)  Coumadin 5 Mg Tabs (Warfarin Sodium) .... As Directed 16)  Ibuprofen 200 Mg Tabs (Ibuprofen) .... As Needed  Allergies (verified): 1)  ! Phenergan 2)  ! Vytorin (Ezetimibe-Simvastatin) 3)  ! Promethazine Hcl (Promethazine Hcl) 4)  ! Microzide (Hydrochlorothiazide)  Past History:  Past Surgical History: Last updated: 02/26/2007 Cholecystectomy Colostomy  Social History: Last updated: 03/11/2009 Retired Single Former Smoker Regular exercise-yes - Curves  Past Medical History: Colonic polyps, hx of COPD Diverticulosis, colon GERD Dr Kinnie Scales Hyperlipidemia w/ poor tolerance of statins Hypertension Peripheral vascular disease  Dr Lynnea Ferrier SBO 2008, 2009 due to adhesions Mesenteric 2 cm mass Dr Marisa Severin Dr Thomasena Edis Anxiety Atrial fibrillation 2011  Review of Systems  The patient complains of anorexia, weight loss, and abdominal pain.  The patient denies fever, chest pain, syncope, dyspnea on exertion, melena, and hematochezia.    Physical Exam  General:  alert, thin, cooperative to examination, and good hygiene.   Head:  normocephalic, atraumatic, no abnormalities observed, and no abnormalities palpated.   Eyes:  vision grossly intact, pupils equal, pupils round, pupils reactive to light, pupils react to accomodation, no retinal abnormalitiies, and no nystagmus.   Ears:  R ear normal and L ear normal.   Mouth:  Oral mucosa and oropharynx without lesions or exudates.  Teeth in good repair. Not dry Neck:  supple, full ROM, no masses, no thyromegaly, no thyroid nodules or tenderness, no JVD, normal carotid upstroke,  no carotid bruits, and no cervical lymphadenopathy.   Lungs:  normal respiratory effort, no intercostal retractions, no accessory muscle use, normal breath sounds, no dullness, no fremitus, no crackles, and no wheezes.   Heart:   no gallop, no rub, no JVD, tachycardia, and Grade  2/6 systolic ejection murmur over RUSB.  no gallop, no rub, no JVD, tachycardia, irregular rhythm. Abdomen:  soft, non-tender, normal bowel sounds, no distention, no masses, no guarding, no rigidity, no rebound tenderness, no abdominal hernia, no inguinal hernia, no hepatomegaly, and no splenomegaly.   Msk:  normal ROM, no joint tenderness, no joint swelling, no joint warmth, no redness over joints, no joint deformities, no joint instability, no crepitation, and no muscle atrophy.   Neurologic:  alert & oriented X3, cranial nerves II-XII intact, strength normal in all extremities, sensation intact to light touch, sensation intact to pinprick, gait normal, DTRs symmetrical and normal, finger-to-nose normal, heel-to-shin normal, toes down bilaterally on Babinski, and Romberg negative.   Skin:  turgor normal, color normal, no rashes, no suspicious lesions, no ecchymoses, no petechiae, no purpura, no ulcerations, and no edema.   Psych:  Oriented X3, memory intact for recent and remote, normally interactive, not depressed appearing, and slightly anxious.     Impression & Recommendations:  Problem # 1:  NAUSEA (ICD-787.02) poss. med related Assessment New See "Patient Instructions".  Orders: TLB-BMP (Basic Metabolic Panel-BMET) (80048-METABOL) TLB-CBC Platelet - w/Differential (85025-CBCD) TLB-Hepatic/Liver Function Pnl (80076-HEPATIC) TLB-Lipase (83690-LIPASE) TLB-Sedimentation Rate (ESR) (85652-ESR) TLB-Udip ONLY (81003-UDIP)  Problem # 2:  DYSPHAGIA UNSPECIFIED (ICD-787.20) poss strictures Assessment: New Start Aciphex bid Orders: Gastroenterology Referral (GI) TLB-BMP (Basic Metabolic Panel-BMET)  (80048-METABOL) TLB-CBC Platelet - w/Differential (85025-CBCD) TLB-Hepatic/Liver Function Pnl (80076-HEPATIC) TLB-Lipase (83690-LIPASE) TLB-Sedimentation Rate (ESR) (85652-ESR) TLB-Udip ONLY (81003-UDIP) TLB-H. Pylori Abs(Helicobacter Pylori) (86677-HELICO)  Problem # 3:  WEIGHT LOSS (ICD-783.21) Assessment: New  Orders: Gastroenterology Referral (GI) TLB-BMP (Basic Metabolic Panel-BMET) (80048-METABOL) TLB-CBC Platelet - w/Differential (85025-CBCD) TLB-Hepatic/Liver Function Pnl (80076-HEPATIC) TLB-Lipase (83690-LIPASE) TLB-Sedimentation Rate (ESR) (85652-ESR) TLB-Udip ONLY (81003-UDIP)  Problem # 4:  HYPERTENSION (ICD-401.9) Assessment: Deteriorated  Her updated medication list for this problem includes:    Amlodipine Besylate 10 Mg Tabs (Amlodipine besylate) .Marland Kitchen... 1 by mouth once daily for blood pressure    Lisinopril 40 Mg Tabs (Lisinopril) .Marland Kitchen... 1 by mouth qd    Dyazide 37.5-25 Mg Caps (Triamterene-hctz) .Marland Kitchen... 1 by mouth once daily    Cartia Xt 120 Mg Xr24h-cap (Diltiazem hcl coated beads) .Marland Kitchen... 1 by mouth once daily  Problem # 5:  ABDOMINAL PAIN, GENERALIZED (ICD-789.07) Assessment: Unchanged Discussed. Recent Hospital records/tests discussed and reviewed  Complete Medication List: 1)  B Complete Tabs (B complex-biotin-fa) .... Once daily 2)  Crestor 10 Mg Tabs (Rosuvastatin calcium) .... Tale one  one  day skip 2 days and then repeat over and over 3)  Vitamin D3 1000 Unit Tabs (Cholecalciferol) .... Two times a day 4)  Aspir-low 81 Mg Tbec (Aspirin) .Marland Kitchen.. 1 once daily pc 5)  Prilosec Otc 20 Mg Tbec (Omeprazole magnesium) .Marland Kitchen.. 1 qd 6)  Lovaza 1 Gm Caps (Omega-3-acid ethyl esters) .... Take 2 by mouth two times a day 7)  Mobic 7.5 Mg Tabs (Meloxicam) .Marland Kitchen.. 1 by mouth once daily x 7 days, then as needed for pain 8)  Amlodipine Besylate 10 Mg Tabs (Amlodipine besylate) .Marland Kitchen.. 1 by mouth once daily for blood pressure 9)  Lisinopril 40 Mg Tabs (Lisinopril) .Marland Kitchen.. 1 by mouth  qd 10)  Alprazolam 0.5 Mg Tabs (Alprazolam) .Marland Kitchen.. 1 by mouth two three times a day as needed anxiety 11)  Nitrostat 0.4 Mg Subl (Nitroglycerin) .... One under the tongue q 5 mins. as needed total of 3 dosages in 15 mins. 12)  Dyazide 37.5-25 Mg Caps (Triamterene-hctz) .Marland Kitchen.. 1 by mouth once daily 13)  Amiodarone Hcl 200 Mg Tabs (Amiodarone hcl) .Marland Kitchen.. 1 by mouth once daily 14)  Cartia Xt 120 Mg Xr24h-cap (Diltiazem hcl coated beads) .Marland Kitchen.. 1 by mouth once daily 15)  Coumadin 5 Mg Tabs (Warfarin sodium) .... As directed  Other Orders: Capillary Blood Glucose/CBG (98119)  Patient Instructions: 1)  Hold Amiodarone, Fish oil, Crestor, B complex, Prilosec, Ibuprofen 2)  Take Cartia XT twice a day 3)  Take Aciphex 20 mg two times a day  4)  Please schedule a follow-up appointment in 1 week. Prescriptions: ALPRAZOLAM 0.5 MG TABS (ALPRAZOLAM) 1 by mouth two three times a day as needed anxiety  #90 x 3   Entered and Authorized by:   Tresa Garter MD   Signed by:   Lanier Prude, CMA(AAMA) on 07/09/2010   Method used:     RxID:   [id number]    Orders Added: 1)  Capillary Blood Glucose/CBG [84696] 2)  Est. Patient Level V [29528] 3)  Gastroenterology Referral [GI] 4)  TLB-BMP (Basic Metabolic Panel-BMET) [80048-METABOL] 5)  TLB-CBC Platelet - w/Differential [85025-CBCD] 6)  TLB-Hepatic/Liver Function Pnl [80076-HEPATIC] 7)  TLB-Lipase [83690-LIPASE] 8)  TLB-Sedimentation Rate (ESR) [85652-ESR] 9)  TLB-Udip ONLY [81003-UDIP] 10)  TLB-H. Pylori Abs(Helicobacter Pylori) [86677-HELICO]

## 2010-08-21 NOTE — Progress Notes (Signed)
  Phone Note Call from Patient   Summary of Call: Pt has lab order from Dr Lynnea Ferrier at [facility]. Advised her if she brought in lab order we would put order in, pt will come in today.  Initial call taken by: Lamar Sprinkles, CMA,  May 27, 2010 12:33 PM

## 2010-08-21 NOTE — Consult Note (Signed)
Summary: [facility]   Imported By: Sherian Rein 06/05/2010 10:52:15  _____________________________________________________________________  External Attachment:    Type:   Image     Comment:   External Document

## 2010-08-21 NOTE — Letter (Signed)
Summary: [facility]  [facility]   Imported By: Lester [place] 09/07/2009 11:32:45  _____________________________________________________________________  External Attachment:    Type:   Image     Comment:   External Document

## 2010-08-21 NOTE — Letter (Signed)
Summary: [facility]  [facility]   Imported By: Lennie Odor 06/24/2010 12:00:51  _____________________________________________________________________  External Attachment:    Type:   Image     Comment:   External Document

## 2010-08-21 NOTE — Assessment & Plan Note (Signed)
Summary: 3 MO [name] /NWS #   Vital Signs:  Patient profile:   77 year old female Weight:      142 pounds Temp:     98.5 degrees F oral Pulse rate:   72 / minute BP sitting:   144 / 62  (left arm)  Vitals Entered By: Tora Perches (August 13, 2009 10:29 AM) CC: f/u Is Patient Diabetic? No   Primary Care Provider:  Tresa Garter MD  CC:  f/u.  History of Present Illness: The patient presents for a follow up of hypertension, PVD, elev. glu, hyperlipidemia   Preventive Screening-Counseling & Management  Alcohol-Tobacco     Smoking Status: quit  Current Medications (verified): 1)  Lotrel 10-20 Mg  Caps (Amlodipine Besy-Benazepril Hcl) .... Once Daily 2)  B Complete   Tabs (B Complex-Biotin-Fa) .... Once Daily 3)  Crestor 10 Mg  Tabs (Rosuvastatin Calcium) .... Tale One  One Day Skip 2 Days and Then Repeat Over and Over 4)  Vitamin D3 1000 Unit  Tabs (Cholecalciferol) .... Two Times A Day 5)  Aspir-Low 81 Mg Tbec (Aspirin) .Marland Kitchen.. 1 Once Daily Pc 6)  Prilosec Otc 20 Mg Tbec (Omeprazole Magnesium) .Marland Kitchen.. 1 Qd 7)  Lovaza 1 Gm  Caps (Omega-3-Acid Ethyl Esters) .... Take 2 By Mouth Two Times A Day 8)  Mobic 7.5 Mg Tabs (Meloxicam) .Marland Kitchen.. 1 By Mouth Once Daily X 7 Days, Then As Needed For Pain  Allergies: 1)  ! Phenergan 2)  ! Vytorin (Ezetimibe-Simvastatin) 3)  ! Promethazine Hcl (Promethazine Hcl) 4)  ! Microzide (Hydrochlorothiazide)  Past History:  Past Medical History: Last updated: 01/08/2009 Colonic polyps, hx of COPD Diverticulosis, colon GERD Hyperlipidemia w/ poor tolerance of statins Hypertension Peripheral vascular disease  Dr Beulah Gandy SBO 2008, 2009 due to adhesions Mesenteric 2 cm mass Dr Marisa Severin Dr Thomasena Edis  Social History: Last updated: 03/11/2009 Retired Single Former Smoker Regular exercise-yes - [employer]  Review of Systems  The patient denies fever, weight loss, weight gain, vision loss, chest pain, syncope, dyspnea on exertion, and  headaches.    Physical Exam  General:  alert, well-developed, well-nourished, and cooperative to examination.  Nose:  WNL Mouth:  WNL Neck:  Cervical spine is not  tender to palpation over paraspinal muscles and with the ROM  Lungs:  normal respiratory effort, no intercostal retractions or use of accessory muscles; normal breath sounds bilaterally - no crackles and no wheezes.    Heart:  normal rate, regular rhythm, no murmur, and no rub. BLE without edema. Abdomen:  S/NT Msk:  Lumbar-sacral spine is less  tender to palpation over paraspinal muscles and painfull with the ROM  R ischial bone is tender to palp Neurologic:  No cranial nerve deficits noted. Station and gait are normal. Plantar reflexes are down-going bilaterally. DTRs are symmetrical throughout. Sensory, motor and coordinative functions appear intact. Strait leg elev (-) B Skin:  smaller hematoma on left forehead (1.5 cm)- gading bruise down left side of face almost gone Psych:  Oriented X3, memory intact for recent and remote, normally interactive, good eye contact, not anxious appearing, not depressed appearing, and not agitated.      Impression & Recommendations:  Problem # 1:  CONTUSION OF FACE SCALP AND NECK EXCEPT EYE (ICD-920) Assessment Improved  Problem # 2:  DIZZINESS (ICD-780.4) Assessment: Comment Only Resolved  Problem # 3:  HYPERTENSION (ICD-401.9) Assessment: Unchanged  Her updated medication list for this problem includes:    Lotrel 10-20 Mg Caps (  Amlodipine besy-benazepril hcl) ..... Once daily  Problem # 4:  HYPERLIPIDEMIA (ICD-272.4) Assessment: Unchanged  Her updated medication list for this problem includes:    Crestor 10 Mg Tabs (Rosuvastatin calcium) .Marland Kitchen... Tale one  one day skip 2 days and then repeat over and over    Lovaza 1 Gm Caps (Omega-3-acid ethyl esters) .Marland Kitchen... Take 2 by mouth two times a day  Problem # 5:  GERD (ICD-530.81) Assessment: Unchanged  Her updated medication list for  this problem includes:    Prilosec Otc 20 Mg Tbec (Omeprazole magnesium) .Marland Kitchen... 1 qd  Complete Medication List: 1)  Lotrel 10-20 Mg Caps (Amlodipine besy-benazepril hcl) .... Once daily 2)  B Complete Tabs (B complex-biotin-fa) .... Once daily 3)  Crestor 10 Mg Tabs (Rosuvastatin calcium) .... Tale one  one day skip 2 days and then repeat over and over 4)  Vitamin D3 1000 Unit Tabs (Cholecalciferol) .... Two times a day 5)  Aspir-low 81 Mg Tbec (Aspirin) .Marland Kitchen.. 1 once daily pc 6)  Prilosec Otc 20 Mg Tbec (Omeprazole magnesium) .Marland Kitchen.. 1 qd 7)  Lovaza 1 Gm Caps (Omega-3-acid ethyl esters) .... Take 2 by mouth two times a day 8)  Mobic 7.5 Mg Tabs (Meloxicam) .Marland Kitchen.. 1 by mouth once daily x 7 days, then as needed for pain  Patient Instructions: 1)  Please schedule a follow-up appointment in 4 months. 2)  BMP prior to visit, ICD-9: 3)  Hepatic Panel prior to visit, ICD-9: 4)  Lipid Panel prior to visit, ICD-9:272.0  729.5  790.29 5)  TSH prior to visit, ICD-9: 6)  HbgA1C prior to visit, ICD-9:

## 2010-08-21 NOTE — Assessment & Plan Note (Signed)
Summary: blood pressure issues/per plot need to be seen/lb   Vital Signs:  Patient profile:   77 year old female Menstrual status:  postmenopausal Height:      66 inches Weight:      136 pounds O2 Sat:      91 % on Room air Temp:     98.1 degrees F oral Pulse rate:   110 / minute Pulse rhythm:   regular Resp:     16 per minute BP sitting:   152 / 70  (left arm) Cuff size:   regular  O2 Flow:  Room air  Primary Care Provider:  Tresa Garter MD   History of Present Illness: New to me she comes in to be seen urgently after she had a spell about 2 hours ago. She was eating lunch at a drug store and there was a glare in her eyes and she developed lightheadedness, blurred vision, and facial flushing. She got in her car and drove for about 20 minutes to see her daughter who brough her here. Her symptoms have since resolved. She did not have any numbness, weakness, or tingling and she did not have headache, nausea, vomiting, slurred speech, or ataxia. She had had a normal day prior to this eating a full breakfast and exercising at [employer]. She has felt this way before and it has usually been a spike in her blood pressure. She sees a Development worker, international aid regularly and her medical chart includes a recent normal P thallium done . She thinks she has had a murmur before.  Allergies: 1)  ! Phenergan 2)  ! Vytorin (Ezetimibe-Simvastatin) 3)  ! Promethazine Hcl (Promethazine Hcl) 4)  ! Microzide (Hydrochlorothiazide)  Past History:  Past Medical History: Last updated: 01/24/2010 Colonic polyps, hx of COPD Diverticulosis, colon GERD Hyperlipidemia w/ poor tolerance of statins Hypertension Peripheral vascular disease  Dr Lynnea Ferrier SBO 2008, 2009 due to adhesions Mesenteric 2 cm mass Dr Marisa Severin Dr Thomasena Edis Anxiety  Past Surgical History: Last updated: 02/26/2007 Cholecystectomy Colostomy  Family History: Last updated: 05/19/2007 Family History of CAD Female 1st degree relative  <50 Family History Hypertension  Social History: Last updated: 03/11/2009 Retired Single Former Smoker Regular exercise-yes - [employer]  Risk Factors: Alcohol Use: 0 (04/08/2010) Exercise: yes (05/15/2008)  Risk Factors: Smoking Status: quit (04/08/2010)  Family History: Reviewed history from 05/19/2007 and no changes required. Family History of CAD Female 1st degree relative <50 Family History Hypertension  Social History: Reviewed history from 03/11/2009 and no changes required. Retired Single Former Smoker Regular exercise-yes - [employer]  Review of Systems  The patient denies anorexia, fever, weight loss, weight gain, chest pain, syncope, dyspnea on exertion, peripheral edema, prolonged cough, headaches, hemoptysis, and abdominal pain.   CV:  Complains of near fainting; denies chest pain or discomfort, difficulty breathing while lying down, fainting, fatigue, leg cramps with exertion, lightheadness, palpitations, shortness of breath with exertion, and swelling of feet.  Physical Exam  General:  alert, well-developed, well-nourished, well-hydrated, appropriate dress, normal appearance, healthy-appearing, cooperative to examination, and good hygiene.   Head:  normocephalic, atraumatic, no abnormalities observed, and no abnormalities palpated.   Eyes:  vision grossly intact, pupils equal, pupils round, pupils reactive to light, pupils react to accomodation, no retinal abnormalitiies, and no nystagmus.   Ears:  R ear normal and L ear normal.   Nose:  External nasal examination shows no deformity or inflammation. Nasal mucosa are pink and moist without lesions or exudates. Mouth:  Oral mucosa and oropharynx  without lesions or exudates.  Teeth in good repair. Neck:  supple, full ROM, no masses, no thyromegaly, no thyroid nodules or tenderness, no JVD, normal carotid upstroke, no carotid bruits, and no cervical lymphadenopathy.   Lungs:  normal respiratory effort, no intercostal  retractions, no accessory muscle use, normal breath sounds, no dullness, no fremitus, no crackles, and no wheezes.   Heart:   no gallop, no rub, no JVD, tachycardia, and Grade  2/6 systolic ejection murmur over RUSB.  no gallop, no rub, no JVD, tachycardia, irregular rhythm. Abdomen:  soft, non-tender, normal bowel sounds, no distention, no masses, no guarding, no rigidity, no rebound tenderness, no abdominal hernia, no inguinal hernia, no hepatomegaly, and no splenomegaly.   Msk:  normal ROM, no joint tenderness, no joint swelling, no joint warmth, no redness over joints, no joint deformities, no joint instability, no crepitation, and no muscle atrophy.   Pulses:  R and L carotid,radial,femoral,dorsalis pedis and posterior tibial pulses are full and equal bilaterally Extremities:  No clubbing, cyanosis, edema, or deformity noted with normal full range of motion of all joints.   Neurologic:  alert & oriented X3, cranial nerves II-XII intact, strength normal in all extremities, sensation intact to light touch, sensation intact to pinprick, gait normal, DTRs symmetrical and normal, finger-to-nose normal, heel-to-shin normal, toes down bilaterally on Babinski, and Romberg negative.   Skin:  turgor normal, color normal, no rashes, no suspicious lesions, no ecchymoses, no petechiae, no purpura, no ulcerations, and no edema.   Psych:  Cognition and judgment appear intact. Alert and cooperative with normal attention span and concentration. No apparent delusions, illusions, hallucinations   Impression & Recommendations:  Problem # 1:  ATRIAL FIBRILLATION (ICD-427.31) Assessment New  she will see dr Lynnea Ferrier today, I called him and made him aware of her condition Her updated medication list for this problem includes:    Aspir-low 81 Mg Tbec (Aspirin) .Marland Kitchen... 1 once daily pc    Amlodipine Besylate 10 Mg Tabs (Amlodipine besylate) .Marland Kitchen... 1 by mouth once daily for blood pressure  Orders: EKG w/ Interpretation  (93000)  Problem # 2:  AORTIC STENOSIS (ICD-424.1) Assessment: New  Her updated medication list for this problem includes:    Aspir-low 81 Mg Tbec (Aspirin) .Marland Kitchen... 1 once daily pc  Orders: EKG w/ Interpretation (93000)  Problem # 3:  ESSENTIAL HYPERTENSION, BENIGN (ICD-401.1) Assessment: Deteriorated  Her updated medication list for this problem includes:    Amlodipine Besylate 10 Mg Tabs (Amlodipine besylate) .Marland Kitchen... 1 by mouth once daily for blood pressure    Lisinopril 40 Mg Tabs (Lisinopril) .Marland Kitchen... 1 by mouth qd  BP today: 152/70 Prior BP: 120/70 (04/08/2010)  Labs Reviewed: K+: 4.8 (04/04/2010) Creat: : 0.7 (04/04/2010)   Chol: 196 (04/04/2010)   HDL: 48.40 (04/04/2010)   LDL: 121 (04/04/2010)   TG: 133.0 (04/04/2010)  Complete Medication List: 1)  B Complete Tabs (B complex-biotin-fa) .... Once daily 2)  Crestor 10 Mg Tabs (Rosuvastatin calcium) .... Tale one  one day skip 2 days and then repeat over and over 3)  Vitamin D3 1000 Unit Tabs (Cholecalciferol) .... Two times a day 4)  Aspir-low 81 Mg Tbec (Aspirin) .Marland Kitchen.. 1 once daily pc 5)  Prilosec Otc 20 Mg Tbec (Omeprazole magnesium) .Marland Kitchen.. 1 qd 6)  Lovaza 1 Gm Caps (Omega-3-acid ethyl esters) .... Take 2 by mouth two times a day 7)  Mobic 7.5 Mg Tabs (Meloxicam) .Marland Kitchen.. 1 by mouth once daily x 7 days, then as needed for pain  8)  Amlodipine Besylate 10 Mg Tabs (Amlodipine besylate) .Marland Kitchen.. 1 by mouth once daily for blood pressure 9)  Lisinopril 40 Mg Tabs (Lisinopril) .Marland Kitchen.. 1 by mouth qd 10)  Alprazolam 0.5 Mg Tabs (Alprazolam) .Marland Kitchen.. 1 by mouth two times a day as needed anxiety 11)  Nitrostat 0.4 Mg Subl (Nitroglycerin) .... One under the tongue q 5 mins. as needed total of 3 dosages in 15 mins.  Patient Instructions: 1)  Please schedule a follow-up appointment in 2 weeks. 2)  Check your Blood Pressure regularly. If it is above 130/80: you should make an appointment.   Orders Added: 1)  Est. Patient Level IV [62130] 2)  EKG w/  Interpretation [93000]

## 2010-08-21 NOTE — Assessment & Plan Note (Signed)
Summary: 4 mo rov /nws  #   Vital Signs:  Patient profile:   77 year old female Height:      66 inches Weight:      137 pounds BMI:     22.19 Temp:     99.0 degrees F oral Pulse rate:   84 / minute Pulse rhythm:   regular Resp:     16 per minute BP sitting:   120 / 70  (left arm) Cuff size:   regular  Vitals Entered By: Lanier Prude, CMA(AAMA) (April 08, 2010 2:35 PM) CC: 4 mo f/u Is Patient Diabetic? No   Primary Care Provider:  Tresa Garter MD  CC:  4 mo f/u.  History of Present Illness: The patient presents for a preventive health examination  Patient past medical history, social history, and family history reviewed in detail no significant changes.  Patient is physically active. Depression is negative and mood is good. Hearing is normal, and able to perform activities of daily living. Risk of falling is negligible and home safety has been reviewed and is appropriate. Patient has normal height, weight, and visual acuity. Patient has been counseled on age-appropriate routine health concerns for screening and prevention. Education, counseling done.  The patient presents for a follow up of hypertension, PVD, hyperlipidemia   Preventive Screening-Counseling & Management  Alcohol-Tobacco     Alcohol drinks/day: 0     Smoking Status: quit  Caffeine-Diet-Exercise     Caffeine Counseling: not indicated; caffeine use is not excessive or problematic     Nutrition Referrals: no     Depression Counseling: not indicated; screening negative for depression  Safety-Violence-Falls     Seat Belt Use: yes     Fall Risk Counseling: not indicated; no significant falls noted  Current Medications (verified): 1)  B Complete   Tabs (B Complex-Biotin-Fa) .... Once Daily 2)  Crestor 10 Mg  Tabs (Rosuvastatin Calcium) .... Tale One  One Day Skip 2 Days and Then Repeat Over and Over 3)  Vitamin D3 1000 Unit  Tabs (Cholecalciferol) .... Two Times A Day 4)  Aspir-Low 81 Mg Tbec  (Aspirin) .Marland Kitchen.. 1 Once Daily Pc 5)  Prilosec Otc 20 Mg Tbec (Omeprazole Magnesium) .Marland Kitchen.. 1 Qd 6)  Lovaza 1 Gm  Caps (Omega-3-Acid Ethyl Esters) .... Take 2 By Mouth Two Times A Day 7)  Mobic 7.5 Mg Tabs (Meloxicam) .Marland Kitchen.. 1 By Mouth Once Daily X 7 Days, Then As Needed For Pain 8)  Amlodipine Besylate 10 Mg Tabs (Amlodipine Besylate) .Marland Kitchen.. 1 By Mouth Once Daily For Blood Pressure 9)  Lisinopril 40 Mg Tabs (Lisinopril) .Marland Kitchen.. 1 By Mouth Qd 10)  Alprazolam 0.5 Mg Tabs (Alprazolam) .Marland Kitchen.. 1 By Mouth Two Times A Day As Needed Anxiety 11)  Nitrostat 0.4 Mg Subl (Nitroglycerin) .... One Under The Tongue Q 5 Mins. As Needed Total of 3 Dosages in 15 Mins.  Allergies (verified): 1)  ! Phenergan 2)  ! Vytorin (Ezetimibe-Simvastatin) 3)  ! Promethazine Hcl (Promethazine Hcl) 4)  ! Microzide (Hydrochlorothiazide)  Past History:  Past Medical History: Last updated: 01/24/2010 Colonic polyps, hx of COPD Diverticulosis, colon GERD Hyperlipidemia w/ poor tolerance of statins Hypertension Peripheral vascular disease  Dr Lynnea Ferrier SBO 2008, 2009 due to adhesions Mesenteric 2 cm mass Dr Marisa Severin Dr Thomasena Edis Anxiety  Past Surgical History: Last updated: 02/26/2007 Cholecystectomy Colostomy  Family History: Last updated: 05/19/2007 Family History of CAD Female 1st degree relative <50 Family History Hypertension  Social History: Last updated: 03/11/2009  Retired Single Former Smoker Regular exercise-yes - [employer]  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    Physical Exam  General:  alert, well-developed, well-nourished, and cooperative to examination.  Head:  WNL Eyes:  vision grossly intact; pupils equal, round and reactive to light.  conjunctiva and lids normal.    Ears:  WNL Nose:  WNL Mouth:  Oral mucosa and oropharynx without lesions or exudates.  Teeth in good repair. Neck:  Cervical spine is not  tender to palpation over paraspinal muscles and with the ROM  Lungs:  normal respiratory effort, no intercostal retractions or use of accessory muscles; normal breath sounds bilaterally - no crackles and no wheezes.    Heart:  normal rate, regular rhythm, 2/6 murmur , and no rub. BLE without edema. Abdomen:  S/NT Msk:  Lumbar-sacral spine is less  tender to palpation over paraspinal muscles and painfull with the ROM   Pulses:  symm. Extremities:  No edema Neurologic:  No cranial nerve deficits noted. Station and gait are normal. Plantar reflexes are down-going bilaterally. DTRs are symmetrical throughout. Sensory, motor and coordinative functions appear intact. Strait leg elev (-) B Skin:  bruises under several toenails Cervical Nodes:  No lymphadenopathy noted Axillary Nodes:  No palpable lymphadenopathy Inguinal Nodes:  No significant adenopathy Psych:  Cognition and judgment appear intact. Alert and cooperative with normal attention span and concentration. No apparent delusions, illusions, hallucinations   Impression & Recommendations:  Problem # 1:  HEALTH MAINTENANCE EXAM (ICD-V70.0) Assessment New  Overall doing well, age appropriate education and counseling updated and referral for appropriate preventive services done unless declined, immunizations up to date or declined, diet counseling done if overweight, urged to quit smoking if smokes, most recent labs reviewed and current ordered if appropriate, ecg reviewed or declined (interpretation per ECG scanned in  the EMR if done); information regarding Medicare Preventation requirements given if appropriate.   Orders: Medicare -1st Annual Wellness Visit [id number])  Problem # 2:  PERIPHERAL VASCULAR DISEASE (ICD-443.9) Assessment: Unchanged CL was OK  Problem # 3:  ECCHYMOSES (ICD-782.9) under toenails - due to shoes Assessment: New will watch  Problem # 4:  HYPERTENSION (ICD-401.9) Assessment: Unchanged  Her updated medication list for this problem includes:    Amlodipine Besylate 10 Mg Tabs (Amlodipine besylate) .Marland Kitchen... 1 by mouth once daily for blood pressure    Lisinopril 40 Mg Tabs (Lisinopril) .Marland Kitchen... 1 by mouth qd  Problem # 5:  HYPERLIPIDEMIA (ICD-272.4) Assessment: Unchanged  Her updated medication list for this problem includes:    Crestor 10 Mg Tabs (Rosuvastatin calcium) .Marland Kitchen... Tale one  one day skip 2 days and then repeat over and over    Lovaza 1 Gm Caps (Omega-3-acid ethyl esters) .Marland Kitchen... Take 2 by mouth two times a day  Labs Reviewed: SGOT: 17 (04/04/2010)   SGPT: 17 (04/04/2010)   HDL:48.40 (04/04/2010), 56.70 (11/29/2009)  LDL:121 (04/04/2010), 128 (05/03/2009)  Chol:196 (04/04/2010), 208 (11/29/2009)  Trig:133.0 (04/04/2010), 147.0 (11/29/2009)  Problem # 6:  UNSPECIFIED VITAMIN D DEFICIENCY (ICD-268.9) Assessment: Improved  On the regimen of medicine(s) reflected in the chart   Assessment: Unchanged  Complete Medication List: 1)  B Complete Tabs (B complex-biotin-fa) .... Once daily 2)  Crestor 10 Mg Tabs (Rosuvastatin calcium) .... Tale one  one day skip 2 days and then repeat over and over 3)  Vitamin D3 1000 Unit Tabs (Cholecalciferol) .... Two times a day 4)  Aspir-low 81 Mg Tbec (Aspirin) .Marland Kitchen.. 1 once daily pc 5)  Prilosec Otc 20 Mg Tbec (Omeprazole magnesium) .Marland Kitchen.. 1 qd 6)  Lovaza 1 Gm Caps (Omega-3-acid ethyl esters) .... Take 2 by mouth two times a day 7)  Mobic 7.5 Mg Tabs (Meloxicam) .Marland Kitchen.. 1 by mouth once daily x 7 days, then as needed for pain 8)  Amlodipine  Besylate 10 Mg Tabs (Amlodipine besylate) .Marland Kitchen.. 1 by mouth once daily for blood pressure 9)  Lisinopril 40 Mg Tabs (Lisinopril) .Marland Kitchen.. 1 by mouth qd 10)  Alprazolam 0.5 Mg Tabs (Alprazolam) .Marland Kitchen.. 1 by mouth two times a day as needed anxiety 11)  Nitrostat 0.4 Mg Subl (Nitroglycerin) .... One under the tongue q 5 mins. as needed total of 3 dosages in 15 mins.  Other Orders: Flu Vaccine 6yrs + MEDICARE PATIENTS (O1607) Administration Flu vaccine - MCR (P7106)  Patient Instructions: 1)  Please schedule a follow-up appointment in 3 months. Prescriptions: CRESTOR 10 MG  TABS (ROSUVASTATIN CALCIUM) tale one  one day skip 2 days and then repeat over and over  #30 Tablet x 11   Entered and Authorized by:   Tresa Garter MD   Signed by:   Tresa Garter MD on 04/08/2010   Method used:   Print then Give to Patient   RxID:   [id number]  Flu Vaccine Consent Questions     Do you have a history of severe allergic reactions to this vaccine? no    Any prior history of allergic reactions to egg and/or gelatin? no    Do you have a sensitivity to the preservative Thimersol? no    Do you have a past history of Guillan-Barre Syndrome? no    Do you currently have an acute febrile illness? no    Have you ever had a severe reaction to latex? no    Vaccine information given and explained to patient? yes    Are you currently pregnant? no    Lot Number:[id number]   Exp Date:01/17/2011   Site Given  Left Deltoid IM Lanier Prude, [facility])  April 08, 2010 2:44 PM      Contraindications/Deferment of Procedures/Staging:    Test/Procedure: DPT vaccine    Reason for deferment: declined

## 2010-08-25 ENCOUNTER — Other Ambulatory Visit: Payer: PRIVATE HEALTH INSURANCE

## 2010-08-25 ENCOUNTER — Encounter (INDEPENDENT_AMBULATORY_CARE_PROVIDER_SITE_OTHER): Payer: Self-pay | Admitting: *Deleted

## 2010-08-25 ENCOUNTER — Other Ambulatory Visit: Payer: Self-pay | Admitting: Internal Medicine

## 2010-08-25 DIAGNOSIS — I2589 Other forms of chronic ischemic heart disease: Secondary | ICD-10-CM

## 2010-08-25 DIAGNOSIS — I10 Essential (primary) hypertension: Secondary | ICD-10-CM

## 2010-08-25 LAB — CBC WITH DIFFERENTIAL/PLATELET
Basophils Relative: 0.4 % (ref 0.0–3.0)
HCT: 41.2 % (ref 36.0–46.0)
Hemoglobin: 13.8 g/dL (ref 12.0–15.0)
Lymphocytes Relative: 25 % (ref 12.0–46.0)
Lymphs Abs: 1.5 10*3/uL (ref 0.7–4.0)
Monocytes Relative: 8.7 % (ref 3.0–12.0)
Neutro Abs: 3.8 10*3/uL (ref 1.4–7.7)
RBC: 4.57 Mil/uL (ref 3.87–5.11)

## 2010-08-25 LAB — HEPATIC FUNCTION PANEL
ALT: 29 U/L (ref 0–35)
AST: 18 U/L (ref 0–37)
Bilirubin, Direct: 0.1 mg/dL (ref 0.0–0.3)
Total Bilirubin: 0.5 mg/dL (ref 0.3–1.2)
Total Protein: 7 g/dL (ref 6.0–8.3)

## 2010-08-25 LAB — LIPID PANEL
Cholesterol: 177 mg/dL (ref 0–200)
LDL Cholesterol: 103 mg/dL — ABNORMAL HIGH (ref 0–99)
Total CHOL/HDL Ratio: 3
Triglycerides: 99 mg/dL (ref 0.0–149.0)

## 2010-08-25 LAB — BASIC METABOLIC PANEL
BUN: 13 mg/dL (ref 6–23)
CO2: 30 mEq/L (ref 19–32)
Chloride: 103 mEq/L (ref 96–112)
Creatinine, Ser: 1 mg/dL (ref 0.4–1.2)
Potassium: 5.2 mEq/L — ABNORMAL HIGH (ref 3.5–5.1)

## 2010-08-27 ENCOUNTER — Ambulatory Visit (INDEPENDENT_AMBULATORY_CARE_PROVIDER_SITE_OTHER): Payer: Medicare Other | Admitting: Internal Medicine

## 2010-08-27 ENCOUNTER — Encounter: Payer: Self-pay | Admitting: Internal Medicine

## 2010-08-27 DIAGNOSIS — R1084 Generalized abdominal pain: Secondary | ICD-10-CM

## 2010-08-27 DIAGNOSIS — I10 Essential (primary) hypertension: Secondary | ICD-10-CM

## 2010-08-27 DIAGNOSIS — E875 Hyperkalemia: Secondary | ICD-10-CM

## 2010-08-27 DIAGNOSIS — E785 Hyperlipidemia, unspecified: Secondary | ICD-10-CM

## 2010-08-27 NOTE — Letter (Signed)
Summary: [facility]  [facility]   Imported By: Sherian Rein 08/21/2010 11:21:01  _____________________________________________________________________  External Attachment:    Type:   Image     Comment:   External Document

## 2010-08-28 DIAGNOSIS — Z860100 Personal history of colon polyps, unspecified: Secondary | ICD-10-CM | POA: Insufficient documentation

## 2010-08-28 DIAGNOSIS — K659 Peritonitis, unspecified: Secondary | ICD-10-CM | POA: Insufficient documentation

## 2010-08-28 DIAGNOSIS — K222 Esophageal obstruction: Secondary | ICD-10-CM | POA: Insufficient documentation

## 2010-09-01 ENCOUNTER — Ambulatory Visit: Payer: Self-pay | Admitting: Internal Medicine

## 2010-09-04 NOTE — Letter (Signed)
Summary: Consult Report-Dr Rolene Course  Consult Report-Dr Rolene Course   Imported By: Darcey Nora RN, CGRN 08/27/2010 13:59:20  _____________________________________________________________________  External Attachment:    Type:   Image     Comment:   External Document

## 2010-09-04 NOTE — Letter (Signed)
Summary: [facility] Office Note-Mesenteric Mass  [facility] Office Note-Mesenteric Mass   Imported By: Darcey Nora RN, CGRN 08/27/2010 14:20:46  _____________________________________________________________________  External Attachment:    Type:   Image     Comment:   External Document

## 2010-09-04 NOTE — Letter (Signed)
Summary: [facility]  [facility]   Imported By: Darcey Nora RN, CGRN 08/27/2010 14:17:42  _____________________________________________________________________  External Attachment:    Type:   Image     Comment:   External Document

## 2010-09-04 NOTE — Letter (Signed)
Summary: Operative Report-Pelvic Abscess Due to Perforated Diverticulitis  Operative Report-Pelvic Abscess Due to Perforated Diverticulitis   Imported By: Darcey Nora RN, CGRN 08/27/2010 14:00:41  _____________________________________________________________________  External Attachment:    Type:   Image     Comment:   External Document

## 2010-09-04 NOTE — Letter (Signed)
Summary: Discharge-Resection and Closure of Sigmoid Colostomy  Discharge-Resection and Closure of Sigmoid Colostomy   Imported By: Darcey Nora RN, CGRN 08/27/2010 14:03:06  _____________________________________________________________________  External Attachment:    Type:   Image     Comment:   External Document

## 2010-09-04 NOTE — Letter (Signed)
Summary: Colostomy  Colostomy   Imported By: Darcey Nora RN, CGRN 08/27/2010 13:58:38  _____________________________________________________________________  External Attachment:    Type:   Image     Comment:   External Document

## 2010-09-04 NOTE — Letter (Signed)
Summary: Gastroenterology-Dr Kinnie Scales  Gastroenterology-Dr [facility]   Imported By: Darcey Nora RN, CGRN 08/27/2010 14:01:08  _____________________________________________________________________  External Attachment:    Type:   Image     Comment:   External Document

## 2010-09-04 NOTE — Letter (Signed)
Summary: [facility] Surgery Office Note  [facility] Office Note   Imported By: Darcey Nora RN, [facility] 08/27/2010 13:59:55  _____________________________________________________________________  External Attachment:    Type:   Image     Comment:   External Document

## 2010-09-04 NOTE — Procedures (Signed)
Summary: EGD-Dr Kinnie Scales  EGD-Dr [facility]   Imported By: Darcey Nora RN, CGRN 08/27/2010 14:04:23  _____________________________________________________________________  External Attachment:    Type:   Image     Comment:   External Document

## 2010-09-04 NOTE — Consult Note (Signed)
Summary: Nausea, Vomiting, Abdominal Pain    NAME:  [name], [name]            ACCOUNT NO.:  [id number]      MEDICAL RECORD NO.:  [id number]          PATIENT TYPE:  INP      LOCATION:  1430                         FACILITY:  [name]      PHYSICIAN:  [name], M.D.DATE OF BIRTH:  06/23/1919      DATE OF CONSULTATION:  06/08/2010   DATE OF DISCHARGE:                                    CONSULTATION         CHIEF COMPLAINT:  Nausea, vomiting, and abdominal pain.      HISTORY OF PRESENT ILLNESS:  I was asked by Dr. Toniann [name] of [name] to evaluate [name].  She is an 77 year old   white female who was in her usual state of health until about 24 hours   ago.  At that time, last night she initially developed the onset of   intermittent crampy abdominal pain.  Several hours later, she vomited.   The pain has been intermittent, crampy, and fairly intense today.  She   vomited several more times.  She felt like she had normal bowel movement   and passed gas, but has been unable to since the onset of her pain.  She   presented to [name] for evaluation.  The patient   states that she had an episode of bowel obstruction about 1 year ago,   was hospitalized and this resolved quickly without surgery.  She has   some mild chronic constipation, but no major chronic GI complaints.  She   denies fever or chills.  No urinary symptoms.      PAST MEDICAL HISTORY:  Surgery is significant for colectomy for   perforated diverticulitis by [name] in 1998.  She had an incidental   appendectomy at that time.  She subsequently had colostomy takedown and   cholecystectomy by [name].  The only other operation is   tonsillectomy.  Medically, she is followed by [name] for   hypertension and elevated cholesterol.  She has recent onset of atrial   fibrillation and is seen by [name].  She is on Coumadin.      CURRENT MEDICATIONS:   1.  Crestor.   2. Amiodarone.   3. Aspirin 81 mg daily.   4. Cartia XT 120 mg daily.   5. Lisinopril 40 mg daily.   6. Coumadin specialized dosing.      ALLERGIES:  PHENERGAN causes rapid heartbeat.      SOCIAL HISTORY:  The patient is widowed.  She lives in a separate   apartment behind her daughter's house.  She remains quite active,   driving herself, and still plays golf.  She does not smoke cigarettes or   drink alcohol.      FAMILY HISTORY:  Noncontributory.      REVIEW OF SYSTEMS:  GENERAL:  No fever, chills, malaise, or weight   change.  HEENT:  Denies vision, hearing, or swallowing problems.   RESPIRATORY:  No shortness of breath, cough, or wheezing.  CARDIAC:   Occasional palpitations.  No chest pain.  No swelling.  ABDOMEN/GI:  As   above.  GU:  No urinary burning or frequency.  MUSCULOSKELETAL:   Positive for some chronic joint pain.      PHYSICAL EXAMINATION:  VITAL SIGNS:  Temperature is 98.9, respiration   20, blood pressure 130/83, and heart rate 75.   GENERAL:  She has a normal weight, fairly well appearing elderly white   female who appears younger than her stated age.   SKIN:  Warm and dry.  No rash, infection.   HEENT:  No palpable mass or thyromegaly.  Sclerae nonicteric.   LYMPH NODES:  No cervical, subclavicular, or inguinal nodes palpable.   LUNGS:  Clear without wheezing or increased work of breathing.   CARDIAC:  Rhythm is irregular.  No murmurs appreciated.  No edema.   Peripheral pulses intact.   ABDOMEN:  Moderately distended.  There are well-healed incisions without   palpable hernias.  Bowel sounds are present and high-pitched.  There is   minimal diffuse tenderness.  No guarding.  No masses or organomegaly   appreciated.      LABORATORY DATA:  Electrolytes, LFTs, lipase, and lactic acid are all   within normal limits.  INR is 1.4.  White count is 14.9 and hemoglobin   is 16.      CT scan of the abdomen and pelvis is reviewed.  This shows a small  bowel   obstruction with a transition point in the left pelvis.  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      ASSESSMENT AND PLAN:   1. Small-bowel obstruction apparently secondary to adhesions.  Agree       with the current plan of NG tube, bowel rest, and close       observation.   2. Atrial fibrillation, anticoagulated.  Coumadin is being held.   3. Mesenteric mass.  This reportedly was worked up at [facility] as well       and appears stable.      We will follow with you.               Lorne Skeens. [name], M.D.               [name]  D:  06/08/2010  T:  06/08/2010  Job:  [id number]      Electronically Signed by [name] M.D. on 06/16/2010 10:48:27 AM

## 2010-09-04 NOTE — Letter (Signed)
Summary: [facility] Office Note-Mesenteric Mass  [facility] Office Note-Mesenteric Mass   Imported By: Darcey Nora RN, CGRN 08/27/2010 14:20:14  _____________________________________________________________________  External Attachment:    Type:   Image     Comment:   External Document

## 2010-09-04 NOTE — Letter (Signed)
Summary: [facility] Office Note-Mesenteric Mass  [facility] Office Note-Mesenteric Mass   Imported By: Darcey Nora RN, CGRN 08/27/2010 14:18:07  _____________________________________________________________________  External Attachment:    Type:   Image     Comment:   External Document

## 2010-09-04 NOTE — Letter (Signed)
Summary: Barium Enema  Barium Enema   Imported By: Darcey Nora RN, CGRN 08/27/2010 14:29:35  _____________________________________________________________________  External Attachment:    Type:   Image     Comment:   External Document

## 2010-09-04 NOTE — Procedures (Signed)
Summary: COLON (Dr Kinnie Scales)   Colonoscopy  Procedure date:  02/20/2002  Findings:      Location:  [facility].    Procedures Next Due Date:    Colonoscopy: 02/2012 Patient Name: Beauchaine, [place] L. MRN: [id number] Procedure Procedures: Colonoscopy CPT: [phone].  Personnel: Endoscopist: Griffith Citron, MD, [facility].  Exam Location: Exam performed in Endoscopy Suite. Outpatient  Patient Consent: Procedure, Alternatives, Risks and Benefits discussed, consent obtained, from patient. Consent was obtained by the physician.  Indications  Evaluation of: Positive fecal occult blood test per home screening.  Surveillance of: Adenomatous Polyp(s). This is not an initial surveillance exam. Initial polypectomy was performed in 1995. in Jul. unknown Polyps were found at Index Exam. Largest polyp removed was unknown. Prior polyp located in unknown colon. Pathology of worst  polyp: tubular adenoma. Previous surveillance exam(s) in  1999,  History  Current Medications: Other: Lotrel Diuretics: Hydroclorothiazide Other: Metoprolol Other: Protonix Cholesterol Lowering Meds: Lipitor (Atorvastatin)  Medical/ Surgical History: Reflux Disease, Diverticular Disease, Hyperlipidemia, Hypertension,  Allergies: Allergic to Phenergan.  Patient Habits Patient does not smoke. Drinking Status: occasional.  Pre-Exam Physical: Performed Feb 20, 2002. Entire physical exam was normal.  Exam Exam: Extent of exam reached: Cecum, extent intended: Cecum.  The cecum was identified by appendiceal orifice and IC valve. Patient position: on left side. Duration of exam: 8 minutes. Colon retroflexion performed. Images taken. ASA Classification: II. Tolerance: excellent.  Monitoring: Pulse and BP monitoring, Oximetry used. Supplemental O2 given.  Colon Prep Used Visicol for colon prep. Dose Used: 28 tablets. Prep results: excellent.  Sedation Meds: Patient assessed and found to be appropriate for  moderate (conscious) sedation. Sedation was managed by the Endoscopist. Versed 7 mg. given IV. Fentanyl 75 mcg. given IV.  Comments: Adjustable scope used. Findings PRIOR SURGERY: Sigmoid Colon. Segmental Colectomy. Comments: s/p sigmoid colectomy for ruptured diverticulitis.    Comments: Time 1; tech 1; prep 1; Total = 3 Assessment Normal examination.  Diagnoses: V12.72: Personal Hx of Colon Polyps. .  792.1: Hemocult Positive Abnormal Stool. Probable intermittent fissure.   Comments: No colon cancer or polyp. Events  Unplanned Interventions: No intervention was required.  Unplanned Events: There were no complications. Plans  Post Exam Instructions: Post sedation instructions given. Resume previous diet: 2 hours. Restart medications: tonight.  Medication Plan: Continue current medications.  Patient Education: Patient given standard instructions for: Yearly hemoccult testing recommended. Patient instructed to get routine colonoscopy every 10 years.  Disposition: After procedure patient sent to recovery. After recovery patient sent home.  Scheduling/Referral: Follow-Up prn.

## 2010-09-04 NOTE — Letter (Signed)
Summary: [facility] Office Note-Mesenteric Mass  [facility] Office Note-Mesenteric Mass   Imported By: Darcey Nora RN, CGRN 08/27/2010 14:16:40  _____________________________________________________________________  External Attachment:    Type:   Image     Comment:   External Document

## 2010-09-04 NOTE — Letter (Signed)
Summary: Operative Report-[facility] Closure of Rectosigmoid  Operative Report-[facility] Closure of Rectosigmoid   Imported By: Darcey Nora RN, CGRN 08/27/2010 14:02:22  _____________________________________________________________________  External Attachment:    Type:   Image     Comment:   External Document

## 2010-09-04 NOTE — Letter (Signed)
Summary: [facility] Office Note  [facility] Note   Imported By: Darcey Nora RN, CGRN 08/27/2010 14:18:40  _____________________________________________________________________  External Attachment:    Type:   Image     Comment:   External Document

## 2010-09-04 NOTE — Letter (Signed)
[name] Medical Office Visit  [facility] Visit   Imported By: Darcey Nora RN, CGRN 08/27/2010 14:19:12  _____________________________________________________________________  External Attachment:    Type:   Image     Comment:   External Document

## 2010-09-04 NOTE — Letter (Signed)
Summary: [facility] Note-Mesenteric Mass  [facility] Note-Mesenteric Mass   Imported By: Darcey Nora RN, CGRN 08/27/2010 14:17:09  _____________________________________________________________________  External Attachment:    Type:   Image     Comment:   External Document

## 2010-09-04 NOTE — Discharge Summary (Signed)
Summary: Small Bowel Obstruction  NAME:  [name], [name]            ACCOUNT NO.:  [id number]      MEDICAL RECORD NO.:  [id number]          PATIENT TYPE:  INP      LOCATION:  1538                         FACILITY:  [facility]      PHYSICIAN:  Valerie A. Felicity Coyer, MDDATE OF BIRTH:  09-26-1918      DATE OF ADMISSION:  07/21/2007   DATE OF DISCHARGE:  07/23/2007                                  DISCHARGE SUMMARY      DISCHARGE DIAGNOSIS:   1. Small bowel obstruction, resolved with conservative medical       management.   2. Mild hyperglycemia suspect stress reaction.  Hemoglobin A1c pending       at this time.   3. History of hypertension.   4. Dyslipidemia.   5. Abnormal chest x-ray with stable right lower lobe nonspecific       nodules, no change since previous exam December 2007.  No further       workup at this time.   6. Gastroesophageal reflux disease.  Continue proton pump inhibitor.   7. Anemia.  Discharge hemoglobin 10.9.  Iron studies pending at time       of dictation.   8. Mild hypokalemia secondary to gastrointestinal blood loss,       replacement ongoing.      CONSULTATIONS:  None.      DISCHARGE MEDICATIONS:  As prior to admission and include:   1. Aspirin 81 mg daily.   2. Crestor 10 mg daily.   3. Fish oil two tablets b.i.d.   4. Vitamin B complex daily.   5. Vitamin D 1 tablet daily.   6. Prilosec p.o. daily.   7. Clonidine p.r.n. systolic pressure greater than 170.      The patient is asked to hold off on her Lotrel 10/20 and HCTZ 12 mg   daily until follow-up with primary MD next week due to normotensive   pressure off medications at this time.  Discharge blood pressure 126/66.   Hospital follow-up will be arranged by patient with her primary MD, Dr.   Posey Rea in the next seven days.  The patient is also instructed to   call MD or return to the emergency room if recurrence of nausea,   vomiting, inability to tolerate p.o. or worsening pain prior to that   time.      CONDITION ON DISCHARGE:  Medically stable and anxious for discharge   home.      HOSPITAL COURSE BY PROBLEM:   1. Small bowel obstruction.  The patient is an 77 year old, overall       healthy and active woman, who presented to the emergency room with       less than 24 hours of abdominal pain associated with nausea and       vomiting.  One year ago she had a previous history of same       associated with small bowel obstruction presumably due to adhesions       from a history of partial colonic resection for ruptured appendix  and diverticulitis.  Plain films done in the emergency room showed       early evidence of small bowel obstruction, and thus she was       admitted to medicine for conservative management of her symptoms.       As one year ago, she responded quickly to conservative measures.       It was felt that surgery was not needed at this time.  She was left       n.p.o. for 24 hours, and follow-up film the next day showed       improvement in the bowel gas pattern.  She was thus started on a       clear liquid diet which she has tolerated well.  She has had two       bowel movements and no recurrence of the pain.  Films on the second       day follow-up showed resolution with a normal and nonspecific bowel       gas pattern.  The patient is anxious for solid food and for       discharge home.  As she does feel a touch of nausea this morning, I       have instructed the patient that I would prefer another 24 hours of       observation but should she tolerate two meals without nausea,       vomiting or recurrence of the pain, as she is otherwise stable with       normalization of her x-rays and clinical symptoms, will offer       discharge home for follow-up with primary MD next week as       instructed for reevaluation of her need of her usual home medicines       as described above.  No surgery consultation was pursued this       hospitalization.   2.  Other medical issues.  The patient's other medical issues are as       listed.  There was a small right lower lobe nodule noted on chest x-       ray was followed up with a CT of the chest that specifically found       an ill-defined pulmonary nodule which was unchanged from previous       demonstration on a CT done during hospitalization December 2007 as       there is been no change.  No further workup at this time is       planned.  Outpatient follow-up with primary MD as needed.  She was       also noted to be mildly anemic, presumably hydrational in nature as       there is no clinical bleeding or acute blood loss noted, no       hematemesis or melena.  Iron studies have been drawn but are       pending at this time.  Follow-up with primary MD next week for       reevaluation on this as needed.               Valerie A. Felicity Coyer, MD   Electronically Signed            VAL/MEDQ  D:  07/23/2007  T:  07/23/2007  Job:  [id number]

## 2010-09-04 NOTE — Assessment & Plan Note (Signed)
Summary: 6WK [name]/NWS   Vital Signs:  Patient profile:   77 year old female Menstrual status:  postmenopausal Height:      66 inches Weight:      136 pounds BMI:     22.03 Temp:     98.3 degrees F oral Pulse rate:   64 / minute Pulse rhythm:   regular Resp:     16 per minute BP sitting:   160 / 88  (left arm) Cuff size:   regular  Vitals Entered By: Lanier Prude, CMA(AAMA) (August 27, 2010 11:27 AM) CC: 6 wk f/u  Is Patient Diabetic? No Comments pt is not taking Dyazide   Primary Care Provider:  Tresa Garter MD  CC:  6 wk f/u .  History of Present Illness: The patient presents for a follow up of hypertension, A fib, hyperlipidemia  She has not been taking Diazide for ? reason...  Current Medications (verified): 1)  Crestor 10 Mg  Tabs (Rosuvastatin Calcium) .... Tale One  One Day Skip 2 Days and Then Repeat Over and Over 2)  Lovaza 1 Gm  Caps (Omega-3-Acid Ethyl Esters) .... Take 2 By Mouth Two Times A Day 3)  Lisinopril 40 Mg Tabs (Lisinopril) .Marland Kitchen.. 1 By Mouth Qd 4)  Alprazolam 0.5 Mg Tabs (Alprazolam) .Marland Kitchen.. 1 By Mouth Two Three Times A Day As Needed Anxiety 5)  Nitrostat 0.4 Mg Subl (Nitroglycerin) .... One Under The Tongue Q 5 Mins. As Needed Total of 3 Dosages in 15 Mins. 6)  Dyazide 37.5-25 Mg Caps (Triamterene-Hctz) .Marland Kitchen.. 1 By Mouth Once Daily 7)  Cartia Xt 120 Mg Xr24h-Cap (Diltiazem Hcl Coated Beads) .Marland Kitchen.. 1 By Mouth Bid 8)  Coumadin 5 Mg Tabs (Warfarin Sodium) .... As Directed 9)  Coumadin 5 Mg Tabs (Warfarin Sodium) .Marland Kitchen.. 1 Sun, Tue, Thurs and Sat.  2.5mg  Mon, We, Fri. 10)  Protonix 40 Mg Tbec (Pantoprazole Sodium) .Marland Kitchen.. 1 By Mouth Qam For Indigestion 11)  Toprol Xl 50 Mg Xr24h-Tab (Metoprolol Succinate) .Marland Kitchen.. 1 By Mouth Once Daily At [facility] 12)  Vitamin D3 1000 Unit  Tabs (Cholecalciferol) .... Two Times A Day 13)  Aspir-Low 81 Mg Tbec (Aspirin) .Marland Kitchen.. 1 Once Daily Pc  Allergies (verified): 1)  ! Phenergan 2)  ! Vytorin (Ezetimibe-Simvastatin) 3)  !  Promethazine Hcl (Promethazine Hcl) 4)  ! Microzide (Hydrochlorothiazide)  Past History:  Past Medical History: Last updated: 07/09/2010 Colonic polyps, hx of COPD Diverticulosis, colon GERD Dr Kinnie Scales Hyperlipidemia w/ poor tolerance of statins Hypertension Peripheral vascular disease  Dr Lynnea Ferrier S[facility] 2008, 2009 due to adhesions Mesenteric 2 cm mass Dr Marisa Severin Dr Thomasena Edis Anxiety Atrial fibrillation 2011  Social History: Last updated: 03/11/2009 Retired Single Former Smoker Regular exercise-yes - [employer]  Review of Systems  The patient denies vision loss, chest pain, syncope, dyspnea on exertion, abdominal pain, and melena.    Physical Exam  General:  alert, thin, cooperative to examination, and good hygiene.   Mouth:  Oral mucosa and oropharynx without lesions or exudates.  Teeth in good repair. Not dry Lungs:  normal respiratory effort, no intercostal retractions, no accessory muscle use, normal breath sounds, no dullness, no fremitus, no crackles, and no wheezes.   Heart:   no gallop, no rub, no JVD, tachycardia, and Grade  2/6 systolic ejection murmur over RUSB.  no gallop, no rub, no JVD, tachycardia, irregular rhythm. Abdomen:  soft, non-tender, normal bowel sounds, no distention, no masses, no guarding, no rigidity, no rebound tenderness, no abdominal hernia,  no inguinal hernia, no hepatomegaly, and no splenomegaly.   Msk:  normal ROM, no joint tenderness, no joint swelling, no joint warmth, no redness over joints, no joint deformities, no joint instability, no crepitation, and no muscle atrophy.   Extremities:  No clubbing, cyanosis, edema, or deformity noted with normal full range of motion of all joints.   Neurologic:  alert & oriented X3, cranial nerves II-XII intact, strength normal in all extremities, sensation intact to light touch, sensation intact to pinprick, gait normal, DTRs symmetrical and normal, finger-to-nose normal, heel-to-shin normal, toes down  bilaterally on Babinski, and Romberg negative.   Skin:  turgor normal, color normal, no rashes, no suspicious lesions, no ecchymoses, no petechiae, no purpura, no ulcerations, and no edema.   Psych:  Oriented X3, memory intact for recent and remote, normally interactive, not depressed appearing, and slightly anxious.     Impression & Recommendations:  Problem # 1:  HYPERTENSION (ICD-401.9) Assessment Deteriorated  Renal artery [place] is pending  Restart Dyazide. Risks of noncompliance with treatment discussed. Compliance encouraged.  Her updated medication list for this problem includes:    Lisinopril 40 Mg Tabs (Lisinopril) .Marland Kitchen... 1 by mouth qd    Dyazide 37.5-25 Mg Caps (Triamterene-hctz) .Marland Kitchen... 1 by mouth once daily    Cartia Xt 120 Mg Xr24h-cap (Diltiazem hcl coated beads) .Marland Kitchen... 1 by mouth bid    Toprol Xl 50 Mg Xr24h-tab (Metoprolol succinate) .Marland Kitchen... 1 by mouth once daily at hs  BP today: 160/88 Prior BP: 134/70 (07/16/2010)  Labs Reviewed: K+: 5.2 (08/25/2010) Creat: : 1.0 (08/25/2010)   Chol: 177 (08/25/2010)   HDL: 53.90 (08/25/2010)   LDL: 103 (08/25/2010)   TG: 99.0 (08/25/2010)  Problem # 2:  ATRIAL FIBRILLATION (ICD-427.31) Assessment: Improved  Her updated medication list for this problem includes:    Cartia Xt 120 Mg Xr24h-cap (Diltiazem hcl coated beads) .Marland Kitchen... 1 by mouth bid    Coumadin 5 Mg Tabs (Warfarin sodium) .Marland Kitchen... As directed    Coumadin 5 Mg Tabs (Warfarin sodium) .Marland Kitchen... 1 sun, tue, thurs and sat.  2.5mg  mon, we, fri.    Toprol Xl 50 Mg Xr24h-tab (Metoprolol succinate) .Marland Kitchen... 1 by mouth once daily at hs    Aspir-low 81 Mg Tbec (Aspirin) .Marland Kitchen... 1 once daily pc  Problem # 3:  ABDOMINAL PAIN, GENERALIZED (ICD-789.07) resolved Assessment: Improved  Problem # 4:  HYPERLIPIDEMIA (ICD-272.4) Assessment: Improved  Her updated medication list for this problem includes:    Crestor 10 Mg Tabs (Rosuvastatin calcium) .Marland Kitchen... Tale one  one day skip 2 days and then repeat over  and over    Lovaza 1 Gm Caps (Omega-3-acid ethyl esters) .Marland Kitchen... Take 2 by mouth two times a day  Problem # 5:  HYPERKALEMIA (ICD-276.7) Assessment: New Restart Diazide Repeat labs  Complete Medication List: 1)  Crestor 10 Mg Tabs (Rosuvastatin calcium) .... Tale one  one day skip 2 days and then repeat over and over 2)  Lovaza 1 Gm Caps (Omega-3-acid ethyl esters) .... Take 2 by mouth two times a day 3)  Lisinopril 40 Mg Tabs (Lisinopril) .Marland Kitchen.. 1 by mouth qd 4)  Alprazolam 0.5 Mg Tabs (Alprazolam) .Marland Kitchen.. 1 by mouth two three times a day as needed anxiety 5)  Nitrostat 0.4 Mg Subl (Nitroglycerin) .... One under the tongue q 5 mins. as needed total of 3 dosages in 15 mins. 6)  Dyazide 37.5-25 Mg Caps (Triamterene-hctz) .Marland Kitchen.. 1 by mouth once daily 7)  Cartia Xt 120 Mg Xr24h-cap (Diltiazem hcl coated  beads) .Marland Kitchen.. 1 by mouth bid 8)  Coumadin 5 Mg Tabs (Warfarin sodium) .... As directed 9)  Coumadin 5 Mg Tabs (Warfarin sodium) .Marland Kitchen.. 1 sun, tue, thurs and sat.  2.5mg  mon, we, fri. 10)  Protonix 40 Mg Tbec (Pantoprazole sodium) .Marland Kitchen.. 1 by mouth qam for indigestion 11)  Toprol Xl 50 Mg Xr24h-tab (Metoprolol succinate) .Marland Kitchen.. 1 by mouth once daily at hs 12)  Vitamin D3 1000 Unit Tabs (Cholecalciferol) .... Two times a day 13)  Aspir-low 81 Mg Tbec (Aspirin) .Marland Kitchen.. 1 once daily pc  Patient Instructions: 1)  Restart Dyazide 2)  Please schedule a follow-up appointment in 6 weeks with BM[facility] 401.1. Prescriptions: DYAZIDE 37.5-25 MG CAPS (TRIAMTERENE-HCTZ) 1 by mouth once daily  #30 x 11   Entered and Authorized by:   Tresa Garter MD   Signed by:   Tresa Garter MD on 08/27/2010   Method used:     RxID:   [id number]    Orders Added: 1)  Est. Patient Level IV [14782]

## 2010-09-04 NOTE — Procedures (Signed)
Summary: GI Endo Procedure  GI Endo Procedure   Imported By: Darcey Nora RN, CGRN 08/27/2010 14:03:40  _____________________________________________________________________  External Attachment:    Type:   Image     Comment:   External Document

## 2010-09-04 NOTE — Letter (Signed)
Summary: Colonoscopy-Dr [facility]  Colonoscopy-Dr [facility]   Imported By: Darcey Nora RN, CGRN 08/27/2010 14:04:49  _____________________________________________________________________  External Attachment:    Type:   Image     Comment:   External Document

## 2010-09-04 NOTE — Letter (Signed)
Summary: Hospital Discharge-Small Bowel Obstruction  Hospital Discharge-Small Bowel Obstruction   Imported By: Darcey Nora RN, CGRN 08/27/2010 14:01:40  _____________________________________________________________________  External Attachment:    Type:   Image     Comment:   External Document

## 2010-09-11 ENCOUNTER — Telehealth: Payer: Self-pay | Admitting: Internal Medicine

## 2010-09-12 ENCOUNTER — Ambulatory Visit (INDEPENDENT_AMBULATORY_CARE_PROVIDER_SITE_OTHER)
Admission: RE | Admit: 2010-09-12 | Discharge: 2010-09-12 | Disposition: A | Discharge status: Home / Self Care | Payer: Medicare Other | Source: Ambulatory Visit | Attending: Internal Medicine | Admitting: Internal Medicine

## 2010-09-12 ENCOUNTER — Ambulatory Visit (INDEPENDENT_AMBULATORY_CARE_PROVIDER_SITE_OTHER): Payer: Medicare Other | Admitting: Internal Medicine

## 2010-09-12 ENCOUNTER — Encounter: Payer: Self-pay | Admitting: Internal Medicine

## 2010-09-12 ENCOUNTER — Other Ambulatory Visit: Payer: Self-pay | Admitting: Internal Medicine

## 2010-09-12 DIAGNOSIS — I10 Essential (primary) hypertension: Secondary | ICD-10-CM

## 2010-09-12 DIAGNOSIS — J069 Acute upper respiratory infection, unspecified: Secondary | ICD-10-CM | POA: Insufficient documentation

## 2010-09-12 DIAGNOSIS — J3089 Other allergic rhinitis: Secondary | ICD-10-CM | POA: Insufficient documentation

## 2010-09-12 DIAGNOSIS — R05 Cough: Secondary | ICD-10-CM

## 2010-09-15 ENCOUNTER — Telehealth: Payer: Self-pay | Admitting: Internal Medicine

## 2010-09-16 NOTE — Assessment & Plan Note (Signed)
Summary: head congestion/plot/lb   Vital Signs:  Patient profile:   77 year old female Menstrual status:  postmenopausal Height:      66 inches (167.64 cm) Weight:      135 pounds (61.82 kg) BMI:     21.87 O2 Sat:      97 % on Room air Temp:     98.2 degrees F oral Pulse rate:   71 / minute Pulse rhythm:   regular Resp:     14 per minute BP sitting:   180 / 90  (left arm) Cuff size:   regular  Vitals Entered By: Burnard Leigh CMA(AAMA) (September 12, 2010 9:07 AM)  O2 Flow:  Room air CC: Pt has head cold x7 days w/congestion yellow in color that has progressed w/dizziness,  cough & elevated blood pressure/sls, cma, URI symptoms, Hypertension Management Is Patient Diabetic? No Comments Pt went to [facility] 09/11/2010   Primary Care Provider:  Tresa Garter MD  CC:  Pt has head cold x7 days w/congestion yellow in color that has progressed w/dizziness, cough & elevated blood pressure/sls, cma, URI symptoms, and Hypertension Management.  History of Present Illness:  URI Symptoms      This is a 77 year old woman who presents with URI symptoms.  The symptoms began 1 week ago.  The severity is described as mild.  The patient reports nasal congestion, clear nasal discharge, and dry cough, but denies purulent nasal discharge, sore throat, productive cough, and earache.  The patient denies fever, stiff neck, dyspnea, wheezing, rash, vomiting, diarrhea, use of an antipyretic, and response to antipyretic.  The patient also reports sneezing, seasonal symptoms, and response to antihistamine.  The patient denies headache, muscle aches, and severe fatigue.  The patient denies the following risk factors for Strep sinusitis: unilateral facial pain, unilateral nasal discharge, double sickening, tooth pain, Strep exposure, tender adenopathy, and absence of cough.    Hypertension History:      She complains of side effects from treatment, but denies headache, chest pain, palpitations,  dyspnea with exertion, orthopnea, PND, peripheral edema, visual symptoms, neurologic problems, and syncope.  She notes the following problems with antihypertensive medication side effects: cough.        Positive major cardiovascular risk factors include female age 75 years old or older, hyperlipidemia, and hypertension.  Negative major cardiovascular risk factors include no history of diabetes, negative family history for ischemic heart disease, and non-tobacco-user status.        Positive history for target organ damage include peripheral vascular disease.  Further assessment for target organ damage reveals no history of ASHD, cardiac end-organ damage (CHF/LVH), stroke/TIA, renal insufficiency, or hypertensive retinopathy.     Preventive Screening-Counseling & Management  Alcohol-Tobacco     Alcohol drinks/day: 0     Alcohol Counseling: not indicated; patient does not drink     Smoking Status: quit     Tobacco Counseling: to remain off tobacco products  Clinical Review Panels:  Prevention   Last Colonoscopy:  Location:  [facility].  (02/20/2002)  Immunizations   Last Flu Vaccine:  Fluvax 3+ (04/08/2010)   Last Pneumovax:  Pneumovax (08/13/2006)   Last Zoster Vaccine:  Zostavax (11/15/2006)  Lipid Management   Cholesterol:  177 (08/25/2010)   LDL (bad choesterol):  103 (08/25/2010)   HDL (good cholesterol):  53.90 (08/25/2010)   Triglycerides:  167 (05/10/2006)  Diabetes Management   HgBA1C:  7.1 (05/27/2010)   Creatinine:  1.0 (08/25/2010)  Last Flu Vaccine:  Fluvax 3+ (04/08/2010)   Last Pneumovax:  Pneumovax (08/13/2006)  CBC   WBC:  5.9 (08/25/2010)   RBC:  4.57 (08/25/2010)   Hgb:  13.8 (08/25/2010)   Hct:  41.2 (08/25/2010)   Platelets:  282.0 (08/25/2010)   MCV  90.2 (08/25/2010)   MCHC  33.6 (08/25/2010)   RDW  15.4 (08/25/2010)   PMN:  63.8 (08/25/2010)   Lymphs:  25.0 (08/25/2010)   Monos:  8.7 (08/25/2010)   Eosinophils:  2.1 (08/25/2010)    Basophil:  0.4 (08/25/2010)  Complete Metabolic Panel   Glucose:  112 (08/25/2010)   Sodium:  141 (08/25/2010)   Potassium:  5.2 (08/25/2010)   Chloride:  103 (08/25/2010)   CO2:  30 (08/25/2010)   BUN:  13 (08/25/2010)   Creatinine:  1.0 (08/25/2010)   Albumin:  4.0 (08/25/2010)   Total Protein:  7.0 (08/25/2010)   Calcium:  9.8 (08/25/2010)   Total Bili:  0.5 (08/25/2010)   Alk Phos:  69 (08/25/2010)   SGPT (ALT):  29 (08/25/2010)   SGOT (AST):  18 (08/25/2010)   Medications Prior to Update: 1)  Crestor 10 Mg  Tabs (Rosuvastatin Calcium) .... Tale One  One Day Skip 2 Days and Then Repeat Over and Over 2)  Lovaza 1 Gm  Caps (Omega-3-Acid Ethyl Esters) .... Take 2 By Mouth Two Times A Day 3)  Lisinopril 40 Mg Tabs (Lisinopril) .Marland Kitchen.. 1 By Mouth Qd 4)  Alprazolam 0.5 Mg Tabs (Alprazolam) .Marland Kitchen.. 1 By Mouth Two Three Times A Day As Needed Anxiety 5)  Nitrostat 0.4 Mg Subl (Nitroglycerin) .... One Under The Tongue Q 5 Mins. As Needed Total of 3 Dosages in 15 Mins. 6)  Dyazide 37.5-25 Mg Caps (Triamterene-Hctz) .Marland Kitchen.. 1 By Mouth Once Daily 7)  Cartia Xt 120 Mg Xr24h-Cap (Diltiazem Hcl Coated Beads) .Marland Kitchen.. 1 By Mouth Bid 8)  Coumadin 5 Mg Tabs (Warfarin Sodium) .... As Directed 9)  Coumadin 5 Mg Tabs (Warfarin Sodium) .Marland Kitchen.. 1 Sun, Tue, Thurs and Sat.  2.5mg  Mon, We, Fri. 10)  Protonix 40 Mg Tbec (Pantoprazole Sodium) .Marland Kitchen.. 1 By Mouth Qam For Indigestion 11)  Toprol Xl 50 Mg Xr24h-Tab (Metoprolol Succinate) .Marland Kitchen.. 1 By Mouth Once Daily At [facility] 12)  Vitamin D3 1000 Unit  Tabs (Cholecalciferol) .... Two Times A Day 13)  Aspir-Low 81 Mg Tbec (Aspirin) .Marland Kitchen.. 1 Once Daily Pc  Current Medications (verified): 1)  Crestor 10 Mg  Tabs (Rosuvastatin Calcium) .... Tale One  One Day Skip 2 Days and Then Repeat Over and Over 2)  Lovaza 1 Gm  Caps (Omega-3-Acid Ethyl Esters) .... Take 2 By Mouth Two Times A Day 3)  Alprazolam 0.5 Mg Tabs (Alprazolam) .Marland Kitchen.. 1 By Mouth Two Three Times A Day As Needed Anxiety 4)   Nitrostat 0.4 Mg Subl (Nitroglycerin) .... One Under The Tongue Q 5 Mins. As Needed Total of 3 Dosages in 15 Mins. 5)  Dyazide 37.5-25 Mg Caps (Triamterene-Hctz) .Marland Kitchen.. 1 By Mouth Once Daily 6)  Cartia Xt 120 Mg Xr24h-Cap (Diltiazem Hcl Coated Beads) .Marland Kitchen.. 1 By Mouth Bid 7)  Coumadin 5 Mg Tabs (Warfarin Sodium) .... As Directed 8)  Coumadin 5 Mg Tabs (Warfarin Sodium) .Marland Kitchen.. 1 Sun, Tue, Thurs and Sat.  2.5mg  Mon, We, Fri. 9)  Protonix 40 Mg Tbec (Pantoprazole Sodium) .Marland Kitchen.. 1 By Mouth Qam For Indigestion 10)  Toprol Xl 50 Mg Xr24h-Tab (Metoprolol Succinate) .Marland Kitchen.. 1 By Mouth Once Daily At [facility] 11)  Vitamin D3 1000 Unit  Tabs (Cholecalciferol) .... Two Times A Day 12)  Aspir-Low 81 Mg Tbec (Aspirin) .Marland Kitchen.. 1 Once Daily Pc 13)  Benicar 40 Mg Tabs (Olmesartan Medoxomil) .... One By Mouth Once Daily For High Blood Pressure  Allergies (verified): 1)  ! Phenergan 2)  ! Vytorin (Ezetimibe-Simvastatin) 3)  ! Promethazine Hcl (Promethazine Hcl) 4)  ! Microzide (Hydrochlorothiazide) 5)  ! * Lisinopril  Past History:  Past Medical History: Last updated: 07/09/2010 Colonic polyps, hx of COPD Diverticulosis, colon GERD Dr Kinnie Scales Hyperlipidemia w/ poor tolerance of statins Hypertension Peripheral vascular disease  Dr Lynnea Ferrier SBO 2008, 2009 due to adhesions Mesenteric 2 cm mass Dr Marisa Severin Dr Thomasena Edis Anxiety Atrial fibrillation 2011  Past Surgical History: Last updated: 08/27/2010 Cholecystectomy Segemental sigmoidectomy with colostomy and Hartmann Appendectomy Partial omentectomy and gastrostomy Colostomy w/Hartmann Closure of Rectosigmoid with end-to-end coloproctostomy anastomosis Lysis of extensive adhesions  Family History: Last updated: 08/27/2010 Family History of CAD Female 1st degree relative <50: Father Family History Hypertension  Social History: Last updated: 03/11/2009 Retired Single Former Smoker Regular exercise-yes - [employer]  Risk Factors: Alcohol Use: 0  (09/12/2010) Exercise: yes (05/15/2008)  Risk Factors: Smoking Status: quit (09/12/2010)  Family History: Reviewed history from 08/27/2010 and no changes required. Family History of CAD Female 1st degree relative <50: Father Family History Hypertension  Social History: Reviewed history from 03/11/2009 and no changes required. Retired Single Former Smoker Regular exercise-yes - [employer]  Review of Systems       The patient complains of prolonged cough.  The patient denies anorexia, fever, weight loss, weight gain, vision loss, decreased hearing, hoarseness, chest pain, syncope, dyspnea on exertion, peripheral edema, headaches, hemoptysis, abdominal pain, hematuria, muscle weakness, suspicious skin lesions, difficulty walking, depression, unusual weight change, abnormal bleeding, and angioedema.    Physical Exam  General:  alert, well-developed, well-nourished, well-hydrated, appropriate dress, normal appearance, healthy-appearing, cooperative to examination, and good hygiene.   Head:  normocephalic, atraumatic, no abnormalities observed, and no abnormalities palpated.   Eyes:  vision grossly intact, pupils equal, pupils round, pupils reactive to light, pupils react to accomodation, no injection, no nystagmus, and a-v nicking.   Ears:  R ear normal and L ear normal.   Nose:  no external deformity, clear nasal dischargel pallor, mucosal erythema, and mucosal edema.  no external deformity, no airflow obstruction, no intranasal foreign body, no nasal polyps, no nasal mucosal lesions, no mucosal friability, no active bleeding or clots, no sinus percussion tenderness, no septum abnormalities,  Mouth:  good dentition, pharynx pink and moist, no erythema, no exudates, no posterior lymphoid hypertrophy, no postnasal drip, no pharyngeal crowing, no lesions, no aphthous ulcers, no erosions, no tongue abnormalities, no leukoplakia, and no petechiae.   Neck:  supple, full ROM, no masses, no thyromegaly,  no thyroid nodules or tenderness, no JVD, normal carotid upstroke, no carotid bruits, and no cervical lymphadenopathy.   Lungs:  normal respiratory effort, no intercostal retractions, no accessory muscle use, normal breath sounds, no dullness, no fremitus, no crackles, and no wheezes.   Heart:   no gallop, no rub, no JVD, tachycardia, and Grade  2/6 systolic ejection murmur over RUSB.  regular rhythm.   Abdomen:  soft, non-tender, normal bowel sounds, no distention, no masses, no guarding, no rigidity, no rebound tenderness, no abdominal hernia, no inguinal hernia, no hepatomegaly, and no splenomegaly.   Msk:  normal ROM, no joint tenderness, no joint swelling, no joint warmth, no redness over joints, no joint deformities, no joint instability, no crepitation,  and no muscle atrophy.   Pulses:  R and L carotid,radial,femoral,dorsalis pedis and posterior tibial pulses are full and equal bilaterally Extremities:  No clubbing, cyanosis, edema, or deformity noted with normal full range of motion of all joints.   Neurologic:  alert & oriented X3, cranial nerves II-XII intact, strength normal in all extremities, sensation intact to light touch, sensation intact to pinprick, gait normal, and DTRs symmetrical and normal.   Skin:  turgor normal, color normal, no rashes, no suspicious lesions, no ecchymoses, no petechiae, no purpura, no ulcerations, and no edema.   Cervical Nodes:  No lymphadenopathy noted Axillary Nodes:  no R axillary adenopathy and no L axillary adenopathy.   Psych:  Oriented X3, memory intact for recent and remote, normally interactive, good eye contact, not anxious appearing, not depressed appearing, and not agitated.     Impression & Recommendations:  Problem # 1:  COUGH (ICD-786.2) Assessment New will check for pna, mass, edema, etc and stop the acei T-2 View CXR (71020TC)  Problem # 2:  ESSENTIAL HYPERTENSION, BENIGN (ICD-401.1) Assessment: Deteriorated  The following  medications were removed from the medication list:    Lisinopril 40 Mg Tabs (Lisinopril) .Marland Kitchen... 1 by mouth qd Her updated medication list for this problem includes:    Dyazide 37.5-25 Mg Caps (Triamterene-hctz) .Marland Kitchen... 1 by mouth once daily    Cartia Xt 120 Mg Xr24h-cap (Diltiazem hcl coated beads) .Marland Kitchen... 1 by mouth bid    Toprol Xl 50 Mg Xr24h-tab (Metoprolol succinate) .Marland Kitchen... 1 by mouth once daily at hs    Benicar 40 Mg Tabs (Olmesartan medoxomil) ..... One by mouth once daily for high blood pressure  BP today: 180/90 Prior BP: 160/88 (08/27/2010)  10 Yr Risk Heart Disease: 15 %  Labs Reviewed: K+: 5.2 (08/25/2010) Creat: : 1.0 (08/25/2010)   Chol: 177 (08/25/2010)   HDL: 53.90 (08/25/2010)   LDL: 103 (08/25/2010)   TG: 99.0 (08/25/2010)  Problem # 3:  URI (ICD-465.9) Assessment: New this sounds viral so no anitbiotics were given Her updated medication list for this problem includes:    Aspir-low 81 Mg Tbec (Aspirin) .Marland Kitchen... 1 once daily pc  Problem # 4:  ALLERGIC RHINITIS DUE TO OTHER ALLERGEN (ICD-477.8) Assessment: New  she will try benadryl but will avoid decongestants, gave depo-medrol IM today for symptoms relief  Orders: Admin of Therapeutic Inj  intramuscular or subcutaneous (04540) Depo- Medrol 40mg  (J1030)  Complete Medication List: 1)  Crestor 10 Mg Tabs (Rosuvastatin calcium) .... Tale one  one day skip 2 days and then repeat over and over 2)  Lovaza 1 Gm Caps (Omega-3-acid ethyl esters) .... Take 2 by mouth two times a day 3)  Alprazolam 0.5 Mg Tabs (Alprazolam) .Marland Kitchen.. 1 by mouth two three times a day as needed anxiety 4)  Nitrostat 0.4 Mg Subl (Nitroglycerin) .... One under the tongue q 5 mins. as needed total of 3 dosages in 15 mins. 5)  Dyazide 37.5-25 Mg Caps (Triamterene-hctz) .Marland Kitchen.. 1 by mouth once daily 6)  Cartia Xt 120 Mg Xr24h-cap (Diltiazem hcl coated beads) .Marland Kitchen.. 1 by mouth bid 7)  Coumadin 5 Mg Tabs (Warfarin sodium) .... As directed 8)  Coumadin 5 Mg Tabs  (Warfarin sodium) .Marland Kitchen.. 1 sun, tue, thurs and sat.  2.5mg  mon, we, fri. 9)  Protonix 40 Mg Tbec (Pantoprazole sodium) .Marland Kitchen.. 1 by mouth qam for indigestion 10)  Toprol Xl 50 Mg Xr24h-tab (Metoprolol succinate) .Marland Kitchen.. 1 by mouth once daily at hs 11)  Vitamin D3 1000  Unit Tabs (Cholecalciferol) .... Two times a day 12)  Aspir-low 81 Mg Tbec (Aspirin) .Marland Kitchen.. 1 once daily pc 13)  Benicar 40 Mg Tabs (Olmesartan medoxomil) .... One by mouth once daily for high blood pressure  Hypertension Assessment/Plan:      The patient's hypertensive risk group is category C: Target organ damage and/or diabetes.  Her calculated 10 year risk of coronary heart disease is 15 %.  Today's blood pressure is 180/90.  Her blood pressure goal is < 140/90.  Patient Instructions: 1)  Please schedule a follow-up appointment in 2 weeks. 2)  Check your Blood Pressure regularly. If it is above 140/90: you should make an appointment. 3)  Get plenty of rest, drink lots of clear liquids, and use Tylenol or Ibuprofen for fever and comfort. Return in 7-10 days if you're not better:sooner if you're feeling worse. Prescriptions: BENICAR 40 MG TABS (OLMESARTAN MEDOXOMIL) One by mouth once daily for high blood pressure  #70 x 0   Entered and Authorized by:   Etta Grandchild MD   Signed by:   Etta Grandchild MD on 09/12/2010   Method used:   Samples Given   RxID:   [id number]    Medication Administration  Injection # 1:    Medication: Depo- Medrol 40mg     Diagnosis: ALLERGIC RHINITIS DUE TO OTHER ALLERGEN (ICD-477.8)    Route: IM    Site: LUOQ gluteus    Exp Date: 01/2013    Lot #: [id number]    Mfr: [employer]    Comments: 120 mg administered    Patient tolerated injection without complications    Given by: Burnard Leigh CMA(AAMA) (September 12, 2010 1:41 PM)  Orders Added: 1)  T-2 View CXR [71020TC] 2)  Admin of Therapeutic Inj  intramuscular or subcutaneous [96372] 3)  Depo- Medrol 40mg  [J1030] 4)  Est. Patient Level V  [14782]

## 2010-09-16 NOTE — Progress Notes (Signed)
  Phone Note Call from Patient Call back at [facility] Phone [phone]   Summary of Call: Pt left vm regarding apt w/ MD - left vm for pt this am to call office back for more info.  Initial call taken by: Lamar Sprinkles, CMA,  September 11, 2010 2:16 PM  Follow-up for Phone Call        lmoam for pt to call back Follow-up by: Ami Bullins CMA,  September 12, 2010 9:15 AM  Additional Follow-up for Phone Call Additional follow up Details #1::        Seen today Additional Follow-up by: Lamar Sprinkles, CMA,  September 12, 2010 4:07 PM

## 2010-09-24 ENCOUNTER — Telehealth: Payer: Self-pay | Admitting: Internal Medicine

## 2010-09-25 NOTE — Progress Notes (Signed)
Summary: Call Report  Phone Note Other Incoming   Caller: [facility] Summary of Call: [facility] Triage Call Report Triage Record Num: [id number] Operator: Jeraldine Loots Patient Name: [name] Call Date & Time: 09/13/2010 8:09:10AM Patient Phone: [phone] PCP: Sonda Primes Patient Gender: Female PCP Fax : [phone] Patient DOB: December 28, 1918 Practice Name: [facility] Reason for Call: Daughter, [facility] calling. Her b/p is 191/90, pulse 64. Now 163/94. Was seen in the office Friday and was told to call if the b/p was 140/90. Alert and oriented. **Note to the office after triage. Protocol(s) Used: Hypertension, Diagnosed or Suspected Recommended Outcome per Protocol: Provide Home/Self Care Reason for Outcome: Single elevated blood pressure reading and has questions Care Advice:  ~ HEALTH PROMOTION / MAINTENANCE 02/ Initial call taken by: Margaret Pyle, CMA,  September 15, 2010 8:17 AM  Follow-up for Phone Call        noted  Thank you!  Follow-up by: Tresa Garter MD,  September 15, 2010 9:30 PM

## 2010-09-29 ENCOUNTER — Ambulatory Visit: Payer: Medicare Other | Admitting: Internal Medicine

## 2010-09-30 ENCOUNTER — Ambulatory Visit (INDEPENDENT_AMBULATORY_CARE_PROVIDER_SITE_OTHER): Payer: Medicare Other | Admitting: Internal Medicine

## 2010-09-30 ENCOUNTER — Encounter: Payer: Self-pay | Admitting: Internal Medicine

## 2010-09-30 DIAGNOSIS — R634 Abnormal weight loss: Secondary | ICD-10-CM

## 2010-09-30 DIAGNOSIS — E785 Hyperlipidemia, unspecified: Secondary | ICD-10-CM

## 2010-09-30 DIAGNOSIS — I10 Essential (primary) hypertension: Secondary | ICD-10-CM

## 2010-09-30 DIAGNOSIS — I2510 Atherosclerotic heart disease of native coronary artery without angina pectoris: Secondary | ICD-10-CM

## 2010-09-30 LAB — COMPREHENSIVE METABOLIC PANEL
ALT: 18 U/L (ref 0–35)
ALT: 24 U/L (ref 0–35)
AST: 16 U/L (ref 0–37)
Albumin: 3.2 g/dL — ABNORMAL LOW (ref 3.5–5.2)
Alkaline Phosphatase: 57 U/L (ref 39–117)
Calcium: 9.4 mg/dL (ref 8.4–10.5)
GFR calc Af Amer: >60 mL/min (ref >60)
Glucose, Bld: 128 mg/dL — ABNORMAL HIGH (ref 70–99)
Glucose, Bld: 137 mg/dL — ABNORMAL HIGH (ref 70–99)
Potassium: 4 mEq/L (ref 3.5–5.1)
Sodium: 139 mEq/L (ref 135–145)
Sodium: 139 mEq/L (ref 135–145)
Total Protein: 6.1 g/dL (ref 6.0–8.3)
Total Protein: 8.1 g/dL (ref 6.0–8.3)

## 2010-09-30 LAB — GLUCOSE, CAPILLARY
Glucose-Capillary: 105 mg/dL — ABNORMAL HIGH (ref 70–99)
Glucose-Capillary: 105 mg/dL — ABNORMAL HIGH (ref 70–99)
Glucose-Capillary: 113 mg/dL — ABNORMAL HIGH (ref 70–99)
Glucose-Capillary: 120 mg/dL — ABNORMAL HIGH (ref 70–99)
Glucose-Capillary: 121 mg/dL — ABNORMAL HIGH (ref 70–99)
Glucose-Capillary: 121 mg/dL — ABNORMAL HIGH (ref 70–99)
Glucose-Capillary: 124 mg/dL — ABNORMAL HIGH (ref 70–99)
Glucose-Capillary: 128 mg/dL — ABNORMAL HIGH (ref 70–99)
Glucose-Capillary: 129 mg/dL — ABNORMAL HIGH (ref 70–99)
Glucose-Capillary: 129 mg/dL — ABNORMAL HIGH (ref 70–99)
Glucose-Capillary: 129 mg/dL — ABNORMAL HIGH (ref 70–99)
Glucose-Capillary: 130 mg/dL — ABNORMAL HIGH (ref 70–99)
Glucose-Capillary: 134 mg/dL — ABNORMAL HIGH (ref 70–99)
Glucose-Capillary: 135 mg/dL — ABNORMAL HIGH (ref 70–99)
Glucose-Capillary: 135 mg/dL — ABNORMAL HIGH (ref 70–99)
Glucose-Capillary: 140 mg/dL — ABNORMAL HIGH (ref 70–99)
Glucose-Capillary: 141 mg/dL — ABNORMAL HIGH (ref 70–99)
Glucose-Capillary: 145 mg/dL — ABNORMAL HIGH (ref 70–99)
Glucose-Capillary: 157 mg/dL — ABNORMAL HIGH (ref 70–99)
Glucose-Capillary: 161 mg/dL — ABNORMAL HIGH (ref 70–99)
Glucose-Capillary: 195 mg/dL — ABNORMAL HIGH (ref 70–99)
Glucose-Capillary: 61 mg/dL — ABNORMAL LOW (ref 70–99)
Glucose-Capillary: 92 mg/dL (ref 70–99)

## 2010-09-30 LAB — CARDIAC PANEL(CRET KIN+CKTOT+MB+TROPI)
CK, MB: 1 ng/mL (ref 0.3–4.0)
Relative Index: INVALID (ref 0.0–2.5)
Total CK: 36 U/L (ref 7–177)
Total CK: 41 U/L (ref 7–177)

## 2010-09-30 LAB — BASIC METABOLIC PANEL
BUN: 7 mg/dL (ref 6–23)
BUN: 8 mg/dL (ref 6–23)
BUN: 8 mg/dL (ref 6–23)
CO2: 25 mEq/L (ref 19–32)
CO2: 27 mEq/L (ref 19–32)
Calcium: 8.6 mg/dL (ref 8.4–10.5)
Chloride: 108 mEq/L (ref 96–112)
Chloride: 109 mEq/L (ref 96–112)
Creatinine, Ser: 0.82 mg/dL (ref 0.4–1.2)
Creatinine, Ser: 0.88 mg/dL (ref 0.4–1.2)
GFR calc Af Amer: >60 mL/min (ref >60)
GFR calc non Af Amer: 55 mL/min — ABNORMAL LOW (ref >60)
Glucose, Bld: 128 mg/dL — ABNORMAL HIGH (ref 70–99)
Glucose, Bld: 141 mg/dL — ABNORMAL HIGH (ref 70–99)
Potassium: 3.6 mEq/L (ref 3.5–5.1)
Potassium: 3.8 mEq/L (ref 3.5–5.1)
Sodium: 143 mEq/L (ref 135–145)

## 2010-09-30 LAB — CBC
HCT: 32.8 % — ABNORMAL LOW (ref 36.0–46.0)
HCT: 36.8 % (ref 36.0–46.0)
HCT: 45.9 % (ref 36.0–46.0)
Hemoglobin: 16 g/dL — ABNORMAL HIGH (ref 12.0–15.0)
MCH: 31.5 pg (ref 26.0–34.0)
MCH: 31.8 pg (ref 26.0–34.0)
MCHC: 34.8 g/dL (ref 30.0–36.0)
MCV: 91.4 fL (ref 78.0–100.0)
Platelets: 265 10*3/uL (ref 150–400)
Platelets: 314 10*3/uL (ref 150–400)
RDW: 13.1 % (ref 11.5–15.5)
RDW: 13.2 % (ref 11.5–15.5)
WBC: 10.5 10*3/uL (ref 4.0–10.5)
WBC: 6.6 10*3/uL (ref 4.0–10.5)

## 2010-09-30 LAB — PROTIME-INR
INR: 2.14 — ABNORMAL HIGH (ref 0.00–1.49)
INR: 2.16 — ABNORMAL HIGH (ref 0.00–1.49)
Prothrombin Time: 24.2 seconds — ABNORMAL HIGH (ref 11.6–15.2)

## 2010-09-30 LAB — LACTIC ACID, PLASMA: Lactic Acid, Venous: 1.4 mmol/L (ref 0.5–2.2)

## 2010-09-30 LAB — MAGNESIUM: Magnesium: 1.9 mg/dL (ref 1.5–2.5)

## 2010-09-30 LAB — LIPASE, BLOOD: Lipase: 24 U/L (ref 11–59)

## 2010-09-30 LAB — DIFFERENTIAL
Lymphocytes Relative: 8 % — ABNORMAL LOW (ref 12–46)
Lymphs Abs: 1.2 10*3/uL (ref 0.7–4.0)
Neutro Abs: 13 10*3/uL — ABNORMAL HIGH (ref 1.7–7.7)
Neutrophils Relative %: 87 % — ABNORMAL HIGH (ref 43–77)

## 2010-09-30 LAB — PHOSPHORUS: Phosphorus: 3.5 mg/dL (ref 2.3–4.6)

## 2010-09-30 NOTE — Progress Notes (Signed)
Summary: [facility]  Phone Note Call from Patient   Summary of Call: Patient is requesting a call regarding her BP.  Initial call taken by: Lamar Sprinkles, CMA,  September 24, 2010 10:33 AM  Follow-up for Phone Call        Spoke to pt. she states her BP has been flucuating. She had some dizziness and nausea that has resolved now. She states she took 1/2 of alprazolam and her BP dropped. She denies, CP, arm/neck/jaw pain.  I advised her to keep her appt with Dr. Posey Rea on Tues, check her blood pressure regularly but not 3 X day and call us or go to ER if symptoms return or worsen. Follow-up by: Lanier Prude, [facility]),  September 24, 2010 1:33 PM  Additional Follow-up for Phone Call Additional follow up Details #1::        Agree Thank you!  Additional Follow-up by: Tresa Garter MD,  September 25, 2010 7:12 AM

## 2010-10-04 DIAGNOSIS — I2510 Atherosclerotic heart disease of native coronary artery without angina pectoris: Secondary | ICD-10-CM | POA: Insufficient documentation

## 2010-10-07 ENCOUNTER — Other Ambulatory Visit: Payer: Medicare Other

## 2010-10-07 NOTE — Assessment & Plan Note (Signed)
Summary: FU FROM DR [name] / [name]   Vital Signs:  Patient profile:   77 year old female Menstrual status:  postmenopausal Height:      66 inches Weight:      131 pounds BMI:     21.22 Temp:     98.0 degrees F oral Pulse rate:   76 / minute Pulse rhythm:   regular Resp:     16 per minute BP sitting:   108 / 72  (left arm) Cuff size:   regular  Vitals Entered By: Lanier Prude, Beverly Gust) (September 30, 2010 11:28 AM) CC: f/u Is Patient Diabetic? No   Primary Care Provider:  Tresa Garter MD  CC:  f/u.  History of Present Illness: C/o wt loss and lack of appetite F/u HTN, CAD  Current Medications (verified): 1)  Crestor 10 Mg  Tabs (Rosuvastatin Calcium) .... Tale One  One Day Skip 2 Days and Then Repeat Over and Over 2)  Lovaza 1 Gm  Caps (Omega-3-Acid Ethyl Esters) .... Take 2 By Mouth Two Times A Day 3)  Alprazolam 0.5 Mg Tabs (Alprazolam) .Marland Kitchen.. 1 By Mouth Two Three Times A Day As Needed Anxiety 4)  Nitrostat 0.4 Mg Subl (Nitroglycerin) .... One Under The Tongue Q 5 Mins. As Needed Total of 3 Dosages in 15 Mins. 5)  Dyazide 37.5-25 Mg Caps (Triamterene-Hctz) .Marland Kitchen.. 1 By Mouth Once Daily 6)  Cartia Xt 120 Mg Xr24h-Cap (Diltiazem Hcl Coated Beads) .Marland Kitchen.. 1 By Mouth Bid 7)  Coumadin 5 Mg Tabs (Warfarin Sodium) .... As Directed 8)  Coumadin 5 Mg Tabs (Warfarin Sodium) .Marland Kitchen.. 1 Sun, Tue, Thurs and Sat.  2.5mg  Mon, We, Fri. 9)  Protonix 40 Mg Tbec (Pantoprazole Sodium) .Marland Kitchen.. 1 By Mouth Qam For Indigestion 10)  Toprol Xl 50 Mg Xr24h-Tab (Metoprolol Succinate) .Marland Kitchen.. 1 By Mouth Once Daily At [facility] 11)  Vitamin D3 1000 Unit  Tabs (Cholecalciferol) .... Two Times A Day 12)  Aspir-Low 81 Mg Tbec (Aspirin) .Marland Kitchen.. 1 Once Daily Pc 13)  Benicar 40 Mg Tabs (Olmesartan Medoxomil) .... One By Mouth Once Daily For High Blood Pressure  Allergies (verified): 1)  ! Phenergan 2)  ! Vytorin (Ezetimibe-Simvastatin) 3)  ! Promethazine Hcl (Promethazine Hcl) 4)  ! Microzide  (Hydrochlorothiazide) 5)  ! * Lisinopril  Past History:  Social History: Last updated: 03/11/2009 Retired Single Former Smoker Regular exercise-yes - [employer]  Past Medical History: Colonic polyps, hx of COPD Diverticulosis, colon GERD Dr Kinnie Scales Hyperlipidemia w/ poor tolerance of statins Hypertension Peripheral vascular disease  Dr Lynnea Ferrier SBO 2008, 2009 due to adhesions Mesenteric 2 cm mass Dr Marisa Severin Dr Thomasena Edis Anxiety Atrial fibrillation 2011 Coronary artery disease  Review of Systems       The patient complains of anorexia and weight loss.  The patient denies fever, chest pain, syncope, dyspnea on exertion, abdominal pain, melena, hematochezia, and severe indigestion/heartburn.    Physical Exam  General:  alert, well-developed, well-nourished, well-hydrated, appropriate dress, normal appearance, healthy-appearing, cooperative to examination, and good hygiene.   Ears:  R ear normal and L ear normal.   Nose:  no external deformity, clear nasal dischargel pallor, mucosal erythema, and mucosal edema.  no external deformity, no airflow obstruction, no intranasal foreign body, no nasal polyps, no nasal mucosal lesions, no mucosal friability, no active bleeding or clots, no sinus percussion tenderness, no septum abnormalities,  Mouth:  good dentition, pharynx pink and moist, no erythema, no exudates, no posterior lymphoid hypertrophy, no postnasal drip, no  pharyngeal crowing, no lesions, no aphthous ulcers, no erosions, no tongue abnormalities, no leukoplakia, and no petechiae.   Neck:  supple, full ROM, no masses, no thyromegaly, no thyroid nodules or tenderness, no JVD, normal carotid upstroke, no carotid bruits, and no cervical lymphadenopathy.   Lungs:  normal respiratory effort, no intercostal retractions, no accessory muscle use, normal breath sounds, no dullness, no fremitus, no crackles, and no wheezes.   Heart:   no gallop, no rub, no JVD, tachycardia, and Grade  2/6  systolic ejection murmur over RUSB.  regular rhythm.   Abdomen:  soft, non-tender, normal bowel sounds, no distention, no masses, no guarding, no rigidity, no rebound tenderness, no abdominal hernia, no inguinal hernia, no hepatomegaly, and no splenomegaly.   Msk:  normal ROM, no joint tenderness, no joint swelling, no joint warmth, no redness over joints, no joint deformities, no joint instability, no crepitation, and no muscle atrophy.   Neurologic:  alert & oriented X3, cranial nerves II-XII intact, strength normal in all extremities, sensation intact to light touch, sensation intact to pinprick, gait normal, and DTRs symmetrical and normal.   Skin:  turgor normal, color normal, no rashes, no suspicious lesions, no ecchymoses, no petechiae, no purpura, no ulcerations, and no edema.   Psych:  Oriented X3, memory intact for recent and remote, normally interactive, good eye contact, not anxious appearing, not depressed appearing, and not agitated.     Impression & Recommendations:  Problem # 1:  WEIGHT LOSS (ICD-783.21) Assessment Deteriorated Hold Crestor x 1-2 wks  Problem # 2:  HYPERTENSION (ICD-401.9) Assessment: Improved Renal artery [place] was nl Her updated medication list for this problem includes:    Dyazide 37.5-25 Mg Caps (Triamterene-hctz) .Marland Kitchen... 1 by mouth once daily    Cartia Xt 120 Mg Xr24h-cap (Diltiazem hcl coated beads) .Marland Kitchen... 1 by mouth bid    Toprol Xl 50 Mg Xr24h-tab (Metoprolol succinate) .Marland Kitchen... 1 by mouth once daily at hs    Benicar 40 Mg Tabs (Olmesartan medoxomil) ..... One by mouth once daily for high blood pressure  Problem # 3:  ANXIETY (ICD-300.00) Assessment: Improved  Her updated medication list for this problem includes:    Alprazolam 0.5 Mg Tabs (Alprazolam) .Marland Kitchen... 1 by mouth two three times a day as needed anxiety  Problem # 4:  CORONARY ARTERY DISEASE (ICD-414.00) Assessment: Unchanged  Her updated medication list for this problem includes:    Nitrostat 0.4  Mg Subl (Nitroglycerin) ..... One under the tongue q 5 mins. as needed total of 3 dosages in 15 mins.    Dyazide 37.5-25 Mg Caps (Triamterene-hctz) .Marland Kitchen... 1 by mouth once daily    Cartia Xt 120 Mg Xr24h-cap (Diltiazem hcl coated beads) .Marland Kitchen... 1 by mouth bid    Toprol Xl 50 Mg Xr24h-tab (Metoprolol succinate) .Marland Kitchen... 1 by mouth once daily at hs    Aspir-low 81 Mg Tbec (Aspirin) .Marland Kitchen... 1 once daily pc    Benicar 40 Mg Tabs (Olmesartan medoxomil) ..... One by mouth once daily for high blood pressure  Complete Medication List: 1)  Crestor 10 Mg Tabs (Rosuvastatin calcium) .... Tale one  one day skip 2 days and then repeat over and over 2)  Lovaza 1 Gm Caps (Omega-3-acid ethyl esters) .... Take 2 by mouth two times a day 3)  Alprazolam 0.5 Mg Tabs (Alprazolam) .Marland Kitchen.. 1 by mouth two three times a day as needed anxiety 4)  Nitrostat 0.4 Mg Subl (Nitroglycerin) .... One under the tongue q 5 mins. as needed total  of 3 dosages in 15 mins. 5)  Dyazide 37.5-25 Mg Caps (Triamterene-hctz) .Marland Kitchen.. 1 by mouth once daily 6)  Cartia Xt 120 Mg Xr24h-cap (Diltiazem hcl coated beads) .Marland Kitchen.. 1 by mouth bid 7)  Coumadin 5 Mg Tabs (Warfarin sodium) .... As directed 8)  Coumadin 5 Mg Tabs (Warfarin sodium) .Marland Kitchen.. 1 sun, tue, thurs and sat.  2.5mg  mon, we, fri. 9)  Protonix 40 Mg Tbec (Pantoprazole sodium) .Marland Kitchen.. 1 by mouth qam for indigestion 10)  Toprol Xl 50 Mg Xr24h-tab (Metoprolol succinate) .Marland Kitchen.. 1 by mouth once daily at hs 11)  Vitamin D3 1000 Unit Tabs (Cholecalciferol) .... Two times a day 12)  Aspir-low 81 Mg Tbec (Aspirin) .Marland Kitchen.. 1 once daily pc 13)  Benicar 40 Mg Tabs (Olmesartan medoxomil) .... One by mouth once daily for high blood pressure  Patient Instructions: 1)  Keep return office visit  2)  Do not take Crestor x 1-2 wks to see if apetite is better   Orders Added: 1)  Est. Patient Level IV [46962]

## 2010-10-10 ENCOUNTER — Ambulatory Visit: Payer: Medicare Other | Admitting: Internal Medicine

## 2010-10-15 ENCOUNTER — Encounter: Payer: Self-pay | Admitting: Internal Medicine

## 2010-10-15 ENCOUNTER — Ambulatory Visit (INDEPENDENT_AMBULATORY_CARE_PROVIDER_SITE_OTHER): Payer: Medicare Other | Admitting: Internal Medicine

## 2010-10-15 VITALS — BP 134/80 | HR 64 | Ht 64.0 in | Wt 130.0 lb

## 2010-10-15 DIAGNOSIS — K56609 Unspecified intestinal obstruction, unspecified as to partial versus complete obstruction: Secondary | ICD-10-CM

## 2010-10-15 NOTE — Patient Instructions (Signed)
Please follow up as needed 

## 2010-10-15 NOTE — Progress Notes (Signed)
[name] 05-15-1919 MRN [id number]     History of Present Illness:  This is a 77 year old white female with history of small bowel obstructions. Her last episode in November 2011 required hospitalization. The obstruction has been attributed to pelvic adhesions from a previous colon resection in 1997 for perforated diverticulitis. She underwent a sigmoid resection and diverting colostomy with subsequent takedown of the colostomy in 1997. Her most recent small bowel obstruction resolved spontaneously on NG suction. She is currently asymptomatic. She denies nausea, vomiting or abdominal pain. Her bowel habits are normal. She had a prior colonoscopy in 1995 with findings of adenomatous polyps. Subsequent colonoscopies in 1999 and 2003 was normal. A CT scan of the abdomen in 2004 showed tethered appearance of the small bowel in the left lower quadrant. She has a history of an esophageal stricture dilated in 2001. She denies any dysphagia or symptoms of reflux. She takes Protonix 40 mg daily. There was a large hiatal hernia on EGD 07/1999.   Past Medical History  Diagnosis Date  . Hx of colonic polyp   . COPD (chronic obstructive pulmonary disease)   . Diverticulosis of colon   . GERD (gastroesophageal reflux disease)     Dr. Kinnie Scales  . Hyperlipemia   . Hypertension   . PVD (peripheral vascular disease)     Dr. Lynnea Ferrier  . SBO (small bowel obstruction)      due to adhesions  . Mesenteric mass     2 cm  . Anxiety   . Atrial fibrillation 2011  . Unspecified peritonitis   . Stricture and stenosis of esophagus   . Aortic stenosis   . Heart murmur, systolic   . Unspecified vitamin D deficiency   . Peripheral vascular disease, unspecified   . Hx of adenomatous colonic polyps   . Small bowel obstruction     Hx of    Past Surgical History  Procedure Date  . Cholecystectomy   . Appendectomy   . Sigmoidectomy     Segemental with colostomy and Hartmann  . Omentectomy     Partial with  gastrostomy  . Colostomy closure     with end-to-end coloproctostomy anstomosis  . Abdominal adhesion surgery     reports that she has quit smoking. She has never used smokeless tobacco. She reports that she does not drink alcohol or use illicit drugs. family history includes Coronary artery disease in her father and Hypertension in her other.  There is no history of Colon cancer. Allergies  Allergen Reactions  . Ezetimibe-Simvastatin     REACTION: ACHES  . Hydrochlorothiazide     REACTION: ? rash  . Lisinopril     REACTION: cough  . Promethazine Hcl     REACTION: palpitations        Review of Systems: Negative for abdominal pain, dysphagia, nausea or vomiting, negative for constipation, weight has been stable after the initial weight loss of 10 pounds  The remainder of the 10  point ROS is negative except as outlined in H&P   Physical Exam: General appearance  Well developed in no distress. Eyes- non icteric HEENT nontraumatic, normocephalic. Mouth no lesions, tongue papillated, no cheilosis Neck supple without adenopathy, thyroid not enlarged, no carotid bruits, no JVD. Lungs Clear to auscultation bilaterally. Cor normal S1, normal S2, regular rhythm , no murmur,  quiet precordium. Abdomen soft nontender abdomen with well-healed surgical scar. Normoactive bowel sounds. No distention. Liver edge at costal margin. No fullness. Rectal: External hemorrhoidal tags. Normal rectal  sphincter tone. Hemoccult-negative stool. Extremities no pedal edema. Skin no lesions. Neurological alert and oriented x 3. Psychological normal mood and affect.  Assessment and Plan: Problems #1- small bowel obstruction. Patient is currently asymptomatic. I suspect pelvic adhesions from prior colectomy and takedown of colostomy. I have advised patient to restrict her fiber intake and eat small feedings at frequent intervals instead of large meals. No imaging studies are indicated at this  time.  Problem #2 colon polyps- Patient had an adenomatous polyp in 1995. There were no polyps in 1999 and 2003. There is no recall colonoscopy planned due to age and the fact that she is asymptomatic and Hemoccult-negative. She is not interested in screening colonoscopy at this point.  Problem #3- gastroesophageal reflux. This was initially evaluated in 2001 and status post esophageal dilatation. She has a large hiatal hernia but is asymptomatic on Protonix 40 mg daily.   10/15/2010 [name]

## 2010-11-18 ENCOUNTER — Other Ambulatory Visit: Payer: Self-pay | Admitting: Internal Medicine

## 2010-11-25 ENCOUNTER — Telehealth: Payer: Self-pay

## 2010-11-25 NOTE — Telephone Encounter (Signed)
Spoke w/daughter. Pt has had 2 episodes of stating she felt "bad" and "weak, unstable". BP and HR was "normal". Episode was very short and xanax helped relieve symptoms. I scheduled pt for OV tomorrow at 4 and advised daughter to call if any further changes.

## 2010-11-25 NOTE — Telephone Encounter (Signed)
Patient daughter called stating that mom has been having  Episodes of jerking and faintness x 11-22-10. She already has a follow up scheduled for next Wednesday but needs something sooner."Per daughter , pt seems a little out of it."

## 2010-11-25 NOTE — Telephone Encounter (Signed)
Agree. Thx 

## 2010-11-26 ENCOUNTER — Encounter: Payer: Self-pay | Admitting: Internal Medicine

## 2010-11-26 ENCOUNTER — Ambulatory Visit (INDEPENDENT_AMBULATORY_CARE_PROVIDER_SITE_OTHER): Payer: Medicare Other | Admitting: Internal Medicine

## 2010-11-26 DIAGNOSIS — R209 Unspecified disturbances of skin sensation: Secondary | ICD-10-CM

## 2010-11-26 DIAGNOSIS — R410 Disorientation, unspecified: Secondary | ICD-10-CM | POA: Insufficient documentation

## 2010-11-26 DIAGNOSIS — R634 Abnormal weight loss: Secondary | ICD-10-CM

## 2010-11-26 DIAGNOSIS — R110 Nausea: Secondary | ICD-10-CM

## 2010-11-26 DIAGNOSIS — F29 Unspecified psychosis not due to a substance or known physiological condition: Secondary | ICD-10-CM

## 2010-11-26 DIAGNOSIS — E785 Hyperlipidemia, unspecified: Secondary | ICD-10-CM

## 2010-11-26 DIAGNOSIS — R202 Paresthesia of skin: Secondary | ICD-10-CM

## 2010-11-26 DIAGNOSIS — I2510 Atherosclerotic heart disease of native coronary artery without angina pectoris: Secondary | ICD-10-CM

## 2010-11-26 MED ORDER — MIRTAZAPINE 15 MG PO TABS: 15.0000 mg | ORAL_TABLET | Freq: Every day | ORAL | Status: DC

## 2010-11-26 MED ORDER — ALPRAZOLAM 0.25 MG PO TABS: 0.2500 mg | ORAL_TABLET | Freq: Two times a day (BID) | ORAL | Status: DC

## 2010-11-26 NOTE — Progress Notes (Signed)
  Subjective:    Patient ID: [name], female    DOB: [birth year], 77 y.o.   MRN: [id number]  HPI  C/o confusion and jerking Spoke w/daughter. Pt has had 2 episodes of stating she felt "bad" and "weak, unstable". BP and HR was "normal". Episode was very short and xanax helped relieve symptoms.  Patient daughter called prior stating that mom has been having episodes of jerking and faintness on 11-22-10 - a few min. No LOC. She already has a follow up scheduled for next Wednesday but needs something sooner."Per daughter , pt seems a little out of it."  C/o nausea with Coumadin or some other med...  Review of Systems  Constitutional: Negative for diaphoresis and fatigue.  HENT: Negative for congestion.   Eyes: Negative for pain.  Respiratory: Negative for cough.   Cardiovascular: Negative for leg swelling.  Musculoskeletal: Negative for back pain.  Neurological: Negative for seizures.  Psychiatric/Behavioral: Positive for decreased concentration. Negative for suicidal ideas, hallucinations, behavioral problems, confusion, sleep disturbance, dysphoric mood and agitation. The patient is nervous/anxious. The patient is not hyperactive.   Anxious, sad at times     Objective:   Physical Exam  Constitutional: She appears well-developed and well-nourished. No distress.  HENT:  Head: Normocephalic.  Right Ear: External ear normal.  Left Ear: External ear normal.  Nose: Nose normal.  Mouth/Throat: Oropharynx is clear and moist.  Eyes: Conjunctivae are normal. Pupils are equal, round, and reactive to light. Right eye exhibits no discharge. Left eye exhibits no discharge.  Neck: Normal range of motion. Neck supple. No JVD present. No tracheal deviation present. No thyromegaly present.  Cardiovascular: Normal rate, regular rhythm and normal heart sounds.   Pulmonary/Chest: No stridor. No respiratory distress. She has no wheezes.  Abdominal: Soft. Bowel sounds are normal. She exhibits no  distension and no mass. There is no tenderness. There is no rebound and no guarding.  Musculoskeletal: She exhibits no edema and no tenderness.  Lymphadenopathy:    She has no cervical adenopathy.  Neurological: She displays normal reflexes. No cranial nerve deficit. She exhibits normal muscle tone. Coordination normal.  Skin: No rash noted. No erythema.  Psychiatric: She has a normal mood and affect. Her behavior is normal. Judgment and thought content normal.          Assessment & Plan:  WEIGHT LOSS Start Remeron  HYPERLIPIDEMIA Off Crestor now due to pains    Anxiety  Depression  Start Remeron  Confusion  Reduce Xanax

## 2010-11-26 NOTE — Assessment & Plan Note (Signed)
Off Crestor now due to pains

## 2010-11-26 NOTE — Assessment & Plan Note (Signed)
Start Remeron 

## 2010-11-27 ENCOUNTER — Other Ambulatory Visit: Payer: Self-pay | Admitting: Internal Medicine

## 2010-11-27 ENCOUNTER — Other Ambulatory Visit (INDEPENDENT_AMBULATORY_CARE_PROVIDER_SITE_OTHER): Payer: Medicare Other

## 2010-11-27 ENCOUNTER — Other Ambulatory Visit (INDEPENDENT_AMBULATORY_CARE_PROVIDER_SITE_OTHER): Payer: Medicare Other | Admitting: Internal Medicine

## 2010-11-27 DIAGNOSIS — Z13220 Encounter for screening for lipoid disorders: Secondary | ICD-10-CM

## 2010-11-27 DIAGNOSIS — R202 Paresthesia of skin: Secondary | ICD-10-CM

## 2010-11-27 DIAGNOSIS — R110 Nausea: Secondary | ICD-10-CM

## 2010-11-27 DIAGNOSIS — R209 Unspecified disturbances of skin sensation: Secondary | ICD-10-CM

## 2010-11-27 DIAGNOSIS — I2510 Atherosclerotic heart disease of native coronary artery without angina pectoris: Secondary | ICD-10-CM

## 2010-11-27 LAB — COMPREHENSIVE METABOLIC PANEL
AST: 28 U/L (ref 0–37)
Albumin: 3.6 g/dL (ref 3.5–5.2)
Alkaline Phosphatase: 62 U/L (ref 39–117)
BUN: 20 mg/dL (ref 6–23)
Creatinine, Ser: 1.2 mg/dL (ref 0.4–1.2)
Glucose, Bld: 80 mg/dL (ref 70–99)
Potassium: 5.9 mEq/L — ABNORMAL HIGH (ref 3.5–5.1)
Total Bilirubin: 0.5 mg/dL (ref 0.3–1.2)

## 2010-11-27 LAB — CBC WITH DIFFERENTIAL/PLATELET
Basophils Relative: 0.7 % (ref 0.0–3.0)
Eosinophils Absolute: 0.1 10*3/uL (ref 0.0–0.7)
Eosinophils Relative: 1 % (ref 0.0–5.0)
HCT: 36 % (ref 36.0–46.0)
Hemoglobin: 12.4 g/dL (ref 12.0–15.0)
MCHC: 34.6 g/dL (ref 30.0–36.0)
MCV: 92.7 fl (ref 78.0–100.0)
Monocytes Absolute: 0.5 10*3/uL (ref 0.1–1.0)
Neutro Abs: 3 10*3/uL (ref 1.4–7.7)
Neutrophils Relative %: 56.4 % (ref 43.0–77.0)
RBC: 3.88 Mil/uL (ref 3.87–5.11)
WBC: 5.2 10*3/uL (ref 4.5–10.5)

## 2010-11-27 LAB — VITAMIN B12: Vitamin B-12: 433 pg/mL (ref 211–911)

## 2010-11-27 LAB — LIPID PANEL
HDL: 46.9 mg/dL (ref >39.00)
Triglycerides: 204 mg/dL — ABNORMAL HIGH (ref 0.0–149.0)

## 2010-11-27 LAB — TSH: TSH: 0.74 u[IU]/mL (ref 0.35–5.50)

## 2010-11-27 NOTE — Telephone Encounter (Signed)
Pls inform - high potassium -1) take Kayexelate 15 g bid. Check BMET on Monday 995.20. 2) Hold Benicar Thx

## 2010-11-28 MED ORDER — SODIUM POLYSTYRENE SULFONATE PO POWD: Freq: Two times a day (BID) | ORAL | Status: DC

## 2010-12-01 ENCOUNTER — Encounter: Payer: Self-pay | Admitting: Internal Medicine

## 2010-12-02 NOTE — H&P (Signed)
NAMECaidynce, [name]            ACCOUNT NO.:  [id number]   MEDICAL RECORD NO.:  [id number]          PATIENT TYPE:  INP   LOCATION:  0102                         FACILITY:  [facility]   PHYSICIAN:  Hollice Espy, M.D.DATE OF BIRTH:  1919/03/30   DATE OF ADMISSION:  07/21/2007  DATE OF DISCHARGE:                              HISTORY & PHYSICAL   PRIORITY ADMISSION HISTORY AND PHYSICAL   PRIMARY CARE PHYSICIAN:  Georgina Quint. Plotnikov, MD, of [place].   CHIEF COMPLAINT:  Abdominal pain, nausea, vomiting.   HISTORY OF PRESENT ILLNESS:  The patient is an 77 year old, white  female, past medical history of hypertension, hyperlipidemia, and  previous history of abdominal surgery, specifically a partial colonic  resection for a ruptured appendix and diverticulum, who also had a small  bowel obstruction one year ago.  She was doing fine up until  approximately 24 hours ago when she started having the sudden onset  severe nausea and vomiting and abdominal pain without anything that she  ate.  The symptoms did not improve and she came to the emergency room  for further evaluation.  She underwent an acute abdominal series, which  showed fluid-filled loops of bowel concerning for an obstructive process  in the abdomen.  She was also noted to have an incidental right lower  lobe nodule.  On her lab work, she was noted to have a white count of  16.9 with an 89% shift.  The rest of her labs were essentially  unremarkable.  The patient was given medication for pain and nausea and  is feeling a little better.  Currently, she denies any headaches, vision  changes, dysphagia, chest pain, palpitations, shortness of breath,  wheeze, cough, abdominal pain, hematuria, dysuria, constipation,  diarrhea, focal extremity numbness, weakness, or pain.  The review of  systems is otherwise negative.   PAST MEDICAL HISTORY:  1. Previous history of small bowel obstruction.  2. Previous history of abdominal  surgery including a partial colonic      resection for ruptured appendix and ruptured diverticulum.  3. Hypertension.  4. Hyperlipidemia.  5. GERD.  6. Occasional irregular heartbeat, although this does not sound to be      atrial fibrillation.   MEDICATIONS:  1. Crestor 40.  2. HCTZ 12.5.  3. Norvasc/Benazepril 10/20.  4. Aspirin 81.  5. Clonidine 0.1.  6. Prilosec 20.  7. Multivitamin daily.  8. Vitamin B Complex plus C.   ALLERGIES:  PHENERGAN.   SOCIAL HISTORY:  She denies any current tobacco, alcohol, or drug use.  She was a previous smoker years ago.   FAMILY HISTORY:  Noncontributory.   PHYSICAL EXAMINATION:  VITAL SIGNS ON ADMISSION:  Temp 98.7, heart rate  108, blood pressure 101/71, respirations 16, O2 SAT 96% on room air.  GENERAL:  She is alert and oriented x3 in no apparent distress.  HEENT:  Normocephalic, atraumatic, and mucous membranes are moist.  NECK:  She has no carotid bruits.  HEART:  Regular rate and rhythm, a soft 2/6 systolic ejection murmur.  LUNGS:  Clear to auscultation bilaterally.  ABDOMEN:  Soft, slightly distended,  some generalized tenderness with  deep palpation, and absent bowel sounds.  EXTREMITIES:  No clubbing or cyanosis, trace pitting edema.   LABORATORY DATA:  Sodium 140, potassium 3.9, chloride 98, bicarb 28, BUN  20, creatinine 1.16, glucose 198.  White count 16.9, H&H of 15.7 and 45,  MCV of 89, platelet count 380 with 89% shift.  UA notes greater than 300  of protein, trace leukocyte-esterase, 7 to 10 white cells, but rare  bacteria.  Acute abdominal series is as per HPI.   ASSESSMENT AND PLAN:  1. Small bowel obstruction secondary to previous history of abdominal      surgery.  Plan to admit the patient NPO, intravenous proton pump      inhibitor for pain and nausea control, repeat series in the      morning.  Likely we will try to medically manage this.  2. Hypertension.  Put her on intravenous Lopressor.  3. Leukocytosis  secondary to number one.  4. Hyperlipidemia.  Holding p.o. medications.  5. Right lower lobe lung nodule.  A chest x-ray done from one year ago      did not comment on lung nodule, which is not seen.  Will plan to      check a computerized tomography of the chest to further evaluate.      Hollice Espy, M.D.  Electronically Signed     SKK/MEDQ  D:  07/21/2007  T:  07/21/2007  Job:  [id number]   cc:   Georgina Quint. Plotnikov, MD  [address]  [place]  [place] [address]

## 2010-12-02 NOTE — Discharge Summary (Signed)
NAMETagan, [name]            ACCOUNT NO.:  [id number]   MEDICAL RECORD NO.:  [id number]          PATIENT TYPE:  INP   LOCATION:  1538                         FACILITY:  [facility]   PHYSICIAN:  Valerie A. Felicity Coyer, MDDATE OF BIRTH:  06-22-1919   DATE OF ADMISSION:  07/21/2007  DATE OF DISCHARGE:  07/23/2007                               DISCHARGE SUMMARY   DISCHARGE DIAGNOSIS:  1. Small bowel obstruction, resolved with conservative medical      management.  2. Mild hyperglycemia suspect stress reaction.  Hemoglobin A1c pending      at this time.  3. History of hypertension.  4. Dyslipidemia.  5. Abnormal chest x-ray with stable right lower lobe nonspecific      nodules, no change since previous exam December 2007.  No further      workup at this time.  6. Gastroesophageal reflux disease.  Continue proton pump inhibitor.  7. Anemia.  Discharge hemoglobin 10.9.  Iron studies pending at time      of dictation.  8. Mild hypokalemia secondary to gastrointestinal blood loss,      replacement ongoing.   CONSULTATIONS:  None.   DISCHARGE MEDICATIONS:  As prior to admission and include:  1. Aspirin 81 mg daily.  2. Crestor 10 mg daily.  3. Fish oil two tablets b.i.d.  4. Vitamin B complex daily.  5. Vitamin D 1 tablet daily.  6. Prilosec p.o. daily.  7. Clonidine p.r.n. systolic pressure greater than 170.   The patient is asked to hold off on her Lotrel 10/20 and HCTZ 12 mg  daily until follow-up with primary MD next week due to normotensive  pressure off medications at this time.  Discharge blood pressure 126/66.  Hospital follow-up will be arranged by patient with her primary MD, Dr.  Posey Rea in the next seven days.  The patient is also instructed to  call MD or return to the emergency room if recurrence of nausea,  vomiting, inability to tolerate p.o. or worsening pain prior to that  time.   CONDITION ON DISCHARGE:  Medically stable and anxious for discharge  home.   HOSPITAL COURSE BY PROBLEM:  1. Small bowel obstruction.  The patient is an 77 year old, overall      healthy and active woman, who presented to the emergency room with      less than 24 hours of abdominal pain associated with nausea and      vomiting.  One year ago she had a previous history of same      associated with small bowel obstruction presumably due to adhesions      from a history of partial colonic resection for ruptured appendix      and diverticulitis.  Plain films done in the emergency room showed      early evidence of small bowel obstruction, and thus she was      admitted to medicine for conservative management of her symptoms.      As one year ago, she responded quickly to conservative measures.      It was felt that surgery was not needed at this time.  She was left      n.p.o. for 24 hours, and follow-up film the next day showed      improvement in the bowel gas pattern.  She was thus started on a      clear liquid diet which she has tolerated well.  She has had two      bowel movements and no recurrence of the pain.  Films on the second      day follow-up showed resolution with a normal and nonspecific bowel      gas pattern.  The patient is anxious for solid food and for      discharge home.  As she does feel a touch of nausea this morning, I      have instructed the patient that I would prefer another 24 hours of      observation but should she tolerate two meals without nausea,      vomiting or recurrence of the pain, as she is otherwise stable with      normalization of her x-rays and clinical symptoms, will offer      discharge home for follow-up with primary MD next week as      instructed for reevaluation of her need of her usual home medicines      as described above.  No surgery consultation was pursued this      hospitalization.  2. Other medical issues.  The patient's other medical issues are as      listed.  There was a small right lower lobe nodule noted  on chest x-      ray was followed up with a CT of the chest that specifically found      an ill-defined pulmonary nodule which was unchanged from previous      demonstration on a CT done during hospitalization December 2007 as      there is been no change.  No further workup at this time is      planned.  Outpatient follow-up with primary MD as needed.  She was      also noted to be mildly anemic, presumably hydrational in nature as      there is no clinical bleeding or acute blood loss noted, no      hematemesis or melena.  Iron studies have been drawn but are      pending at this time.  Follow-up with primary MD next week for      reevaluation on this as needed.      Valerie A. Felicity Coyer, MD  Electronically Signed     VAL/MEDQ  D:  07/23/2007  T:  07/23/2007  Job:  [id number]

## 2010-12-03 ENCOUNTER — Ambulatory Visit (INDEPENDENT_AMBULATORY_CARE_PROVIDER_SITE_OTHER): Payer: Medicare Other | Admitting: Internal Medicine

## 2010-12-03 ENCOUNTER — Encounter: Payer: Self-pay | Admitting: Internal Medicine

## 2010-12-03 ENCOUNTER — Other Ambulatory Visit (INDEPENDENT_AMBULATORY_CARE_PROVIDER_SITE_OTHER): Payer: Medicare Other

## 2010-12-03 DIAGNOSIS — E875 Hyperkalemia: Secondary | ICD-10-CM

## 2010-12-03 DIAGNOSIS — I4891 Unspecified atrial fibrillation: Secondary | ICD-10-CM

## 2010-12-03 DIAGNOSIS — F411 Generalized anxiety disorder: Secondary | ICD-10-CM

## 2010-12-03 DIAGNOSIS — R634 Abnormal weight loss: Secondary | ICD-10-CM

## 2010-12-03 DIAGNOSIS — R410 Disorientation, unspecified: Secondary | ICD-10-CM

## 2010-12-03 DIAGNOSIS — F29 Unspecified psychosis not due to a substance or known physiological condition: Secondary | ICD-10-CM

## 2010-12-03 LAB — BASIC METABOLIC PANEL
BUN: 14 mg/dL (ref 6–23)
CO2: 29 mEq/L (ref 19–32)
Calcium: 9.7 mg/dL (ref 8.4–10.5)
Glucose, Bld: 77 mg/dL (ref 70–99)
Sodium: 137 mEq/L (ref 135–145)

## 2010-12-03 MED ORDER — OLMESARTAN MEDOXOMIL 20 MG PO TABS: 20.0000 mg | ORAL_TABLET | Freq: Every day | ORAL | Status: DC

## 2010-12-03 MED ORDER — SODIUM POLYSTYRENE SULFONATE 15 GM/60ML PO SUSP: 15.0000 g | Freq: Once | ORAL | Status: AC

## 2010-12-03 NOTE — Assessment & Plan Note (Signed)
Try to cont Remeron

## 2010-12-03 NOTE — Assessment & Plan Note (Signed)
Resolved

## 2010-12-03 NOTE — Progress Notes (Signed)
  Subjective:    Patient ID: [name], female    DOB: [birth year], 77 y.o.   MRN: [id number]  HPI   F/u nausea and BP spikes, anxiety Nausea is gone after she cut back on Toprol and took Remeron once F/u abn labs  Review of Systems  Constitutional: Negative for chills and activity change.  HENT: Negative for congestion and neck pain.   Eyes: Negative for redness.  Respiratory: Negative for cough.   Cardiovascular: Negative for chest pain.  Gastrointestinal: Negative for nausea.  Genitourinary: Negative for dysuria and urgency.  Musculoskeletal: Negative for arthralgias.  Skin: Negative for rash.  Psychiatric/Behavioral: Negative for behavioral problems. The patient is nervous/anxious (better).        Objective:   Physical Exam  Constitutional: She appears well-developed and well-nourished. No distress.  HENT:  Head: Normocephalic.  Right Ear: External ear normal.  Left Ear: External ear normal.  Nose: Nose normal.  Mouth/Throat: Oropharynx is clear and moist.  Eyes: Conjunctivae are normal. Pupils are equal, round, and reactive to light. Right eye exhibits no discharge. Left eye exhibits no discharge.  Neck: Normal range of motion. Neck supple. No JVD present. No tracheal deviation present. No thyromegaly present.  Cardiovascular: Normal rate, regular rhythm and normal heart sounds.   Pulmonary/Chest: No stridor. No respiratory distress. She has no wheezes.  Abdominal: Soft. Bowel sounds are normal. She exhibits no distension and no mass. There is no tenderness. There is no rebound and no guarding.  Musculoskeletal: She exhibits no edema and no tenderness.  Lymphadenopathy:    She has no cervical adenopathy.  Neurological: She displays normal reflexes. No cranial nerve deficit. She exhibits normal muscle tone. Coordination normal.  Skin: No rash noted. No erythema.  Psychiatric: Her behavior is normal. Judgment and thought content normal.       Less anxious        Lab Results  Component Value Date   WBC 5.2 11/27/2010   HGB 12.4 11/27/2010   HCT 36.0 11/27/2010   PLT 300.0 11/27/2010   CHOL 259* 11/27/2010   TRIG 204.0* 11/27/2010   HDL 46.90 11/27/2010   LDLDIRECT 175.0 11/27/2010   ALT 70* 11/27/2010   AST 28 11/27/2010   NA 138 11/27/2010   K 5.9* 11/27/2010   CL 104 11/27/2010   CREATININE 1.2 11/27/2010   BUN 20 11/27/2010   CO2 27 11/27/2010   TSH 0.74 11/27/2010   INR 1.86* 06/12/2010   HGBA1C 7.1* 05/27/2010    Assessment & Plan:  Confusion Resolved  WEIGHT LOSS Wt Readings from Last 3 Encounters:  12/03/10 134 lb (60.782 kg)  11/26/10 133 lb (60.328 kg)  10/15/10 130 lb (58.968 kg)     ANXIETY Try to cont Remeron  ATRIAL FIBRILLATION On Rx  Hyperkalemia Repeat BMET. See meds

## 2010-12-03 NOTE — Assessment & Plan Note (Signed)
Wt Readings from Last 3 Encounters:  12/03/10 134 lb (60.782 kg)  11/26/10 133 lb (60.328 kg)  10/15/10 130 lb (58.968 kg)

## 2010-12-03 NOTE — Assessment & Plan Note (Signed)
On Rx 

## 2010-12-03 NOTE — Assessment & Plan Note (Signed)
Repeat BMET. See meds

## 2010-12-04 ENCOUNTER — Telehealth: Payer: Self-pay | Admitting: Internal Medicine

## 2010-12-04 ENCOUNTER — Other Ambulatory Visit: Payer: Self-pay | Admitting: *Deleted

## 2010-12-04 NOTE — Telephone Encounter (Signed)
Pt informed

## 2010-12-04 NOTE — Telephone Encounter (Signed)
x

## 2010-12-04 NOTE — Telephone Encounter (Signed)
[name], please, inform patient that her Potassium is better. Take [name] qd x 2 d. Keep [name] Thx

## 2010-12-04 NOTE — Telephone Encounter (Signed)
Left mess for patient to call back.  

## 2010-12-05 NOTE — H&P (Signed)
NAMECIRIA, BERNARDINI            ACCOUNT NO.:  [id number]   MEDICAL RECORD NO.:  [id number]          PATIENT TYPE:  INP   LOCATION:  0103                         FACILITY:  [facility]   PHYSICIAN:  Adolph Pollack, M.D.DATE OF BIRTH:  September 01, 1918   DATE OF ADMISSION:  07/01/2006  DATE OF DISCHARGE:                              HISTORY & PHYSICAL   CHIEF COMPLAINT:  Abdominal pain, nausea and vomiting.   HISTORY OF PRESENT ILLNESS:  This is an 77 year old female in her normal  state of health.  Last night, she developed some diffuse abdominal  discomfort with distension.  She induced vomiting and felt a little  better.  She had some of the pain waxing and waning all night and had  some spontaneous vomiting this morning.  She also had a small bowel  movement this morning.  She presented to the hospital for evaluation and  actually at that time was feeling some better.  She did receive a dose  of Dilaudid about 6 hours ago and currently is completely pain free.  She underwent an evaluation including a CT scan which showed signs  suggestive of a possible partial small-bowel obstruction and for this  reason, I was called to see her.  Of note was that she has had two very  rich meals, yesterday evening and the evening before which is not  something she usually does.   PAST MEDICAL HISTORY:  1. 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  2. Hypertension.  3. Gastroesophageal reflux disease.  4. Perforated sigmoid diverticulitis.  5. Hypercholesterolemia.  6. Atrial fibrillation following [employer] administrative in the past.   PAST SURGICAL HISTORY:  1. Tonsillectomy.  2. Hartmann procedure.  3. Appendectomy.  4. Colostomy closure and cholecystectomy.   ALLERGIES:  PHENERGAN.   MEDICATIONS:  Crestor, hydrochlorothiazide, aspirin, clonidine,  Prilosec, amlodipine.   SOCIAL HISTORY:  She is a former smoker.  Occasional alcohol use.  She  is independent.   REVIEW OF SYSTEMS:  CARDIAC:  She denies any heart disease or valvular  disease.  PULMONARY:  She denies any pneumonia, asthma or COPD.  GI: She  denies any peptic ulcer disease, hepatitis.  She has the diverticulitis  as described above.  GU:  No kidney stones, urinary tract infections.  ENDOCRINE:  No known diabetes.  NEUROLOGIC:  No strokes or seizures.  HEMATOLOGIC:  No bleeding disorder or blood clots.   PHYSICAL EXAMINATION:  GENERAL:  Well-developed, well-nourished female  who looks younger than her stated age.  VITAL SIGNS:  Temperature is 98.4, blood pressure is 117/59, pulse 80.  She is in no acute distress.  HEENT:  Extraocular motions intact, no icterus.  NECK:  Neck is supple without masses.  RESPIRATORY:  Breath sounds are equal and clear.  Respirations  unlabored.  CARDIOVASCULAR:  Demonstrates a regular rate, regular rhythm.  I do not  hear a murmur.  ABDOMEN:  Soft, nontender, nondistended.  She has active bowel sounds.  There is a long midline scar without evidence of hernia.  There is a  left lower quadrant scar without evidence of hernia.  No obvious masses.  GENITALIA:  No palpable hernias.  MUSCULOSKELETAL:  She had good muscle tone.  No edema.  SKIN:  No jaundice.   LABORATORY DATA AND X-RAY FINDINGS:  Significant for a glucose of 224.  UA negative.  White count 13,400, hemoglobin 14.8.   CT scan reviewed.  There was a focal area in the left upper quadrant of  some mildly thickened dilated small bowel.  There is gas throughout the  colon.  There is a small amount of free fluid present.  There is a  stable mesenteric mass.   IMPRESSION:  1. Abdominal pain with nausea and vomiting.  Differential diagnosis      could include partial small-bowel obstruction versus viral      enteritis with focal ileus.  Currently, she is asymptomatic.  2. Hyperglycemia which may be stress related.  3. Hypertension.   PLAN:  We will go and admit her and hydrate her and observe  her for now.  Place her on bowel rest.  Will check labs and x-ray in the morning.  I  have discussed this with her.      Adolph Pollack, M.D.  Electronically Signed     TJR/MEDQ  D:  07/01/2006  T:  07/01/2006  Job:  [id number]   cc:   Georgina Quint. Plotnikov, MD  [address]  [place]  [place] [address]

## 2010-12-30 ENCOUNTER — Encounter: Payer: Self-pay | Admitting: Internal Medicine

## 2011-01-19 ENCOUNTER — Encounter: Payer: Self-pay | Admitting: Internal Medicine

## 2011-01-19 ENCOUNTER — Ambulatory Visit (INDEPENDENT_AMBULATORY_CARE_PROVIDER_SITE_OTHER): Payer: Medicare Other | Admitting: Internal Medicine

## 2011-01-19 VITALS — BP 118/70 | HR 80 | Temp 98.1°F | Resp 16 | Ht 64.5 in | Wt 137.0 lb

## 2011-01-19 DIAGNOSIS — F29 Unspecified psychosis not due to a substance or known physiological condition: Secondary | ICD-10-CM

## 2011-01-19 DIAGNOSIS — Z8719 Personal history of other diseases of the digestive system: Secondary | ICD-10-CM

## 2011-01-19 DIAGNOSIS — J449 Chronic obstructive pulmonary disease, unspecified: Secondary | ICD-10-CM

## 2011-01-19 DIAGNOSIS — E785 Hyperlipidemia, unspecified: Secondary | ICD-10-CM

## 2011-01-19 DIAGNOSIS — R410 Disorientation, unspecified: Secondary | ICD-10-CM

## 2011-01-19 DIAGNOSIS — I4891 Unspecified atrial fibrillation: Secondary | ICD-10-CM

## 2011-01-19 DIAGNOSIS — I2510 Atherosclerotic heart disease of native coronary artery without angina pectoris: Secondary | ICD-10-CM

## 2011-01-19 DIAGNOSIS — I739 Peripheral vascular disease, unspecified: Secondary | ICD-10-CM

## 2011-01-19 DIAGNOSIS — I10 Essential (primary) hypertension: Secondary | ICD-10-CM

## 2011-01-19 NOTE — Assessment & Plan Note (Signed)
Aorta US

## 2011-01-19 NOTE — Assessment & Plan Note (Signed)
On Rx 

## 2011-01-19 NOTE — Assessment & Plan Note (Signed)
Resolved

## 2011-01-19 NOTE — Assessment & Plan Note (Signed)
Doing well 

## 2011-01-19 NOTE — Assessment & Plan Note (Signed)
Doing well now 

## 2011-01-19 NOTE — Assessment & Plan Note (Signed)
Cont w/Rx and Coumadin

## 2011-01-19 NOTE — Progress Notes (Signed)
  Subjective:    Patient ID: [name], female    DOB: [birth year], 77 y.o.   MRN: [id number]  HPI    The patient presents for a follow-up of  chronic hypertension, chronic dyslipidemia, A fib controlled with medicines Not taking Crestor.   Review of Systems  Constitutional: Negative for chills and activity change.  HENT: Negative for congestion and neck pain.   Eyes: Negative for redness.  Respiratory: Negative for cough.   Cardiovascular: Negative for chest pain.  Gastrointestinal: Negative for nausea.  Genitourinary: Negative for dysuria and urgency.  Musculoskeletal: Negative for arthralgias.  Skin: Negative for rash.  Psychiatric/Behavioral: Negative for behavioral problems. The patient is nervous/anxious (better).        Objective:   Physical Exam  Constitutional: She appears well-developed and well-nourished. No distress.  HENT:  Head: Normocephalic.  Right Ear: External ear normal.  Left Ear: External ear normal.  Nose: Nose normal.  Mouth/Throat: Oropharynx is clear and moist.  Eyes: Conjunctivae are normal. Pupils are equal, round, and reactive to light. Right eye exhibits no discharge. Left eye exhibits no discharge.  Neck: Normal range of motion. Neck supple. No JVD present. No tracheal deviation present. No thyromegaly present.  Cardiovascular: Normal rate, regular rhythm and normal heart sounds.   Pulmonary/Chest: No stridor. No respiratory distress. She has no wheezes.  Abdominal: Soft. Bowel sounds are normal. She exhibits no distension and no mass. There is no tenderness. There is no rebound and no guarding.  Musculoskeletal: She exhibits no edema and no tenderness.  Lymphadenopathy:    She has no cervical adenopathy.  Neurological: She displays normal reflexes. No cranial nerve deficit. She exhibits normal muscle tone. Coordination normal.  Skin: No rash noted. No erythema.  Psychiatric: Her behavior is normal. Judgment and thought content normal.         Less anxious           Assessment & Plan:   Restart Cresto - stop if GI sx's

## 2011-01-28 ENCOUNTER — Other Ambulatory Visit: Payer: Self-pay | Admitting: Internal Medicine

## 2011-04-02 ENCOUNTER — Other Ambulatory Visit: Payer: Self-pay | Admitting: *Deleted

## 2011-04-02 MED ORDER — OLMESARTAN MEDOXOMIL 20 MG PO TABS: 20.0000 mg | ORAL_TABLET | Freq: Every day | ORAL | Status: DC

## 2011-04-09 LAB — BASIC METABOLIC PANEL
BUN: 20
CO2: 28
Calcium: 10.3
Calcium: 8.5
Creatinine, Ser: 0.85
Creatinine, Ser: 1.16
GFR calc Af Amer: >60
GFR calc non Af Amer: 44 — ABNORMAL LOW
GFR calc non Af Amer: >60
Glucose, Bld: 108 — ABNORMAL HIGH
Glucose, Bld: 198 — ABNORMAL HIGH

## 2011-04-09 LAB — URINE CULTURE

## 2011-04-09 LAB — URINALYSIS, ROUTINE W REFLEX MICROSCOPIC
Glucose, UA: NEGATIVE
Hgb urine dipstick: NEGATIVE
Protein, ur: >300 — AB
Specific Gravity, Urine: 1.03
Urobilinogen, UA: 1

## 2011-04-09 LAB — CBC
HCT: 34.9 — ABNORMAL LOW
HCT: 45.2
Hemoglobin: 12.2
MCHC: 34.4
MCHC: 35
Platelets: 269
Platelets: 380
RDW: 12.9
RDW: 13
RDW: 13.3
WBC: 16.9 — ABNORMAL HIGH

## 2011-04-09 LAB — URINE MICROSCOPIC-ADD ON

## 2011-04-09 LAB — DIFFERENTIAL
Basophils Absolute: 0.1
Eosinophils Relative: 0
Lymphocytes Relative: 8 — ABNORMAL LOW
Lymphs Abs: 1.3
Neutrophils Relative %: 89 — ABNORMAL HIGH

## 2011-04-09 LAB — IRON AND TIBC
Iron: 38 — ABNORMAL LOW
Saturation Ratios: 15 — ABNORMAL LOW
TIBC: 248 — ABNORMAL LOW

## 2011-04-16 ENCOUNTER — Other Ambulatory Visit (INDEPENDENT_AMBULATORY_CARE_PROVIDER_SITE_OTHER): Payer: Medicare Other

## 2011-04-16 ENCOUNTER — Other Ambulatory Visit: Payer: Self-pay | Admitting: Internal Medicine

## 2011-04-16 DIAGNOSIS — I4891 Unspecified atrial fibrillation: Secondary | ICD-10-CM

## 2011-04-16 DIAGNOSIS — I2510 Atherosclerotic heart disease of native coronary artery without angina pectoris: Secondary | ICD-10-CM

## 2011-04-17 LAB — COMPREHENSIVE METABOLIC PANEL
AST: 19 U/L (ref 0–37)
Albumin: 4.3 g/dL (ref 3.5–5.2)
BUN: 15 mg/dL (ref 6–23)
CO2: 26 mEq/L (ref 19–32)
Calcium: 9.3 mg/dL (ref 8.4–10.5)
Chloride: 105 mEq/L (ref 96–112)
GFR: 40.13 mL/min — ABNORMAL LOW (ref >60.00)
Glucose, Bld: 80 mg/dL (ref 70–99)
Potassium: 5.6 mEq/L — ABNORMAL HIGH (ref 3.5–5.1)

## 2011-04-17 LAB — LIPID PANEL
Cholesterol: 203 mg/dL — ABNORMAL HIGH (ref 0–200)
Total CHOL/HDL Ratio: 4

## 2011-04-17 LAB — TSH: TSH: 0.94 u[IU]/mL (ref 0.35–5.50)

## 2011-04-21 ENCOUNTER — Encounter: Payer: Self-pay | Admitting: Internal Medicine

## 2011-04-21 ENCOUNTER — Ambulatory Visit (INDEPENDENT_AMBULATORY_CARE_PROVIDER_SITE_OTHER): Payer: Medicare Other | Admitting: Internal Medicine

## 2011-04-21 VITALS — BP 142/88 | HR 80 | Temp 97.4°F | Resp 16 | Wt 138.0 lb

## 2011-04-21 DIAGNOSIS — W1800XA Striking against unspecified object with subsequent fall, initial encounter: Secondary | ICD-10-CM | POA: Insufficient documentation

## 2011-04-21 DIAGNOSIS — E785 Hyperlipidemia, unspecified: Secondary | ICD-10-CM

## 2011-04-21 DIAGNOSIS — I10 Essential (primary) hypertension: Secondary | ICD-10-CM

## 2011-04-21 DIAGNOSIS — R634 Abnormal weight loss: Secondary | ICD-10-CM

## 2011-04-21 DIAGNOSIS — W1809XA Striking against other object with subsequent fall, initial encounter: Secondary | ICD-10-CM

## 2011-04-21 DIAGNOSIS — E875 Hyperkalemia: Secondary | ICD-10-CM

## 2011-04-21 DIAGNOSIS — I2510 Atherosclerotic heart disease of native coronary artery without angina pectoris: Secondary | ICD-10-CM

## 2011-04-21 DIAGNOSIS — Z23 Encounter for immunization: Secondary | ICD-10-CM

## 2011-04-21 DIAGNOSIS — I739 Peripheral vascular disease, unspecified: Secondary | ICD-10-CM

## 2011-04-21 MED ORDER — DILTIAZEM HCL ER COATED BEADS 120 MG PO CP24: 120.0000 mg | ORAL_CAPSULE | Freq: Two times a day (BID) | ORAL | Status: DC

## 2011-04-21 NOTE — Assessment & Plan Note (Signed)
Continue with current prescription therapy as reflected on the Med list.  

## 2011-04-21 NOTE — Progress Notes (Signed)
  Subjective:    Patient ID: [name], female    DOB: 10/02/1918, 77 y.o.   MRN: [id number]  HPI  The patient presents for a follow-up of  chronic hypertension, chronic dyslipidemia, PVD controlled with medicines F/u wt loss - better C/o a fall - bruised up  Wt Readings from Last 3 Encounters:  04/21/11 138 lb (62.596 kg)  01/19/11 137 lb (62.143 kg)  12/03/10 134 lb (60.782 kg)        Review of Systems  Constitutional: Negative for chills, activity change, appetite change, fatigue and unexpected weight change.  HENT: Negative for congestion, mouth sores and sinus pressure.   Eyes: Negative for visual disturbance.  Respiratory: Negative for cough and chest tightness.   Gastrointestinal: Negative for nausea and abdominal pain.  Genitourinary: Negative for frequency, difficulty urinating and vaginal pain.  Musculoskeletal: Negative for back pain and gait problem.  Skin: Negative for pallor and rash.  Neurological: Negative for dizziness, tremors, weakness, numbness and headaches.  Hematological: Bruises/bleeds easily.  Psychiatric/Behavioral: Negative for suicidal ideas, confusion and sleep disturbance.       Objective:   Physical Exam  Constitutional: She appears well-developed and well-nourished. No distress.  HENT:  Head: Normocephalic.  Right Ear: External ear normal.  Left Ear: External ear normal.  Nose: Nose normal.  Mouth/Throat: Oropharynx is clear and moist.  Eyes: Conjunctivae are normal. Pupils are equal, round, and reactive to light. Right eye exhibits no discharge. Left eye exhibits no discharge.  Neck: Normal range of motion. Neck supple. No JVD present. No tracheal deviation present. No thyromegaly present.  Cardiovascular: Normal rate, regular rhythm and normal heart sounds.   Pulmonary/Chest: No stridor. No respiratory distress. She has no wheezes.  Abdominal: Soft. Bowel sounds are normal. She exhibits no distension and no mass. There is no  tenderness. There is no rebound and no guarding.  Musculoskeletal: She exhibits tenderness (B shins and R elbow are bruised). She exhibits no edema.  Lymphadenopathy:    She has no cervical adenopathy.  Neurological: She displays normal reflexes. No cranial nerve deficit. She exhibits normal muscle tone. Coordination normal.  Skin: No rash noted. No erythema.  Psychiatric: She has a normal mood and affect. Her behavior is normal. Judgment and thought content normal.   Lab Results  Component Value Date   WBC 5.2 11/27/2010   HGB 12.4 11/27/2010   HCT 36.0 11/27/2010   PLT 300.0 11/27/2010   CHOL 203* 04/16/2011   TRIG 197.0* 04/16/2011   HDL 50.00 04/16/2011   LDLDIRECT 123.0 04/16/2011   ALT 14 04/16/2011   AST 19 04/16/2011   NA 140 04/16/2011   K 5.6* 04/16/2011   CL 105 04/16/2011   CREATININE 1.3* 04/16/2011   BUN 15 04/16/2011   CO2 26 04/16/2011   TSH 0.94 04/16/2011   INR 1.86* 06/12/2010   HGBA1C 7.1* 05/27/2010          Assessment & Plan:   Handicapped form was filled out

## 2011-04-21 NOTE — Assessment & Plan Note (Signed)
2012 likely related to Benicar Will d/c Benicar

## 2011-04-21 NOTE — Assessment & Plan Note (Signed)
Resolved Cont Remeron

## 2011-04-21 NOTE — Assessment & Plan Note (Signed)
Continue with current prescription therapy as reflected on the Med list. BP Readings from Last 3 Encounters:  04/21/11 142/88  01/19/11 118/70  12/03/10 130/68

## 2011-05-22 ENCOUNTER — Other Ambulatory Visit (INDEPENDENT_AMBULATORY_CARE_PROVIDER_SITE_OTHER): Payer: Medicare Other

## 2011-05-22 DIAGNOSIS — E875 Hyperkalemia: Secondary | ICD-10-CM

## 2011-05-22 LAB — BASIC METABOLIC PANEL
BUN: 17 mg/dL (ref 6–23)
CO2: 30 mEq/L (ref 19–32)
Chloride: 102 mEq/L (ref 96–112)
Potassium: 4.5 mEq/L (ref 3.5–5.1)

## 2011-05-25 ENCOUNTER — Ambulatory Visit (INDEPENDENT_AMBULATORY_CARE_PROVIDER_SITE_OTHER): Payer: Medicare Other | Admitting: Internal Medicine

## 2011-05-25 ENCOUNTER — Encounter: Payer: Self-pay | Admitting: Internal Medicine

## 2011-05-25 VITALS — BP 126/82 | HR 84 | Temp 97.8°F | Resp 16 | Wt 139.0 lb

## 2011-05-25 DIAGNOSIS — I10 Essential (primary) hypertension: Secondary | ICD-10-CM

## 2011-05-25 DIAGNOSIS — E875 Hyperkalemia: Secondary | ICD-10-CM

## 2011-05-25 DIAGNOSIS — F411 Generalized anxiety disorder: Secondary | ICD-10-CM

## 2011-05-25 DIAGNOSIS — I2510 Atherosclerotic heart disease of native coronary artery without angina pectoris: Secondary | ICD-10-CM

## 2011-05-25 NOTE — Progress Notes (Signed)
  Subjective:    Patient ID: [name], female    DOB: 04-03-1919, 77 y.o.   MRN: [id number]  HPI  The patient presents for a follow-up of  Hyperkalemia, chronic hypertension, chronic dyslipidemia, CRI controlled with medicines  BP Readings from Last 3 Encounters:  05/25/11 126/82  04/21/11 142/88  01/19/11 118/70     Review of Systems  Constitutional: Negative for chills, activity change, appetite change, fatigue and unexpected weight change.  HENT: Negative for congestion, mouth sores and sinus pressure.   Eyes: Negative for visual disturbance.  Respiratory: Negative for cough and chest tightness.   Gastrointestinal: Negative for nausea and abdominal pain.  Genitourinary: Negative for frequency, difficulty urinating and vaginal pain.  Musculoskeletal: Negative for back pain and gait problem.  Skin: Negative for pallor and rash.  Neurological: Negative for dizziness, tremors, weakness, numbness and headaches.  Psychiatric/Behavioral: Negative for confusion and sleep disturbance.       Objective:   Physical Exam  Constitutional: She appears well-developed and well-nourished. No distress.  HENT:  Head: Normocephalic.  Right Ear: External ear normal.  Left Ear: External ear normal.  Nose: Nose normal.  Mouth/Throat: Oropharynx is clear and moist.  Eyes: Conjunctivae are normal. Pupils are equal, round, and reactive to light. Right eye exhibits no discharge. Left eye exhibits no discharge.  Neck: Normal range of motion. Neck supple. No JVD present. No tracheal deviation present. No thyromegaly present.  Cardiovascular: Normal rate, regular rhythm and normal heart sounds.   Pulmonary/Chest: No stridor. No respiratory distress. She has no wheezes.  Abdominal: Soft. Bowel sounds are normal. She exhibits no distension and no mass. There is no tenderness. There is no rebound and no guarding.  Musculoskeletal: She exhibits no edema and no tenderness.  Lymphadenopathy:    She  has no cervical adenopathy.  Neurological: She displays normal reflexes. No cranial nerve deficit. She exhibits normal muscle tone. Coordination normal.  Skin: No rash noted. No erythema.  Psychiatric: She has a normal mood and affect. Her behavior is normal. Judgment and thought content normal.     Lab Results  Component Value Date   WBC 5.2 11/27/2010   HGB 12.4 11/27/2010   HCT 36.0 11/27/2010   PLT 300.0 11/27/2010   GLUCOSE 96 05/22/2011   CHOL 203* 04/16/2011   TRIG 197.0* 04/16/2011   HDL 50.00 04/16/2011   LDLDIRECT 123.0 04/16/2011   LDLCALC 103* 08/25/2010   ALT 14 04/16/2011   AST 19 04/16/2011   NA 139 05/22/2011   K 4.5 05/22/2011   CL 102 05/22/2011   CREATININE 1.1 05/22/2011   BUN 17 05/22/2011   CO2 30 05/22/2011   TSH 0.94 04/16/2011   INR 1.86* 06/12/2010   HGBA1C 7.1* 05/27/2010        Assessment & Plan:

## 2011-05-25 NOTE — Assessment & Plan Note (Signed)
Resolved

## 2011-05-25 NOTE — Assessment & Plan Note (Signed)
Continue with current prescription therapy as reflected on the Med list.  

## 2011-07-05 ENCOUNTER — Other Ambulatory Visit: Payer: Self-pay | Admitting: Internal Medicine

## 2011-07-29 ENCOUNTER — Other Ambulatory Visit: Payer: Self-pay | Admitting: Internal Medicine

## 2011-08-24 ENCOUNTER — Other Ambulatory Visit (INDEPENDENT_AMBULATORY_CARE_PROVIDER_SITE_OTHER): Payer: Medicare Other

## 2011-08-24 DIAGNOSIS — E875 Hyperkalemia: Secondary | ICD-10-CM

## 2011-08-24 LAB — BASIC METABOLIC PANEL
CO2: 31 mEq/L (ref 19–32)
Calcium: 9.5 mg/dL (ref 8.4–10.5)
Glucose, Bld: 109 mg/dL — ABNORMAL HIGH (ref 70–99)
Sodium: 138 mEq/L (ref 135–145)

## 2011-08-25 ENCOUNTER — Encounter: Payer: Self-pay | Admitting: Internal Medicine

## 2011-08-25 ENCOUNTER — Ambulatory Visit (INDEPENDENT_AMBULATORY_CARE_PROVIDER_SITE_OTHER): Payer: Medicare Other | Admitting: Internal Medicine

## 2011-08-25 DIAGNOSIS — F411 Generalized anxiety disorder: Secondary | ICD-10-CM

## 2011-08-25 DIAGNOSIS — R1084 Generalized abdominal pain: Secondary | ICD-10-CM

## 2011-08-25 DIAGNOSIS — I10 Essential (primary) hypertension: Secondary | ICD-10-CM

## 2011-08-25 DIAGNOSIS — K56609 Unspecified intestinal obstruction, unspecified as to partial versus complete obstruction: Secondary | ICD-10-CM

## 2011-08-25 DIAGNOSIS — I4891 Unspecified atrial fibrillation: Secondary | ICD-10-CM

## 2011-08-25 DIAGNOSIS — E785 Hyperlipidemia, unspecified: Secondary | ICD-10-CM

## 2011-08-25 DIAGNOSIS — I2510 Atherosclerotic heart disease of native coronary artery without angina pectoris: Secondary | ICD-10-CM

## 2011-08-25 NOTE — Assessment & Plan Note (Signed)
Continue with current prescription therapy as reflected on the Med list.  

## 2011-08-25 NOTE — Progress Notes (Signed)
  Subjective:    Patient ID: [name], female    DOB: [birth year], 78 y.o.   MRN: [id number]  HPI   The patient presents for a follow-up of  chronic hypertension, chronic dyslipidemia, SBO, PVD controlled with medicines   Review of Systems  Constitutional: Positive for fatigue. Negative for chills, activity change, appetite change and unexpected weight change.  HENT: Negative for congestion, mouth sores and sinus pressure.   Eyes: Negative for visual disturbance.  Respiratory: Negative for cough and chest tightness.   Gastrointestinal: Negative for nausea and abdominal pain.  Genitourinary: Negative for frequency, difficulty urinating and vaginal pain.  Musculoskeletal: Negative for back pain and gait problem.  Skin: Negative for pallor and rash.  Neurological: Negative for dizziness, tremors, weakness, numbness and headaches.  Psychiatric/Behavioral: Negative for suicidal ideas, confusion and sleep disturbance.       Objective:   Physical Exam  Constitutional: She appears well-developed. No distress.  HENT:  Head: Normocephalic.  Right Ear: External ear normal.  Left Ear: External ear normal.  Nose: Nose normal.  Mouth/Throat: Oropharynx is clear and moist.  Eyes: Conjunctivae are normal. Pupils are equal, round, and reactive to light. Right eye exhibits no discharge. Left eye exhibits no discharge.  Neck: Normal range of motion. Neck supple. No JVD present. No tracheal deviation present. No thyromegaly present.  Cardiovascular: Normal rate and normal heart sounds.        irreg irreg  Pulmonary/Chest: No stridor. No respiratory distress. She has no wheezes.  Abdominal: Soft. Bowel sounds are normal. She exhibits no distension and no mass. There is no tenderness. There is no rebound and no guarding.  Musculoskeletal: She exhibits no edema and no tenderness.  Lymphadenopathy:    She has no cervical adenopathy.  Neurological: She displays normal reflexes. No cranial  nerve deficit. She exhibits normal muscle tone. Coordination normal.  Skin: No rash noted. No erythema.  Psychiatric: She has a normal mood and affect. Her behavior is normal. Judgment and thought content normal.          Assessment & Plan:

## 2011-08-25 NOTE — Assessment & Plan Note (Signed)
No relapse 

## 2011-08-25 NOTE — Assessment & Plan Note (Signed)
Poss ischemic in nature; SBO Doing well lately

## 2011-09-14 ENCOUNTER — Other Ambulatory Visit: Payer: Self-pay | Admitting: Internal Medicine

## 2011-11-25 ENCOUNTER — Encounter: Payer: Self-pay | Admitting: Internal Medicine

## 2011-11-25 ENCOUNTER — Ambulatory Visit (INDEPENDENT_AMBULATORY_CARE_PROVIDER_SITE_OTHER): Payer: Medicare Other | Admitting: Internal Medicine

## 2011-11-25 VITALS — BP 130/90 | HR 80 | Temp 98.6°F | Resp 16 | Wt 135.0 lb

## 2011-11-25 DIAGNOSIS — R634 Abnormal weight loss: Secondary | ICD-10-CM

## 2011-11-25 DIAGNOSIS — I10 Essential (primary) hypertension: Secondary | ICD-10-CM

## 2011-11-25 DIAGNOSIS — I739 Peripheral vascular disease, unspecified: Secondary | ICD-10-CM

## 2011-11-25 DIAGNOSIS — K219 Gastro-esophageal reflux disease without esophagitis: Secondary | ICD-10-CM

## 2011-11-25 DIAGNOSIS — E785 Hyperlipidemia, unspecified: Secondary | ICD-10-CM

## 2011-11-25 DIAGNOSIS — I2510 Atherosclerotic heart disease of native coronary artery without angina pectoris: Secondary | ICD-10-CM

## 2011-11-25 NOTE — Assessment & Plan Note (Signed)
Continue with current prescription therapy as reflected on the Med list.  

## 2011-11-25 NOTE — Assessment & Plan Note (Signed)
Wt Readings from Last 3 Encounters:  11/25/11 135 lb (61.236 kg)  08/25/11 140 lb (63.504 kg)  05/25/11 139 lb (63.05 kg)  watching wt's

## 2011-11-25 NOTE — Patient Instructions (Signed)
Wt Readings from Last 3 Encounters:  11/25/11 135 lb (61.236 kg)  08/25/11 140 lb (63.504 kg)  05/25/11 139 lb (63.05 kg)   BP Readings from Last 3 Encounters:  11/25/11 130/90  08/25/11 140/90  05/25/11 126/82

## 2011-11-25 NOTE — Progress Notes (Signed)
Patient ID: [name], female   DOB: August 09, 1918, 78 y.o.   MRN: [id number]  Subjective:    Patient ID: [name] Public, female    DOB: 1918/09/12, 78 y.o.   MRN: [id number]  HPI   The patient presents for a follow-up of  chronic hypertension, chronic dyslipidemia, SBO, PVD controlled with medicines F/u wt loss - watching  BP Readings from Last 3 Encounters:  11/25/11 130/90  08/25/11 140/90  05/25/11 126/82   Wt Readings from Last 3 Encounters:  11/25/11 135 lb (61.236 kg)  08/25/11 140 lb (63.504 kg)  05/25/11 139 lb (63.05 kg)       Review of Systems  Constitutional: Positive for fatigue. Negative for chills, activity change, appetite change and unexpected weight change.  HENT: Negative for congestion, mouth sores and sinus pressure.   Eyes: Negative for visual disturbance.  Respiratory: Negative for cough and chest tightness.   Gastrointestinal: Negative for nausea and abdominal pain.  Genitourinary: Negative for frequency, difficulty urinating and vaginal pain.  Musculoskeletal: Negative for back pain and gait problem.  Skin: Negative for pallor and rash.  Neurological: Negative for dizziness, tremors, weakness, numbness and headaches.  Psychiatric/Behavioral: Negative for suicidal ideas, confusion and sleep disturbance.       Objective:   Physical Exam  Constitutional: She appears well-developed. No distress.  HENT:  Head: Normocephalic.  Right Ear: External ear normal.  Left Ear: External ear normal.  Nose: Nose normal.  Mouth/Throat: Oropharynx is clear and moist.  Eyes: Conjunctivae are normal. Pupils are equal, round, and reactive to light. Right eye exhibits no discharge. Left eye exhibits no discharge.  Neck: Normal range of motion. Neck supple. No JVD present. No tracheal deviation present. No thyromegaly present.  Cardiovascular: Normal rate and normal heart sounds.        irreg irreg  Pulmonary/Chest: No stridor. No respiratory distress. She  has no wheezes.  Abdominal: Soft. Bowel sounds are normal. She exhibits no distension and no mass. There is no tenderness. There is no rebound and no guarding.  Musculoskeletal: She exhibits no edema and no tenderness.  Lymphadenopathy:    She has no cervical adenopathy.  Neurological: She displays normal reflexes. No cranial nerve deficit. She exhibits normal muscle tone. Coordination normal.  Skin: No rash noted. No erythema.  Psychiatric: She has a normal mood and affect. Her behavior is normal. Judgment and thought content normal.    Lab Results  Component Value Date   WBC 5.2 11/27/2010   HGB 12.4 11/27/2010   HCT 36.0 11/27/2010   PLT 300.0 11/27/2010   GLUCOSE 109* 08/24/2011   CHOL 203* 04/16/2011   TRIG 197.0* 04/16/2011   HDL 50.00 04/16/2011   LDLDIRECT 123.0 04/16/2011   LDLCALC 103* 08/25/2010   ALT 14 04/16/2011   AST 19 04/16/2011   NA 138 08/24/2011   K 4.7 08/24/2011   CL 100 08/24/2011   CREATININE 1.1 08/24/2011   BUN 18 08/24/2011   CO2 31 08/24/2011   TSH 0.94 04/16/2011   INR 1.86* 06/12/2010   HGBA1C 7.1* 05/27/2010         Assessment & Plan:

## 2011-12-07 ENCOUNTER — Other Ambulatory Visit: Payer: Self-pay | Admitting: Internal Medicine

## 2012-01-08 ENCOUNTER — Emergency Department (HOSPITAL_COMMUNITY)
Admission: EM | Admit: 2012-01-08 | Discharge: 2012-01-08 | Disposition: A | Discharge status: Home / Self Care | Payer: Medicare Other | Attending: Emergency Medicine | Admitting: Emergency Medicine

## 2012-01-08 ENCOUNTER — Encounter (HOSPITAL_COMMUNITY): Payer: Self-pay | Admitting: Emergency Medicine

## 2012-01-08 ENCOUNTER — Emergency Department (HOSPITAL_COMMUNITY): Payer: Medicare Other

## 2012-01-08 DIAGNOSIS — I4891 Unspecified atrial fibrillation: Secondary | ICD-10-CM | POA: Insufficient documentation

## 2012-01-08 DIAGNOSIS — R109 Unspecified abdominal pain: Secondary | ICD-10-CM | POA: Insufficient documentation

## 2012-01-08 DIAGNOSIS — R112 Nausea with vomiting, unspecified: Secondary | ICD-10-CM | POA: Insufficient documentation

## 2012-01-08 DIAGNOSIS — I10 Essential (primary) hypertension: Secondary | ICD-10-CM | POA: Insufficient documentation

## 2012-01-08 DIAGNOSIS — Z7901 Long term (current) use of anticoagulants: Secondary | ICD-10-CM | POA: Insufficient documentation

## 2012-01-08 LAB — DIFFERENTIAL
Basophils Relative: 0 % (ref 0–1)
Eosinophils Absolute: 0.1 10*3/uL (ref 0.0–0.7)
Monocytes Absolute: 0.4 10*3/uL (ref 0.1–1.0)
Monocytes Relative: 4 % (ref 3–12)
Neutrophils Relative %: 85 % — ABNORMAL HIGH (ref 43–77)

## 2012-01-08 LAB — POCT I-STAT, CHEM 8
BUN: 11 mg/dL (ref 6–23)
Calcium, Ion: 1.18 mmol/L (ref 1.12–1.32)
Chloride: 94 mEq/L — ABNORMAL LOW (ref 96–112)
Creatinine, Ser: 1 mg/dL (ref 0.50–1.10)
Glucose, Bld: 149 mg/dL — ABNORMAL HIGH (ref 70–99)
HCT: 45 % (ref 36.0–46.0)
Potassium: 3.9 mEq/L (ref 3.5–5.1)

## 2012-01-08 LAB — HEPATIC FUNCTION PANEL
Alkaline Phosphatase: 93 U/L (ref 39–117)
Bilirubin, Direct: 0.1 mg/dL (ref 0.0–0.3)
Indirect Bilirubin: 0.4 mg/dL (ref 0.3–0.9)
Total Bilirubin: 0.5 mg/dL (ref 0.3–1.2)

## 2012-01-08 LAB — URINE MICROSCOPIC-ADD ON

## 2012-01-08 LAB — URINALYSIS, ROUTINE W REFLEX MICROSCOPIC
Glucose, UA: NEGATIVE mg/dL
Hgb urine dipstick: NEGATIVE
Leukocytes, UA: NEGATIVE
Protein, ur: 100 mg/dL — AB
Specific Gravity, Urine: 1.017 (ref 1.005–1.030)

## 2012-01-08 LAB — CBC
HCT: 41.8 % (ref 36.0–46.0)
Hemoglobin: 14.5 g/dL (ref 12.0–15.0)
MCH: 31 pg (ref 26.0–34.0)
MCHC: 34.7 g/dL (ref 30.0–36.0)

## 2012-01-08 LAB — PROTIME-INR: Prothrombin Time: 30.3 seconds — ABNORMAL HIGH (ref 11.6–15.2)

## 2012-01-08 MED ORDER — ONDANSETRON HCL 4 MG/2ML IJ SOLN: 4.0000 mg | Freq: Four times a day (QID) | INTRAMUSCULAR | Status: DC | PRN

## 2012-01-08 MED ORDER — ONDANSETRON 4 MG PO TBDP: 4.0000 mg | ORAL_TABLET | Freq: Three times a day (TID) | ORAL | Status: AC | PRN

## 2012-01-08 MED ORDER — NAPROXEN 500 MG PO TABS: 500.0000 mg | ORAL_TABLET | Freq: Two times a day (BID) | ORAL | Status: AC

## 2012-01-08 NOTE — ED Provider Notes (Signed)
History     CSN: [id number]  Arrival date & time 01/08/12  4098   First MD Initiated Contact with Patient 01/08/12 [id number]      Chief Complaint  Patient presents with  . Emesis    (Consider location/radiation/quality/duration/timing/severity/associated sxs/prior treatment) HPI Comments: 78 year old female with a history of multiple abdominal surgeries including ruptured appendicitis, ruptured diverticula, diverting colostomy status post takedown and reanastomosis.  She states that over the last week she has had some upper respiratory symptoms with nasal stuffiness and a head cold, then last night developed nausea and vomiting and abdominal pain. She does have a history of small bowel obstructions in the past. Her symptoms are intermittent, they wax and wane and at this time she is having minimal symptoms. Nothing makes them better or worse, she cannot refer her last bowel movement, denies diarrhea or fevers. She has had no medication prior to arrival.  Patient is a 78 y.o. female presenting with vomiting. The history is provided by the patient.  Emesis     Past Medical History  Diagnosis Date  . Hx of colonic polyp   . COPD (chronic obstructive pulmonary disease)   . Diverticulosis of colon   . GERD (gastroesophageal reflux disease)     Dr. Kinnie Scales  . Hyperlipemia   . Hypertension   . PVD (peripheral vascular disease)     Dr. Lynnea Ferrier  . SBO (small bowel obstruction)      due to adhesions  . Mesenteric mass     2 cm  . Anxiety   . Atrial fibrillation 2011  . Unspecified peritonitis   . Stricture and stenosis of esophagus   . Aortic stenosis   . Heart murmur, systolic   . Unspecified vitamin D deficiency   . Peripheral vascular disease, unspecified   . Hx of adenomatous colonic polyps   . Small bowel obstruction     Hx of     Past Surgical History  Procedure Date  . Cholecystectomy   . Appendectomy   . Sigmoidectomy     Segemental with colostomy and Hartmann  .  Omentectomy     Partial with gastrostomy  . Colostomy closure     with end-to-end coloproctostomy anstomosis  . Abdominal adhesion surgery     Family History  Problem Relation Age of Onset  . Coronary artery disease Father   . Hypertension Other   . Colon cancer Neg Hx     History  Substance Use Topics  . Smoking status: Former Games developer  . Smokeless tobacco: Never Used  . Alcohol Use: No    OB History    Grav Para Term Preterm Abortions TAB SAB Ect Mult Living                  Review of Systems  Gastrointestinal: Positive for vomiting.  All other systems reviewed and are negative.    Allergies  Benicar; Ezetimibe-simvastatin; Hydrochlorothiazide; Lisinopril; and Promethazine hcl  Home Medications   Current Outpatient Rx  Name Route Sig Dispense Refill  . ACETAMINOPHEN 500 MG PO TABS Oral Take 500 mg by mouth every 6 (six) hours as needed. For pain    . ASPIRIN 81 MG PO TBEC Oral Take 81 mg by mouth daily.      Marland Kitchen VITAMIN D3 1000 UNITS PO TABS Oral Take 1,000 Units by mouth 2 (two) times daily.      Marland Kitchen DILTIAZEM HCL ER COATED BEADS 120 MG PO CP24 Oral Take 1 capsule (120 mg total) by  mouth 2 (two) times daily. 60 capsule 11  . METOPROLOL SUCCINATE ER 50 MG PO TB24 Oral Take 25 mg by mouth daily.    Marland Kitchen PANTOPRAZOLE SODIUM 40 MG PO TBEC  TAKE ONE (1) TABLET(S) EVERY MORNING FOR INDIGESTION 90 tablet 1  . ROSUVASTATIN CALCIUM 10 MG PO TABS Oral Take 10 mg by mouth daily. Take one one day. Skip two days and then repeat over and over.     . TRIAMTERENE-HCTZ 37.5-25 MG PO CAPS  TAKE ONE (1) CAPSULE(S) BY MOUTH ONCE A DAY 90 capsule 1  . WARFARIN SODIUM 5 MG PO TABS Oral Take 5 mg by mouth See admin instructions. Tuesday, Thursday, and Saturday.    . WARFARIN SODIUM 5 MG PO TABS Oral Take 2.5 mg by mouth See admin instructions. Monday, Wednesday, and Friday.    Marland Kitchen NAPROXEN 500 MG PO TABS Oral Take 1 tablet (500 mg total) by mouth 2 (two) times daily with a meal. 30 tablet 0  .  NITROGLYCERIN 0.4 MG SL SUBL Sublingual Place 0.4 mg under the tongue every 5 (five) minutes as needed. Total of 3 doses in 15 minutes.     Marland Kitchen ONDANSETRON 4 MG PO TBDP Oral Take 1 tablet (4 mg total) by mouth every 8 (eight) hours as needed for nausea. 10 tablet 0    BP 177/75  Pulse 64  Temp 98.3 F (36.8 C) (Oral)  Resp 17  SpO2 100%  Physical Exam  Nursing note and vitals reviewed. Constitutional: She appears well-developed and well-nourished. No distress.  HENT:  Head: Normocephalic and atraumatic.  Mouth/Throat: Oropharynx is clear and moist. No oropharyngeal exudate.  Eyes: Conjunctivae and EOM are normal. Pupils are equal, round, and reactive to light. Right eye exhibits no discharge. Left eye exhibits no discharge. No scleral icterus.  Neck: Normal range of motion. Neck supple. No JVD present. No thyromegaly present.  Cardiovascular: Normal rate, normal heart sounds and intact distal pulses.  Exam reveals no gallop and no friction rub.   No murmur heard.      irreg irreg rhythm, strong pules at radial arteries bil  Pulmonary/Chest: Effort normal and breath sounds normal. No respiratory distress. She has no wheezes. She has no rales.  Abdominal: Soft. She exhibits no distension and no mass. There is no tenderness.       Quiet abdomen, nontender  Musculoskeletal: Normal range of motion. She exhibits no edema and no tenderness.  Lymphadenopathy:    She has no cervical adenopathy.  Neurological: She is alert. Coordination normal.  Skin: Skin is warm and dry. No rash noted. No erythema.  Psychiatric: She has a normal mood and affect. Her behavior is normal.    ED Course  Procedures (including critical care time)  Labs Reviewed  DIFFERENTIAL - Abnormal; Notable for the following:    Neutrophils Relative 85 (*)     Neutro Abs 8.4 (*)     Lymphocytes Relative 9 (*)     All other components within normal limits  PROTIME-INR - Abnormal; Notable for the following:     Prothrombin Time 30.3 (*)     INR 2.84 (*)     All other components within normal limits  POCT I-STAT, CHEM 8 - Abnormal; Notable for the following:    Chloride 94 (*)     Glucose, Bld 149 (*)     Hemoglobin 15.3 (*)     All other components within normal limits  URINALYSIS, ROUTINE W REFLEX MICROSCOPIC - Abnormal; Notable for  the following:    Protein, ur 100 (*)     All other components within normal limits  CBC  LIPASE, BLOOD  HEPATIC FUNCTION PANEL  POCT I-STAT TROPONIN I  URINE MICROSCOPIC-ADD ON   Dg Abd Acute W/chest  01/08/2012  *RADIOLOGY REPORT*  Clinical Data: Abdominal pain, nausea and vomiting; abdominal distension.  History of bowel obstruction.  ACUTE ABDOMEN SERIES (ABDOMEN 2 VIEW & CHEST 1 VIEW)  Comparison: Abdominal radiograph performed 06/10/2010, and chest radiograph performed 09/12/2010  Findings: The lungs are well-aerated.  There is mild elevation of the left hemidiaphragm, with mild associated atelectasis.  Mild peribronchial thickening is noted.  There is no evidence of pleural effusion or pneumothorax.  The cardiomediastinal silhouette is borderline normal in size.  The visualized bowel gas pattern is nonspecific.  Scattered air and fluid-filled loops of small and large bowel are seen.  There is no evidence of significant bowel dilatation to suggest obstruction. No free intra-abdominal air is identified on the provided upright view.  Scattered clips are noted about the abdomen and pelvis.  No acute osseous abnormalities are seen; the sacroiliac joints are unremarkable in appearance.  Mild degenerative change is noted at the lower lumbar spine.  IMPRESSION:  1.  Nonspecific bowel gas pattern; no free intra-abdominal air seen. 2.  Mild left basilar atelectasis; mild peribronchial thickening.  Original Report Authenticated By: Tonia Ghent, M.D.     1. Abdominal  pain, other specified site   2. Nausea and vomiting       MDM  The patient is in atrial fibrillation,  she has a history of the same, he is anticoagulated with Coumadin, question intestinal blockage or ileus. Laboratory data requested, fluids, medications when necessary. The patient appears well at this time and has no complaints  ED ECG REPORT   Date: 01/08/2012 I personally interpreted the EKG  Rate: 65  Rhythm: atrial fibrillation  QRS Axis: left  Intervals: Irregular PR interval, QRS conduction delay  ST/T Wave abnormalities: nonspecific T wave changes  Conduction Disutrbances:nonspecific intraventricular conduction delay  Narrative Interpretation:   Old EKG Reviewed: Compared with 06/10/2010, no significant changes.   6:30 AM, patient reexamined, soft and nontender abdomen, labs reviewed showing normal lipase, normal liver function, normal blood counts without a leukocytosis and normal electrolytes. Her acute abdominal series and troponin were also normal.  I have personally interpreted her x-ray and find her to be no signs of bowel obstruction, no air fluid levels, no significant distention, no free air in the abdomen and lungs there are overall very clear without infiltrates.  Urinalysis pending  UA neg, pt well appearing, d/c home with f/u, return instructions given and undersetanding expresed.  Discharge Prescriptions include:  zofran Naprosyn   Vida Roller, MD 01/08/12 [phone]

## 2012-01-08 NOTE — ED Notes (Signed)
MD at bedside. Dr. Miller at bedside.  

## 2012-01-08 NOTE — ED Notes (Signed)
Pt given discharge paperwork/explained, son in law picking pt up, escorted to discharge window.

## 2012-01-08 NOTE — Discharge Instructions (Signed)
You have been diagnosed with undifferentiated abdominal pain.  Abdominal pain can be caused by many things. Your caregiver evaluates the seriousness of your pain by an examination and possibly blood or urine tests and imaging (CT scan, x-rays, ultrasound). Many cases can be observed and treated at home after initial evaluation in the emergency department. Even though you are being discharged home, abdominal pain can be unpredictable. Therefore, you need a repeat exam if your pain does not resolve, returns, or worsens. Most patient's with abdominal pain do not need to be admitted to the hospital or have surgery, but serious problems like appendicitis and gallbladder attacks can start out as nonspecific pain. Many abdominal conditions cannot be diagnosed in 1 visit, so followup evaluations are very important.  Seek immediate medical attention if:  *The pain does not go away or becomes severe. *Temperature above 101 develops *Repeated vomiting occurs(multiple episodes) *The pain becomes localized to portions of the abdomen. The right side could possibly be appendicitis. In an adult, the left lower portion of the abdomen could be colitis or diverticulitis. *Blood is being passed in stools or vomit *Return also if you develop chest pain, difficulty breathing, dizziness or fainting, or become confused poorly responsive or inconsolable (young children).     If you do not have a physician, you should reference the below phone numbers and call in the morning to establish follow up care.  RESOURCE GUIDE  Dental Problems  Patients with Medicaid: [facility]                     [facility] [address]                                           [address] Phone:  [phone]                                                  Phone:  [phone]  If unable to pay or uninsured, contact:  [facility] or [facility] to become qualified for the adult dental  clinic.  Chronic Pain Problems Contact [facility]  [phone] Patients need to be referred by their primary care doctor.  Insufficient Money for Medicine Contact [employer]:  call "211" or [facility] 271-5999.  No Primary Care Doctor Call [facility]  832-8000 Other agencies that provide inexpensive medical care    [facility]  [phone]    [facility]  [phone]    [facility]  [phone]    Women[facility] Clinic  [phone]    [facility]  [phone]    [facility]  [phone]  Psychological Services [facility]  [phone] [facility]  [phone] [facility]   800 853-5163 (emergency services [phone])  Substance Abuse Resources [facility]  336-882-2125 [facility] 336-784-9470 [facility] 336-285-9073 [facility] 336-845-3988 Residential & Outpatient Substance Abuse Program  800-659-3381  Abuse/Neglect [place] Child Abuse Hotline (336) 641-3795 [place] Child Abuse Hotline 800-378-5315 (After Hours)  Emergency Shelter [facility] (336) 271-5985  M[facility] (336) 275-9566 [facility]   Services (704) 372-4663  MRSA Hotline #:   832-7006    [place] Resources  [facility] [place]     [employer]                          [facility] [address] [place]                       [address]      [address]  [place]                                                [place]                            [place] Phone:  [phone]                                   Phone:  [phone]                 Phone:  [phone]  [facility] Phone:  [phone]  [place] Child Abuse Hotline (336) 342-1394 (336) 342-3537 (After Hours)    

## 2012-01-08 NOTE — ED Notes (Signed)
Pt states she is not nauseous now. Denies need for nausea meds at this time.

## 2012-01-08 NOTE — ED Notes (Signed)
Patient transported to X-ray 

## 2012-01-08 NOTE — ED Notes (Signed)
Pt states she had abdominal pain last night and now is having vomiting. Pt states she has a history of intestinal blockage. Pt reports that she has had a cold since last week.

## 2012-01-11 ENCOUNTER — Other Ambulatory Visit: Payer: Self-pay | Admitting: Internal Medicine

## 2012-01-28 ENCOUNTER — Other Ambulatory Visit: Payer: Self-pay | Admitting: Internal Medicine

## 2012-02-03 ENCOUNTER — Encounter: Payer: Self-pay | Admitting: Internal Medicine

## 2012-02-03 ENCOUNTER — Ambulatory Visit (INDEPENDENT_AMBULATORY_CARE_PROVIDER_SITE_OTHER): Payer: Medicare Other | Admitting: Internal Medicine

## 2012-02-03 VITALS — BP 130/60 | HR 80 | Temp 98.2°F | Resp 16 | Wt 134.0 lb

## 2012-02-03 DIAGNOSIS — I2510 Atherosclerotic heart disease of native coronary artery without angina pectoris: Secondary | ICD-10-CM

## 2012-02-03 DIAGNOSIS — Z0000 Encounter for general adult medical examination without abnormal findings: Secondary | ICD-10-CM | POA: Insufficient documentation

## 2012-02-03 DIAGNOSIS — I10 Essential (primary) hypertension: Secondary | ICD-10-CM

## 2012-02-03 DIAGNOSIS — R634 Abnormal weight loss: Secondary | ICD-10-CM

## 2012-02-03 DIAGNOSIS — E785 Hyperlipidemia, unspecified: Secondary | ICD-10-CM

## 2012-02-03 NOTE — Assessment & Plan Note (Signed)
Continue with current prescription therapy as reflected on the Med list.  

## 2012-02-03 NOTE — Progress Notes (Signed)
   Subjective:    Patient ID: [name] Public, female    DOB: 1919/03/11, 78 y.o.   MRN: [id number]  HPI  The patient is here for a wellness exam. The patient has been doing well overall without major physical or psychological issues going on lately. [place]...  The patient presents for a follow-up of  chronic hypertension, chronic dyslipidemia, SBO, PVD controlled with medicines  F/u wt loss - watching  BP Readings from Last 3 Encounters:  02/03/12 130/60  01/08/12 139/89  11/25/11 130/90   Wt Readings from Last 3 Encounters:  02/03/12 134 lb (60.782 kg)  11/25/11 135 lb (61.236 kg)  08/25/11 140 lb (63.504 kg)       Review of Systems  Constitutional: Positive for fatigue. Negative for chills, activity change, appetite change and unexpected weight change.  HENT: Negative for congestion, mouth sores and sinus pressure.   Eyes: Negative for visual disturbance.  Respiratory: Negative for cough and chest tightness.   Gastrointestinal: Negative for nausea and abdominal pain.  Genitourinary: Negative for frequency, difficulty urinating and vaginal pain.  Musculoskeletal: Negative for back pain and gait problem.  Skin: Negative for pallor and rash.  Neurological: Negative for dizziness, tremors, weakness, numbness and headaches.  Psychiatric/Behavioral: Negative for suicidal ideas, confusion and disturbed wake/sleep cycle.       Objective:   Physical Exam  Constitutional: She appears well-developed. No distress.  HENT:  Head: Normocephalic.  Right Ear: External ear normal.  Left Ear: External ear normal.  Nose: Nose normal.  Mouth/Throat: Oropharynx is clear and moist.  Eyes: Conjunctivae are normal. Pupils are equal, round, and reactive to light. Right eye exhibits no discharge. Left eye exhibits no discharge.  Neck: Normal range of motion. Neck supple. No JVD present. No tracheal deviation present. No thyromegaly present.    Cardiovascular: Normal rate and normal heart sounds.        irreg irreg  Pulmonary/Chest: No stridor. No respiratory distress. She has no wheezes.  Abdominal: Soft. Bowel sounds are normal. She exhibits no distension and no mass. There is no tenderness. There is no rebound and no guarding.  Musculoskeletal: She exhibits no edema and no tenderness.  Lymphadenopathy:    She has no cervical adenopathy.  Neurological: She displays normal reflexes. No cranial nerve deficit. She exhibits normal muscle tone. Coordination normal.  Skin: No rash noted. No erythema.  Psychiatric: She has a normal mood and affect. Her behavior is normal. Judgment and thought content normal.    Lab Results  Component Value Date   WBC 9.8 01/08/2012   HGB 15.3* 01/08/2012   HCT 45.0 01/08/2012   PLT 336 01/08/2012   GLUCOSE 149* 01/08/2012   CHOL 203* 04/16/2011   TRIG 197.0* 04/16/2011   HDL 50.00 04/16/2011   LDLDIRECT 123.0 04/16/2011   LDLCALC 103* 08/25/2010   ALT 10 01/08/2012   AST 13 01/08/2012   NA 138 01/08/2012   K 3.9 01/08/2012   CL 94* 01/08/2012   CREATININE 1.00 01/08/2012   BUN 11 01/08/2012   CO2 31 08/24/2011   TSH 0.94 04/16/2011   INR 2.84* 01/08/2012   HGBA1C 7.1* 05/27/2010         Assessment & Plan:

## 2012-02-03 NOTE — Assessment & Plan Note (Signed)
Wt Readings from Last 3 Encounters:  02/03/12 134 lb (60.782 kg)  11/25/11 135 lb (61.236 kg)  08/25/11 140 lb (63.504 kg)

## 2012-02-03 NOTE — Assessment & Plan Note (Signed)

## 2012-03-08 ENCOUNTER — Encounter: Payer: Medicare Other | Admitting: Internal Medicine

## 2013-07-21 ENCOUNTER — Other Ambulatory Visit: Payer: Self-pay | Admitting: Internal Medicine

## 2014-07-20 HISTORY — PX: CATARACT EXTRACTION, BILATERAL: SHX1313

## 2015-07-23 DIAGNOSIS — F329 Major depressive disorder, single episode, unspecified: Secondary | ICD-10-CM | POA: Diagnosis not present

## 2015-07-23 DIAGNOSIS — I482 Chronic atrial fibrillation: Secondary | ICD-10-CM | POA: Diagnosis not present

## 2015-07-23 DIAGNOSIS — I350 Nonrheumatic aortic (valve) stenosis: Secondary | ICD-10-CM | POA: Diagnosis not present

## 2015-07-23 DIAGNOSIS — K649 Unspecified hemorrhoids: Secondary | ICD-10-CM | POA: Diagnosis not present

## 2015-07-23 DIAGNOSIS — E7800 Pure hypercholesterolemia, unspecified: Secondary | ICD-10-CM | POA: Diagnosis not present

## 2015-07-23 DIAGNOSIS — Z1211 Encounter for screening for malignant neoplasm of colon: Secondary | ICD-10-CM | POA: Diagnosis not present

## 2015-07-23 DIAGNOSIS — E559 Vitamin D deficiency, unspecified: Secondary | ICD-10-CM | POA: Diagnosis not present

## 2015-07-23 DIAGNOSIS — K5909 Other constipation: Secondary | ICD-10-CM | POA: Diagnosis not present

## 2015-07-23 DIAGNOSIS — I10 Essential (primary) hypertension: Secondary | ICD-10-CM | POA: Diagnosis not present

## 2015-07-23 DIAGNOSIS — Z7901 Long term (current) use of anticoagulants: Secondary | ICD-10-CM | POA: Diagnosis not present

## 2015-08-27 DIAGNOSIS — I350 Nonrheumatic aortic (valve) stenosis: Secondary | ICD-10-CM | POA: Diagnosis not present

## 2015-08-27 DIAGNOSIS — I10 Essential (primary) hypertension: Secondary | ICD-10-CM | POA: Diagnosis not present

## 2015-08-27 DIAGNOSIS — E7800 Pure hypercholesterolemia, unspecified: Secondary | ICD-10-CM | POA: Diagnosis not present

## 2015-08-27 DIAGNOSIS — I482 Chronic atrial fibrillation: Secondary | ICD-10-CM | POA: Diagnosis not present

## 2015-08-27 DIAGNOSIS — K5909 Other constipation: Secondary | ICD-10-CM | POA: Diagnosis not present

## 2015-08-27 DIAGNOSIS — F329 Major depressive disorder, single episode, unspecified: Secondary | ICD-10-CM | POA: Diagnosis not present

## 2015-09-11 DIAGNOSIS — I482 Chronic atrial fibrillation: Secondary | ICD-10-CM | POA: Diagnosis not present

## 2015-09-20 DIAGNOSIS — I350 Nonrheumatic aortic (valve) stenosis: Secondary | ICD-10-CM | POA: Diagnosis not present

## 2015-09-20 DIAGNOSIS — I482 Chronic atrial fibrillation: Secondary | ICD-10-CM | POA: Diagnosis not present

## 2015-09-20 DIAGNOSIS — I10 Essential (primary) hypertension: Secondary | ICD-10-CM | POA: Diagnosis not present

## 2015-10-04 DIAGNOSIS — I350 Nonrheumatic aortic (valve) stenosis: Secondary | ICD-10-CM | POA: Diagnosis not present

## 2015-10-04 DIAGNOSIS — I482 Chronic atrial fibrillation: Secondary | ICD-10-CM | POA: Diagnosis not present

## 2015-10-04 DIAGNOSIS — I10 Essential (primary) hypertension: Secondary | ICD-10-CM | POA: Diagnosis not present

## 2015-10-09 DIAGNOSIS — L820 Inflamed seborrheic keratosis: Secondary | ICD-10-CM | POA: Diagnosis not present

## 2015-10-09 DIAGNOSIS — L708 Other acne: Secondary | ICD-10-CM | POA: Diagnosis not present

## 2015-10-09 DIAGNOSIS — Z85828 Personal history of other malignant neoplasm of skin: Secondary | ICD-10-CM | POA: Diagnosis not present

## 2015-10-09 DIAGNOSIS — L570 Actinic keratosis: Secondary | ICD-10-CM | POA: Diagnosis not present

## 2015-10-09 DIAGNOSIS — D229 Melanocytic nevi, unspecified: Secondary | ICD-10-CM | POA: Diagnosis not present

## 2015-11-07 DIAGNOSIS — J40 Bronchitis, not specified as acute or chronic: Secondary | ICD-10-CM | POA: Diagnosis not present

## 2015-11-11 DIAGNOSIS — R062 Wheezing: Secondary | ICD-10-CM | POA: Diagnosis not present

## 2015-11-11 DIAGNOSIS — J40 Bronchitis, not specified as acute or chronic: Secondary | ICD-10-CM | POA: Diagnosis not present

## 2015-11-25 DIAGNOSIS — E7800 Pure hypercholesterolemia, unspecified: Secondary | ICD-10-CM | POA: Diagnosis not present

## 2015-11-25 DIAGNOSIS — Z79899 Other long term (current) drug therapy: Secondary | ICD-10-CM | POA: Diagnosis not present

## 2015-11-25 DIAGNOSIS — I10 Essential (primary) hypertension: Secondary | ICD-10-CM | POA: Diagnosis not present

## 2015-12-12 DIAGNOSIS — Z87891 Personal history of nicotine dependence: Secondary | ICD-10-CM | POA: Diagnosis not present

## 2015-12-12 DIAGNOSIS — R42 Dizziness and giddiness: Secondary | ICD-10-CM | POA: Diagnosis not present

## 2015-12-12 DIAGNOSIS — I10 Essential (primary) hypertension: Secondary | ICD-10-CM | POA: Diagnosis not present

## 2015-12-12 DIAGNOSIS — I6782 Cerebral ischemia: Secondary | ICD-10-CM | POA: Diagnosis not present

## 2015-12-12 DIAGNOSIS — R404 Transient alteration of awareness: Secondary | ICD-10-CM | POA: Diagnosis not present

## 2015-12-12 DIAGNOSIS — F419 Anxiety disorder, unspecified: Secondary | ICD-10-CM | POA: Diagnosis not present

## 2015-12-12 DIAGNOSIS — Z79899 Other long term (current) drug therapy: Secondary | ICD-10-CM | POA: Diagnosis not present

## 2015-12-12 DIAGNOSIS — E860 Dehydration: Secondary | ICD-10-CM | POA: Diagnosis not present

## 2015-12-12 DIAGNOSIS — I4891 Unspecified atrial fibrillation: Secondary | ICD-10-CM | POA: Diagnosis not present

## 2015-12-12 DIAGNOSIS — Z7901 Long term (current) use of anticoagulants: Secondary | ICD-10-CM | POA: Diagnosis not present

## 2015-12-12 DIAGNOSIS — R112 Nausea with vomiting, unspecified: Secondary | ICD-10-CM | POA: Diagnosis not present

## 2015-12-17 DIAGNOSIS — R42 Dizziness and giddiness: Secondary | ICD-10-CM | POA: Diagnosis not present

## 2015-12-24 DIAGNOSIS — I482 Chronic atrial fibrillation: Secondary | ICD-10-CM | POA: Diagnosis not present

## 2015-12-24 DIAGNOSIS — I350 Nonrheumatic aortic (valve) stenosis: Secondary | ICD-10-CM | POA: Diagnosis not present

## 2015-12-24 DIAGNOSIS — I10 Essential (primary) hypertension: Secondary | ICD-10-CM | POA: Diagnosis not present

## 2016-01-13 DIAGNOSIS — D3131 Benign neoplasm of right choroid: Secondary | ICD-10-CM | POA: Diagnosis not present

## 2016-01-13 DIAGNOSIS — H5212 Myopia, left eye: Secondary | ICD-10-CM | POA: Diagnosis not present

## 2016-01-13 DIAGNOSIS — H5201 Hypermetropia, right eye: Secondary | ICD-10-CM | POA: Diagnosis not present

## 2016-01-13 DIAGNOSIS — H26493 Other secondary cataract, bilateral: Secondary | ICD-10-CM | POA: Diagnosis not present

## 2016-01-27 DIAGNOSIS — H26492 Other secondary cataract, left eye: Secondary | ICD-10-CM | POA: Diagnosis not present

## 2016-01-27 DIAGNOSIS — H26493 Other secondary cataract, bilateral: Secondary | ICD-10-CM | POA: Diagnosis not present

## 2016-02-12 DIAGNOSIS — H26491 Other secondary cataract, right eye: Secondary | ICD-10-CM | POA: Diagnosis not present

## 2016-02-20 DIAGNOSIS — R1084 Generalized abdominal pain: Secondary | ICD-10-CM | POA: Diagnosis not present

## 2016-02-20 DIAGNOSIS — R8299 Other abnormal findings in urine: Secondary | ICD-10-CM | POA: Diagnosis not present

## 2016-02-20 DIAGNOSIS — K640 First degree hemorrhoids: Secondary | ICD-10-CM | POA: Diagnosis not present

## 2016-02-25 DIAGNOSIS — R1084 Generalized abdominal pain: Secondary | ICD-10-CM | POA: Diagnosis not present

## 2016-02-25 DIAGNOSIS — K5909 Other constipation: Secondary | ICD-10-CM | POA: Diagnosis not present

## 2016-02-25 DIAGNOSIS — I350 Nonrheumatic aortic (valve) stenosis: Secondary | ICD-10-CM | POA: Diagnosis not present

## 2016-02-25 DIAGNOSIS — I482 Chronic atrial fibrillation: Secondary | ICD-10-CM | POA: Diagnosis not present

## 2016-02-25 DIAGNOSIS — E7800 Pure hypercholesterolemia, unspecified: Secondary | ICD-10-CM | POA: Diagnosis not present

## 2016-02-25 DIAGNOSIS — I10 Essential (primary) hypertension: Secondary | ICD-10-CM | POA: Diagnosis not present

## 2016-02-26 DIAGNOSIS — R1084 Generalized abdominal pain: Secondary | ICD-10-CM | POA: Diagnosis not present

## 2016-03-06 DIAGNOSIS — H5201 Hypermetropia, right eye: Secondary | ICD-10-CM | POA: Diagnosis not present

## 2016-03-06 DIAGNOSIS — H5212 Myopia, left eye: Secondary | ICD-10-CM | POA: Diagnosis not present

## 2016-03-06 DIAGNOSIS — H59093 Other disorders of the eye following cataract surgery, bilateral: Secondary | ICD-10-CM | POA: Diagnosis not present

## 2016-03-06 DIAGNOSIS — D3131 Benign neoplasm of right choroid: Secondary | ICD-10-CM | POA: Diagnosis not present

## 2016-04-09 DIAGNOSIS — M7711 Lateral epicondylitis, right elbow: Secondary | ICD-10-CM | POA: Diagnosis not present

## 2016-04-09 DIAGNOSIS — Z23 Encounter for immunization: Secondary | ICD-10-CM | POA: Diagnosis not present

## 2016-05-11 DIAGNOSIS — M79601 Pain in right arm: Secondary | ICD-10-CM | POA: Diagnosis not present

## 2016-05-11 DIAGNOSIS — D518 Other vitamin B12 deficiency anemias: Secondary | ICD-10-CM | POA: Diagnosis not present

## 2016-05-11 DIAGNOSIS — M5030 Other cervical disc degeneration, unspecified cervical region: Secondary | ICD-10-CM | POA: Diagnosis not present

## 2016-05-11 DIAGNOSIS — R918 Other nonspecific abnormal finding of lung field: Secondary | ICD-10-CM | POA: Diagnosis not present

## 2016-05-11 DIAGNOSIS — Z7901 Long term (current) use of anticoagulants: Secondary | ICD-10-CM | POA: Diagnosis not present

## 2016-05-11 DIAGNOSIS — M542 Cervicalgia: Secondary | ICD-10-CM | POA: Diagnosis not present

## 2016-05-11 DIAGNOSIS — M25511 Pain in right shoulder: Secondary | ICD-10-CM | POA: Diagnosis not present

## 2016-05-11 DIAGNOSIS — Z79899 Other long term (current) drug therapy: Secondary | ICD-10-CM | POA: Diagnosis not present

## 2016-05-11 DIAGNOSIS — M79641 Pain in right hand: Secondary | ICD-10-CM | POA: Diagnosis not present

## 2016-05-11 DIAGNOSIS — R05 Cough: Secondary | ICD-10-CM | POA: Diagnosis not present

## 2016-05-11 DIAGNOSIS — G629 Polyneuropathy, unspecified: Secondary | ICD-10-CM | POA: Diagnosis not present

## 2016-05-11 DIAGNOSIS — M5412 Radiculopathy, cervical region: Secondary | ICD-10-CM | POA: Diagnosis not present

## 2016-05-18 DIAGNOSIS — M5412 Radiculopathy, cervical region: Secondary | ICD-10-CM | POA: Diagnosis not present

## 2016-05-18 DIAGNOSIS — M79641 Pain in right hand: Secondary | ICD-10-CM | POA: Diagnosis not present

## 2016-05-18 DIAGNOSIS — M79601 Pain in right arm: Secondary | ICD-10-CM | POA: Diagnosis not present

## 2016-05-18 DIAGNOSIS — M25511 Pain in right shoulder: Secondary | ICD-10-CM | POA: Diagnosis not present

## 2016-05-25 DIAGNOSIS — M25511 Pain in right shoulder: Secondary | ICD-10-CM | POA: Diagnosis not present

## 2016-05-25 DIAGNOSIS — M79601 Pain in right arm: Secondary | ICD-10-CM | POA: Diagnosis not present

## 2016-05-25 DIAGNOSIS — M79641 Pain in right hand: Secondary | ICD-10-CM | POA: Diagnosis not present

## 2016-05-25 DIAGNOSIS — M5412 Radiculopathy, cervical region: Secondary | ICD-10-CM | POA: Diagnosis not present

## 2016-05-27 DIAGNOSIS — Z0000 Encounter for general adult medical examination without abnormal findings: Secondary | ICD-10-CM | POA: Diagnosis not present

## 2016-05-27 DIAGNOSIS — Z79899 Other long term (current) drug therapy: Secondary | ICD-10-CM | POA: Diagnosis not present

## 2016-05-27 DIAGNOSIS — E7800 Pure hypercholesterolemia, unspecified: Secondary | ICD-10-CM | POA: Diagnosis not present

## 2016-05-27 DIAGNOSIS — I10 Essential (primary) hypertension: Secondary | ICD-10-CM | POA: Diagnosis not present

## 2016-06-01 DIAGNOSIS — M79601 Pain in right arm: Secondary | ICD-10-CM | POA: Diagnosis not present

## 2016-06-01 DIAGNOSIS — M79641 Pain in right hand: Secondary | ICD-10-CM | POA: Diagnosis not present

## 2016-06-01 DIAGNOSIS — M25511 Pain in right shoulder: Secondary | ICD-10-CM | POA: Diagnosis not present

## 2016-06-01 DIAGNOSIS — M5412 Radiculopathy, cervical region: Secondary | ICD-10-CM | POA: Diagnosis not present

## 2016-07-07 DIAGNOSIS — I350 Nonrheumatic aortic (valve) stenosis: Secondary | ICD-10-CM | POA: Diagnosis not present

## 2016-07-07 DIAGNOSIS — E7800 Pure hypercholesterolemia, unspecified: Secondary | ICD-10-CM | POA: Diagnosis not present

## 2016-07-07 DIAGNOSIS — I482 Chronic atrial fibrillation: Secondary | ICD-10-CM | POA: Diagnosis not present

## 2016-07-07 DIAGNOSIS — I10 Essential (primary) hypertension: Secondary | ICD-10-CM | POA: Diagnosis not present

## 2016-07-20 HISTORY — PX: CORONARY ANGIOPLASTY WITH STENT PLACEMENT: SHX49

## 2016-08-19 DIAGNOSIS — E7800 Pure hypercholesterolemia, unspecified: Secondary | ICD-10-CM | POA: Diagnosis not present

## 2016-08-19 DIAGNOSIS — I350 Nonrheumatic aortic (valve) stenosis: Secondary | ICD-10-CM | POA: Diagnosis not present

## 2016-08-19 DIAGNOSIS — I482 Chronic atrial fibrillation: Secondary | ICD-10-CM | POA: Diagnosis not present

## 2016-08-19 DIAGNOSIS — I10 Essential (primary) hypertension: Secondary | ICD-10-CM | POA: Diagnosis not present

## 2016-09-01 DIAGNOSIS — I350 Nonrheumatic aortic (valve) stenosis: Secondary | ICD-10-CM | POA: Diagnosis not present

## 2016-09-01 DIAGNOSIS — I10 Essential (primary) hypertension: Secondary | ICD-10-CM | POA: Diagnosis not present

## 2016-09-01 DIAGNOSIS — R011 Cardiac murmur, unspecified: Secondary | ICD-10-CM | POA: Diagnosis not present

## 2016-09-08 DIAGNOSIS — I350 Nonrheumatic aortic (valve) stenosis: Secondary | ICD-10-CM | POA: Diagnosis not present

## 2016-09-16 DIAGNOSIS — I2510 Atherosclerotic heart disease of native coronary artery without angina pectoris: Secondary | ICD-10-CM | POA: Diagnosis not present

## 2016-09-16 DIAGNOSIS — I70201 Unspecified atherosclerosis of native arteries of extremities, right leg: Secondary | ICD-10-CM | POA: Diagnosis not present

## 2016-09-16 DIAGNOSIS — I447 Left bundle-branch block, unspecified: Secondary | ICD-10-CM | POA: Diagnosis not present

## 2016-09-16 DIAGNOSIS — I482 Chronic atrial fibrillation: Secondary | ICD-10-CM | POA: Diagnosis not present

## 2016-09-16 DIAGNOSIS — I350 Nonrheumatic aortic (valve) stenosis: Secondary | ICD-10-CM | POA: Diagnosis not present

## 2016-09-16 DIAGNOSIS — F419 Anxiety disorder, unspecified: Secondary | ICD-10-CM | POA: Diagnosis not present

## 2016-09-16 DIAGNOSIS — Z7901 Long term (current) use of anticoagulants: Secondary | ICD-10-CM | POA: Diagnosis not present

## 2016-09-16 DIAGNOSIS — I10 Essential (primary) hypertension: Secondary | ICD-10-CM | POA: Diagnosis not present

## 2016-09-16 DIAGNOSIS — E785 Hyperlipidemia, unspecified: Secondary | ICD-10-CM | POA: Diagnosis not present

## 2016-09-16 DIAGNOSIS — I272 Pulmonary hypertension, unspecified: Secondary | ICD-10-CM | POA: Diagnosis not present

## 2016-10-01 DIAGNOSIS — I350 Nonrheumatic aortic (valve) stenosis: Secondary | ICD-10-CM | POA: Diagnosis not present

## 2016-10-01 DIAGNOSIS — I482 Chronic atrial fibrillation: Secondary | ICD-10-CM | POA: Diagnosis not present

## 2016-10-01 DIAGNOSIS — R7303 Prediabetes: Secondary | ICD-10-CM | POA: Diagnosis not present

## 2016-10-01 DIAGNOSIS — K219 Gastro-esophageal reflux disease without esophagitis: Secondary | ICD-10-CM | POA: Diagnosis not present

## 2016-10-01 DIAGNOSIS — I25118 Atherosclerotic heart disease of native coronary artery with other forms of angina pectoris: Secondary | ICD-10-CM | POA: Diagnosis not present

## 2016-10-01 DIAGNOSIS — E7800 Pure hypercholesterolemia, unspecified: Secondary | ICD-10-CM | POA: Diagnosis not present

## 2016-10-01 DIAGNOSIS — I10 Essential (primary) hypertension: Secondary | ICD-10-CM | POA: Diagnosis not present

## 2016-10-01 DIAGNOSIS — R0602 Shortness of breath: Secondary | ICD-10-CM | POA: Diagnosis not present

## 2016-10-01 DIAGNOSIS — I739 Peripheral vascular disease, unspecified: Secondary | ICD-10-CM | POA: Diagnosis not present

## 2016-10-01 DIAGNOSIS — I5032 Chronic diastolic (congestive) heart failure: Secondary | ICD-10-CM | POA: Diagnosis not present

## 2016-10-06 DIAGNOSIS — N281 Cyst of kidney, acquired: Secondary | ICD-10-CM | POA: Diagnosis not present

## 2016-10-06 DIAGNOSIS — R0602 Shortness of breath: Secondary | ICD-10-CM | POA: Diagnosis not present

## 2016-10-06 DIAGNOSIS — E079 Disorder of thyroid, unspecified: Secondary | ICD-10-CM | POA: Diagnosis not present

## 2016-10-06 DIAGNOSIS — E279 Disorder of adrenal gland, unspecified: Secondary | ICD-10-CM | POA: Diagnosis not present

## 2016-10-06 DIAGNOSIS — Z01810 Encounter for preprocedural cardiovascular examination: Secondary | ICD-10-CM | POA: Diagnosis not present

## 2016-10-06 DIAGNOSIS — I350 Nonrheumatic aortic (valve) stenosis: Secondary | ICD-10-CM | POA: Diagnosis not present

## 2016-10-09 DIAGNOSIS — I5032 Chronic diastolic (congestive) heart failure: Secondary | ICD-10-CM | POA: Diagnosis not present

## 2016-10-09 DIAGNOSIS — F419 Anxiety disorder, unspecified: Secondary | ICD-10-CM | POA: Diagnosis not present

## 2016-10-09 DIAGNOSIS — I110 Hypertensive heart disease with heart failure: Secondary | ICD-10-CM | POA: Diagnosis not present

## 2016-10-09 DIAGNOSIS — E785 Hyperlipidemia, unspecified: Secondary | ICD-10-CM | POA: Diagnosis not present

## 2016-10-09 DIAGNOSIS — I350 Nonrheumatic aortic (valve) stenosis: Secondary | ICD-10-CM | POA: Diagnosis not present

## 2016-10-09 DIAGNOSIS — Z87891 Personal history of nicotine dependence: Secondary | ICD-10-CM | POA: Diagnosis not present

## 2016-10-09 DIAGNOSIS — Z7901 Long term (current) use of anticoagulants: Secondary | ICD-10-CM | POA: Diagnosis not present

## 2016-10-09 DIAGNOSIS — I25118 Atherosclerotic heart disease of native coronary artery with other forms of angina pectoris: Secondary | ICD-10-CM | POA: Diagnosis not present

## 2016-10-09 DIAGNOSIS — R7303 Prediabetes: Secondary | ICD-10-CM | POA: Diagnosis not present

## 2016-10-09 DIAGNOSIS — K219 Gastro-esophageal reflux disease without esophagitis: Secondary | ICD-10-CM | POA: Diagnosis not present

## 2016-10-09 DIAGNOSIS — I482 Chronic atrial fibrillation: Secondary | ICD-10-CM | POA: Diagnosis not present

## 2016-10-09 DIAGNOSIS — I2510 Atherosclerotic heart disease of native coronary artery without angina pectoris: Secondary | ICD-10-CM | POA: Diagnosis not present

## 2016-10-09 DIAGNOSIS — I739 Peripheral vascular disease, unspecified: Secondary | ICD-10-CM | POA: Diagnosis not present

## 2016-10-10 DIAGNOSIS — I110 Hypertensive heart disease with heart failure: Secondary | ICD-10-CM | POA: Diagnosis not present

## 2016-10-10 DIAGNOSIS — I25118 Atherosclerotic heart disease of native coronary artery with other forms of angina pectoris: Secondary | ICD-10-CM | POA: Diagnosis not present

## 2016-10-10 DIAGNOSIS — E785 Hyperlipidemia, unspecified: Secondary | ICD-10-CM | POA: Diagnosis not present

## 2016-10-10 DIAGNOSIS — I482 Chronic atrial fibrillation: Secondary | ICD-10-CM | POA: Diagnosis not present

## 2016-10-10 DIAGNOSIS — I350 Nonrheumatic aortic (valve) stenosis: Secondary | ICD-10-CM | POA: Diagnosis not present

## 2016-10-10 DIAGNOSIS — I10 Essential (primary) hypertension: Secondary | ICD-10-CM | POA: Diagnosis not present

## 2016-10-10 DIAGNOSIS — I739 Peripheral vascular disease, unspecified: Secondary | ICD-10-CM | POA: Diagnosis not present

## 2016-10-10 DIAGNOSIS — I5032 Chronic diastolic (congestive) heart failure: Secondary | ICD-10-CM | POA: Diagnosis not present

## 2016-10-15 DIAGNOSIS — R0602 Shortness of breath: Secondary | ICD-10-CM | POA: Diagnosis not present

## 2016-10-15 DIAGNOSIS — E785 Hyperlipidemia, unspecified: Secondary | ICD-10-CM | POA: Diagnosis not present

## 2016-10-15 DIAGNOSIS — I10 Essential (primary) hypertension: Secondary | ICD-10-CM | POA: Diagnosis not present

## 2016-10-15 DIAGNOSIS — I350 Nonrheumatic aortic (valve) stenosis: Secondary | ICD-10-CM | POA: Diagnosis not present

## 2016-11-09 DIAGNOSIS — R7303 Prediabetes: Secondary | ICD-10-CM | POA: Diagnosis not present

## 2016-11-09 DIAGNOSIS — R0609 Other forms of dyspnea: Secondary | ICD-10-CM | POA: Diagnosis not present

## 2016-11-09 DIAGNOSIS — Z01810 Encounter for preprocedural cardiovascular examination: Secondary | ICD-10-CM | POA: Diagnosis not present

## 2016-11-09 DIAGNOSIS — I10 Essential (primary) hypertension: Secondary | ICD-10-CM | POA: Diagnosis not present

## 2016-11-09 DIAGNOSIS — Z79899 Other long term (current) drug therapy: Secondary | ICD-10-CM | POA: Diagnosis not present

## 2016-11-09 DIAGNOSIS — Z7901 Long term (current) use of anticoagulants: Secondary | ICD-10-CM | POA: Diagnosis not present

## 2016-11-09 DIAGNOSIS — E785 Hyperlipidemia, unspecified: Secondary | ICD-10-CM | POA: Diagnosis not present

## 2016-11-09 DIAGNOSIS — Z7902 Long term (current) use of antithrombotics/antiplatelets: Secondary | ICD-10-CM | POA: Diagnosis not present

## 2016-11-09 DIAGNOSIS — M1990 Unspecified osteoarthritis, unspecified site: Secondary | ICD-10-CM | POA: Diagnosis not present

## 2016-11-09 DIAGNOSIS — Z87891 Personal history of nicotine dependence: Secondary | ICD-10-CM | POA: Diagnosis not present

## 2016-11-09 DIAGNOSIS — F419 Anxiety disorder, unspecified: Secondary | ICD-10-CM | POA: Diagnosis not present

## 2016-11-09 DIAGNOSIS — Z01818 Encounter for other preprocedural examination: Secondary | ICD-10-CM | POA: Diagnosis not present

## 2016-11-09 DIAGNOSIS — I350 Nonrheumatic aortic (valve) stenosis: Secondary | ICD-10-CM | POA: Diagnosis not present

## 2016-11-16 DIAGNOSIS — Z955 Presence of coronary angioplasty implant and graft: Secondary | ICD-10-CM | POA: Diagnosis not present

## 2016-11-16 DIAGNOSIS — E785 Hyperlipidemia, unspecified: Secondary | ICD-10-CM | POA: Diagnosis present

## 2016-11-16 DIAGNOSIS — J9811 Atelectasis: Secondary | ICD-10-CM | POA: Diagnosis not present

## 2016-11-16 DIAGNOSIS — I771 Stricture of artery: Secondary | ICD-10-CM | POA: Diagnosis not present

## 2016-11-16 DIAGNOSIS — I350 Nonrheumatic aortic (valve) stenosis: Secondary | ICD-10-CM | POA: Diagnosis present

## 2016-11-16 DIAGNOSIS — I110 Hypertensive heart disease with heart failure: Secondary | ICD-10-CM | POA: Diagnosis present

## 2016-11-16 DIAGNOSIS — E041 Nontoxic single thyroid nodule: Secondary | ICD-10-CM | POA: Diagnosis not present

## 2016-11-16 DIAGNOSIS — D62 Acute posthemorrhagic anemia: Secondary | ICD-10-CM | POA: Diagnosis not present

## 2016-11-16 DIAGNOSIS — I351 Nonrheumatic aortic (valve) insufficiency: Secondary | ICD-10-CM | POA: Diagnosis not present

## 2016-11-16 DIAGNOSIS — K219 Gastro-esophageal reflux disease without esophagitis: Secondary | ICD-10-CM | POA: Diagnosis present

## 2016-11-16 DIAGNOSIS — Z4889 Encounter for other specified surgical aftercare: Secondary | ICD-10-CM | POA: Diagnosis not present

## 2016-11-16 DIAGNOSIS — I639 Cerebral infarction, unspecified: Secondary | ICD-10-CM | POA: Diagnosis not present

## 2016-11-16 DIAGNOSIS — J90 Pleural effusion, not elsewhere classified: Secondary | ICD-10-CM | POA: Diagnosis not present

## 2016-11-16 DIAGNOSIS — I442 Atrioventricular block, complete: Secondary | ICD-10-CM | POA: Diagnosis not present

## 2016-11-16 DIAGNOSIS — Z87891 Personal history of nicotine dependence: Secondary | ICD-10-CM | POA: Diagnosis not present

## 2016-11-16 DIAGNOSIS — Z48812 Encounter for surgical aftercare following surgery on the circulatory system: Secondary | ICD-10-CM | POA: Diagnosis not present

## 2016-11-16 DIAGNOSIS — Z8673 Personal history of transient ischemic attack (TIA), and cerebral infarction without residual deficits: Secondary | ICD-10-CM | POA: Diagnosis not present

## 2016-11-16 DIAGNOSIS — Z7902 Long term (current) use of antithrombotics/antiplatelets: Secondary | ICD-10-CM | POA: Diagnosis not present

## 2016-11-16 DIAGNOSIS — I481 Persistent atrial fibrillation: Secondary | ICD-10-CM | POA: Diagnosis present

## 2016-11-16 DIAGNOSIS — I5032 Chronic diastolic (congestive) heart failure: Secondary | ICD-10-CM | POA: Diagnosis present

## 2016-11-16 DIAGNOSIS — Z7901 Long term (current) use of anticoagulants: Secondary | ICD-10-CM | POA: Diagnosis not present

## 2016-11-16 DIAGNOSIS — R4701 Aphasia: Secondary | ICD-10-CM | POA: Diagnosis present

## 2016-11-16 DIAGNOSIS — R7303 Prediabetes: Secondary | ICD-10-CM | POA: Diagnosis present

## 2016-11-16 DIAGNOSIS — J811 Chronic pulmonary edema: Secondary | ICD-10-CM | POA: Diagnosis not present

## 2016-11-16 DIAGNOSIS — R918 Other nonspecific abnormal finding of lung field: Secondary | ICD-10-CM | POA: Diagnosis not present

## 2016-11-16 DIAGNOSIS — I2510 Atherosclerotic heart disease of native coronary artery without angina pectoris: Secondary | ICD-10-CM | POA: Diagnosis present

## 2016-11-16 DIAGNOSIS — I482 Chronic atrial fibrillation: Secondary | ICD-10-CM | POA: Diagnosis not present

## 2016-11-16 DIAGNOSIS — G8194 Hemiplegia, unspecified affecting left nondominant side: Secondary | ICD-10-CM | POA: Diagnosis not present

## 2016-11-16 DIAGNOSIS — Z006 Encounter for examination for normal comparison and control in clinical research program: Secondary | ICD-10-CM | POA: Diagnosis not present

## 2016-11-17 HISTORY — PX: PACEMAKER IMPLANT: EP1218

## 2016-11-19 DIAGNOSIS — Z952 Presence of prosthetic heart valve: Secondary | ICD-10-CM | POA: Diagnosis not present

## 2016-11-19 DIAGNOSIS — K649 Unspecified hemorrhoids: Secondary | ICD-10-CM | POA: Diagnosis present

## 2016-11-19 DIAGNOSIS — Z950 Presence of cardiac pacemaker: Secondary | ICD-10-CM | POA: Diagnosis not present

## 2016-11-19 DIAGNOSIS — M4316 Spondylolisthesis, lumbar region: Secondary | ICD-10-CM | POA: Diagnosis present

## 2016-11-19 DIAGNOSIS — I25118 Atherosclerotic heart disease of native coronary artery with other forms of angina pectoris: Secondary | ICD-10-CM | POA: Diagnosis present

## 2016-11-19 DIAGNOSIS — I69322 Dysarthria following cerebral infarction: Secondary | ICD-10-CM | POA: Diagnosis not present

## 2016-11-19 DIAGNOSIS — I5032 Chronic diastolic (congestive) heart failure: Secondary | ICD-10-CM | POA: Diagnosis present

## 2016-11-19 DIAGNOSIS — R1310 Dysphagia, unspecified: Secondary | ICD-10-CM | POA: Diagnosis present

## 2016-11-19 DIAGNOSIS — F419 Anxiety disorder, unspecified: Secondary | ICD-10-CM | POA: Diagnosis present

## 2016-11-19 DIAGNOSIS — K219 Gastro-esophageal reflux disease without esophagitis: Secondary | ICD-10-CM | POA: Diagnosis present

## 2016-11-19 DIAGNOSIS — Z888 Allergy status to other drugs, medicaments and biological substances status: Secondary | ICD-10-CM | POA: Diagnosis not present

## 2016-11-19 DIAGNOSIS — Z4889 Encounter for other specified surgical aftercare: Secondary | ICD-10-CM | POA: Diagnosis not present

## 2016-11-19 DIAGNOSIS — E785 Hyperlipidemia, unspecified: Secondary | ICD-10-CM | POA: Diagnosis present

## 2016-11-19 DIAGNOSIS — Z7901 Long term (current) use of anticoagulants: Secondary | ICD-10-CM | POA: Diagnosis not present

## 2016-11-19 DIAGNOSIS — G4700 Insomnia, unspecified: Secondary | ICD-10-CM | POA: Diagnosis present

## 2016-11-19 DIAGNOSIS — R339 Retention of urine, unspecified: Secondary | ICD-10-CM | POA: Diagnosis present

## 2016-11-19 DIAGNOSIS — I110 Hypertensive heart disease with heart failure: Secondary | ICD-10-CM | POA: Diagnosis present

## 2016-11-19 DIAGNOSIS — I482 Chronic atrial fibrillation: Secondary | ICD-10-CM | POA: Diagnosis present

## 2016-11-19 DIAGNOSIS — L259 Unspecified contact dermatitis, unspecified cause: Secondary | ICD-10-CM | POA: Diagnosis not present

## 2016-11-19 DIAGNOSIS — I10 Essential (primary) hypertension: Secondary | ICD-10-CM | POA: Diagnosis not present

## 2016-11-19 DIAGNOSIS — Z87891 Personal history of nicotine dependence: Secondary | ICD-10-CM | POA: Diagnosis not present

## 2016-11-19 DIAGNOSIS — I639 Cerebral infarction, unspecified: Secondary | ICD-10-CM | POA: Diagnosis not present

## 2016-11-19 DIAGNOSIS — R918 Other nonspecific abnormal finding of lung field: Secondary | ICD-10-CM | POA: Diagnosis not present

## 2016-11-19 DIAGNOSIS — I69354 Hemiplegia and hemiparesis following cerebral infarction affecting left non-dominant side: Secondary | ICD-10-CM | POA: Diagnosis not present

## 2016-11-19 DIAGNOSIS — I350 Nonrheumatic aortic (valve) stenosis: Secondary | ICD-10-CM | POA: Diagnosis not present

## 2016-11-19 DIAGNOSIS — I739 Peripheral vascular disease, unspecified: Secondary | ICD-10-CM | POA: Diagnosis present

## 2016-11-19 DIAGNOSIS — I442 Atrioventricular block, complete: Secondary | ICD-10-CM | POA: Diagnosis not present

## 2016-11-19 DIAGNOSIS — E7800 Pure hypercholesterolemia, unspecified: Secondary | ICD-10-CM | POA: Diagnosis present

## 2016-11-19 DIAGNOSIS — I2510 Atherosclerotic heart disease of native coronary artery without angina pectoris: Secondary | ICD-10-CM | POA: Diagnosis not present

## 2016-11-19 DIAGNOSIS — E875 Hyperkalemia: Secondary | ICD-10-CM | POA: Diagnosis not present

## 2016-12-08 DIAGNOSIS — R0602 Shortness of breath: Secondary | ICD-10-CM | POA: Diagnosis not present

## 2016-12-08 DIAGNOSIS — Z952 Presence of prosthetic heart valve: Secondary | ICD-10-CM | POA: Diagnosis not present

## 2016-12-08 DIAGNOSIS — I350 Nonrheumatic aortic (valve) stenosis: Secondary | ICD-10-CM | POA: Diagnosis not present

## 2016-12-10 DIAGNOSIS — R2689 Other abnormalities of gait and mobility: Secondary | ICD-10-CM | POA: Diagnosis not present

## 2016-12-10 DIAGNOSIS — I5032 Chronic diastolic (congestive) heart failure: Secondary | ICD-10-CM | POA: Diagnosis not present

## 2016-12-10 DIAGNOSIS — I482 Chronic atrial fibrillation: Secondary | ICD-10-CM | POA: Diagnosis not present

## 2016-12-10 DIAGNOSIS — I350 Nonrheumatic aortic (valve) stenosis: Secondary | ICD-10-CM | POA: Diagnosis not present

## 2016-12-10 DIAGNOSIS — M6281 Muscle weakness (generalized): Secondary | ICD-10-CM | POA: Diagnosis not present

## 2016-12-10 DIAGNOSIS — I25118 Atherosclerotic heart disease of native coronary artery with other forms of angina pectoris: Secondary | ICD-10-CM | POA: Diagnosis not present

## 2016-12-15 DIAGNOSIS — Z79899 Other long term (current) drug therapy: Secondary | ICD-10-CM | POA: Diagnosis not present

## 2016-12-15 DIAGNOSIS — E559 Vitamin D deficiency, unspecified: Secondary | ICD-10-CM | POA: Diagnosis not present

## 2016-12-15 DIAGNOSIS — I5032 Chronic diastolic (congestive) heart failure: Secondary | ICD-10-CM | POA: Diagnosis not present

## 2016-12-15 DIAGNOSIS — R5382 Chronic fatigue, unspecified: Secondary | ICD-10-CM | POA: Diagnosis not present

## 2016-12-15 DIAGNOSIS — Z862 Personal history of diseases of the blood and blood-forming organs and certain disorders involving the immune mechanism: Secondary | ICD-10-CM | POA: Diagnosis not present

## 2016-12-15 DIAGNOSIS — Z1329 Encounter for screening for other suspected endocrine disorder: Secondary | ICD-10-CM | POA: Diagnosis not present

## 2016-12-15 DIAGNOSIS — R7989 Other specified abnormal findings of blood chemistry: Secondary | ICD-10-CM | POA: Diagnosis not present

## 2016-12-15 DIAGNOSIS — Z952 Presence of prosthetic heart valve: Secondary | ICD-10-CM | POA: Diagnosis not present

## 2016-12-15 DIAGNOSIS — I10 Essential (primary) hypertension: Secondary | ICD-10-CM | POA: Diagnosis not present

## 2016-12-17 DIAGNOSIS — R2689 Other abnormalities of gait and mobility: Secondary | ICD-10-CM | POA: Diagnosis not present

## 2016-12-17 DIAGNOSIS — R946 Abnormal results of thyroid function studies: Secondary | ICD-10-CM | POA: Diagnosis not present

## 2016-12-17 DIAGNOSIS — M6281 Muscle weakness (generalized): Secondary | ICD-10-CM | POA: Diagnosis not present

## 2016-12-17 DIAGNOSIS — I5032 Chronic diastolic (congestive) heart failure: Secondary | ICD-10-CM | POA: Diagnosis not present

## 2016-12-17 DIAGNOSIS — Z79899 Other long term (current) drug therapy: Secondary | ICD-10-CM | POA: Diagnosis not present

## 2016-12-17 DIAGNOSIS — I10 Essential (primary) hypertension: Secondary | ICD-10-CM | POA: Diagnosis not present

## 2016-12-18 DIAGNOSIS — M6281 Muscle weakness (generalized): Secondary | ICD-10-CM | POA: Diagnosis not present

## 2016-12-18 DIAGNOSIS — R2689 Other abnormalities of gait and mobility: Secondary | ICD-10-CM | POA: Diagnosis not present

## 2016-12-21 DIAGNOSIS — R2689 Other abnormalities of gait and mobility: Secondary | ICD-10-CM | POA: Diagnosis not present

## 2016-12-21 DIAGNOSIS — M6281 Muscle weakness (generalized): Secondary | ICD-10-CM | POA: Diagnosis not present

## 2016-12-22 DIAGNOSIS — I5032 Chronic diastolic (congestive) heart failure: Secondary | ICD-10-CM | POA: Diagnosis not present

## 2016-12-22 DIAGNOSIS — Z952 Presence of prosthetic heart valve: Secondary | ICD-10-CM | POA: Diagnosis not present

## 2016-12-22 DIAGNOSIS — Z79899 Other long term (current) drug therapy: Secondary | ICD-10-CM | POA: Diagnosis not present

## 2016-12-25 DIAGNOSIS — M6281 Muscle weakness (generalized): Secondary | ICD-10-CM | POA: Diagnosis not present

## 2016-12-25 DIAGNOSIS — R2689 Other abnormalities of gait and mobility: Secondary | ICD-10-CM | POA: Diagnosis not present

## 2016-12-29 DIAGNOSIS — I482 Chronic atrial fibrillation: Secondary | ICD-10-CM | POA: Diagnosis not present

## 2016-12-29 DIAGNOSIS — R2689 Other abnormalities of gait and mobility: Secondary | ICD-10-CM | POA: Diagnosis not present

## 2016-12-29 DIAGNOSIS — I5032 Chronic diastolic (congestive) heart failure: Secondary | ICD-10-CM | POA: Diagnosis not present

## 2016-12-29 DIAGNOSIS — E875 Hyperkalemia: Secondary | ICD-10-CM | POA: Diagnosis not present

## 2016-12-29 DIAGNOSIS — Z952 Presence of prosthetic heart valve: Secondary | ICD-10-CM | POA: Diagnosis not present

## 2016-12-29 DIAGNOSIS — M6281 Muscle weakness (generalized): Secondary | ICD-10-CM | POA: Diagnosis not present

## 2016-12-29 DIAGNOSIS — R946 Abnormal results of thyroid function studies: Secondary | ICD-10-CM | POA: Diagnosis not present

## 2016-12-31 DIAGNOSIS — E0590 Thyrotoxicosis, unspecified without thyrotoxic crisis or storm: Secondary | ICD-10-CM | POA: Diagnosis not present

## 2017-01-01 DIAGNOSIS — M6281 Muscle weakness (generalized): Secondary | ICD-10-CM | POA: Diagnosis not present

## 2017-01-01 DIAGNOSIS — R2689 Other abnormalities of gait and mobility: Secondary | ICD-10-CM | POA: Diagnosis not present

## 2017-01-05 DIAGNOSIS — M6281 Muscle weakness (generalized): Secondary | ICD-10-CM | POA: Diagnosis not present

## 2017-01-05 DIAGNOSIS — R2689 Other abnormalities of gait and mobility: Secondary | ICD-10-CM | POA: Diagnosis not present

## 2017-01-08 DIAGNOSIS — R2689 Other abnormalities of gait and mobility: Secondary | ICD-10-CM | POA: Diagnosis not present

## 2017-01-08 DIAGNOSIS — M6281 Muscle weakness (generalized): Secondary | ICD-10-CM | POA: Diagnosis not present

## 2017-01-12 DIAGNOSIS — M6281 Muscle weakness (generalized): Secondary | ICD-10-CM | POA: Diagnosis not present

## 2017-01-12 DIAGNOSIS — R2689 Other abnormalities of gait and mobility: Secondary | ICD-10-CM | POA: Diagnosis not present

## 2017-01-15 DIAGNOSIS — R2689 Other abnormalities of gait and mobility: Secondary | ICD-10-CM | POA: Diagnosis not present

## 2017-01-15 DIAGNOSIS — M6281 Muscle weakness (generalized): Secondary | ICD-10-CM | POA: Diagnosis not present

## 2017-01-18 DIAGNOSIS — Z950 Presence of cardiac pacemaker: Secondary | ICD-10-CM | POA: Diagnosis not present

## 2017-01-18 DIAGNOSIS — I482 Chronic atrial fibrillation: Secondary | ICD-10-CM | POA: Diagnosis not present

## 2017-01-18 DIAGNOSIS — Z952 Presence of prosthetic heart valve: Secondary | ICD-10-CM | POA: Diagnosis not present

## 2017-01-18 DIAGNOSIS — I442 Atrioventricular block, complete: Secondary | ICD-10-CM | POA: Diagnosis not present

## 2017-01-19 DIAGNOSIS — M6281 Muscle weakness (generalized): Secondary | ICD-10-CM | POA: Diagnosis not present

## 2017-01-19 DIAGNOSIS — R2689 Other abnormalities of gait and mobility: Secondary | ICD-10-CM | POA: Diagnosis not present

## 2017-01-22 DIAGNOSIS — R2689 Other abnormalities of gait and mobility: Secondary | ICD-10-CM | POA: Diagnosis not present

## 2017-01-22 DIAGNOSIS — M6281 Muscle weakness (generalized): Secondary | ICD-10-CM | POA: Diagnosis not present

## 2017-01-25 DIAGNOSIS — R2689 Other abnormalities of gait and mobility: Secondary | ICD-10-CM | POA: Diagnosis not present

## 2017-01-25 DIAGNOSIS — M6281 Muscle weakness (generalized): Secondary | ICD-10-CM | POA: Diagnosis not present

## 2017-01-27 DIAGNOSIS — E0590 Thyrotoxicosis, unspecified without thyrotoxic crisis or storm: Secondary | ICD-10-CM | POA: Diagnosis not present

## 2017-01-27 DIAGNOSIS — E559 Vitamin D deficiency, unspecified: Secondary | ICD-10-CM | POA: Diagnosis not present

## 2017-01-27 DIAGNOSIS — E7800 Pure hypercholesterolemia, unspecified: Secondary | ICD-10-CM | POA: Diagnosis not present

## 2017-01-27 DIAGNOSIS — I482 Chronic atrial fibrillation: Secondary | ICD-10-CM | POA: Diagnosis not present

## 2017-01-27 DIAGNOSIS — R531 Weakness: Secondary | ICD-10-CM | POA: Diagnosis not present

## 2017-01-27 DIAGNOSIS — K5909 Other constipation: Secondary | ICD-10-CM | POA: Diagnosis not present

## 2017-01-27 DIAGNOSIS — I10 Essential (primary) hypertension: Secondary | ICD-10-CM | POA: Diagnosis not present

## 2017-01-28 DIAGNOSIS — R2689 Other abnormalities of gait and mobility: Secondary | ICD-10-CM | POA: Diagnosis not present

## 2017-01-28 DIAGNOSIS — M6281 Muscle weakness (generalized): Secondary | ICD-10-CM | POA: Diagnosis not present

## 2017-02-01 DIAGNOSIS — M6281 Muscle weakness (generalized): Secondary | ICD-10-CM | POA: Diagnosis not present

## 2017-02-01 DIAGNOSIS — R2689 Other abnormalities of gait and mobility: Secondary | ICD-10-CM | POA: Diagnosis not present

## 2017-02-04 DIAGNOSIS — M6281 Muscle weakness (generalized): Secondary | ICD-10-CM | POA: Diagnosis not present

## 2017-02-04 DIAGNOSIS — R2689 Other abnormalities of gait and mobility: Secondary | ICD-10-CM | POA: Diagnosis not present

## 2017-02-08 DIAGNOSIS — M6281 Muscle weakness (generalized): Secondary | ICD-10-CM | POA: Diagnosis not present

## 2017-02-08 DIAGNOSIS — R2689 Other abnormalities of gait and mobility: Secondary | ICD-10-CM | POA: Diagnosis not present

## 2017-02-15 DIAGNOSIS — R2689 Other abnormalities of gait and mobility: Secondary | ICD-10-CM | POA: Diagnosis not present

## 2017-02-15 DIAGNOSIS — M6281 Muscle weakness (generalized): Secondary | ICD-10-CM | POA: Diagnosis not present

## 2017-02-24 DIAGNOSIS — M6281 Muscle weakness (generalized): Secondary | ICD-10-CM | POA: Diagnosis not present

## 2017-02-24 DIAGNOSIS — R2689 Other abnormalities of gait and mobility: Secondary | ICD-10-CM | POA: Diagnosis not present

## 2017-03-13 DIAGNOSIS — Z79899 Other long term (current) drug therapy: Secondary | ICD-10-CM | POA: Diagnosis not present

## 2017-03-13 DIAGNOSIS — I4891 Unspecified atrial fibrillation: Secondary | ICD-10-CM | POA: Diagnosis not present

## 2017-03-13 DIAGNOSIS — Z87891 Personal history of nicotine dependence: Secondary | ICD-10-CM | POA: Diagnosis not present

## 2017-03-13 DIAGNOSIS — Z952 Presence of prosthetic heart valve: Secondary | ICD-10-CM | POA: Diagnosis not present

## 2017-03-13 DIAGNOSIS — W19XXXA Unspecified fall, initial encounter: Secondary | ICD-10-CM | POA: Diagnosis not present

## 2017-03-13 DIAGNOSIS — Z789 Other specified health status: Secondary | ICD-10-CM | POA: Diagnosis not present

## 2017-03-13 DIAGNOSIS — M25562 Pain in left knee: Secondary | ICD-10-CM | POA: Diagnosis not present

## 2017-03-13 DIAGNOSIS — Y9301 Activity, walking, marching and hiking: Secondary | ICD-10-CM | POA: Diagnosis not present

## 2017-03-13 DIAGNOSIS — S72002A Fracture of unspecified part of neck of left femur, initial encounter for closed fracture: Secondary | ICD-10-CM | POA: Diagnosis not present

## 2017-03-13 DIAGNOSIS — Z7902 Long term (current) use of antithrombotics/antiplatelets: Secondary | ICD-10-CM | POA: Diagnosis not present

## 2017-03-14 DIAGNOSIS — Z8673 Personal history of transient ischemic attack (TIA), and cerebral infarction without residual deficits: Secondary | ICD-10-CM | POA: Diagnosis not present

## 2017-03-14 DIAGNOSIS — Z87891 Personal history of nicotine dependence: Secondary | ICD-10-CM | POA: Diagnosis not present

## 2017-03-14 DIAGNOSIS — Z7901 Long term (current) use of anticoagulants: Secondary | ICD-10-CM | POA: Diagnosis not present

## 2017-03-14 DIAGNOSIS — I350 Nonrheumatic aortic (valve) stenosis: Secondary | ICD-10-CM | POA: Diagnosis not present

## 2017-03-14 DIAGNOSIS — Z841 Family history of disorders of kidney and ureter: Secondary | ICD-10-CM | POA: Diagnosis not present

## 2017-03-14 DIAGNOSIS — I639 Cerebral infarction, unspecified: Secondary | ICD-10-CM | POA: Diagnosis not present

## 2017-03-14 DIAGNOSIS — Z888 Allergy status to other drugs, medicaments and biological substances status: Secondary | ICD-10-CM | POA: Diagnosis not present

## 2017-03-14 DIAGNOSIS — S72144A Nondisplaced intertrochanteric fracture of right femur, initial encounter for closed fracture: Secondary | ICD-10-CM | POA: Diagnosis not present

## 2017-03-14 DIAGNOSIS — M25552 Pain in left hip: Secondary | ICD-10-CM | POA: Diagnosis not present

## 2017-03-14 DIAGNOSIS — S72002A Fracture of unspecified part of neck of left femur, initial encounter for closed fracture: Secondary | ICD-10-CM | POA: Diagnosis not present

## 2017-03-14 DIAGNOSIS — M6281 Muscle weakness (generalized): Secondary | ICD-10-CM | POA: Diagnosis present

## 2017-03-14 DIAGNOSIS — Z7902 Long term (current) use of antithrombotics/antiplatelets: Secondary | ICD-10-CM | POA: Diagnosis not present

## 2017-03-14 DIAGNOSIS — S72002D Fracture of unspecified part of neck of left femur, subsequent encounter for closed fracture with routine healing: Secondary | ICD-10-CM | POA: Diagnosis not present

## 2017-03-14 DIAGNOSIS — M2550 Pain in unspecified joint: Secondary | ICD-10-CM | POA: Diagnosis not present

## 2017-03-14 DIAGNOSIS — I69354 Hemiplegia and hemiparesis following cerebral infarction affecting left non-dominant side: Secondary | ICD-10-CM | POA: Diagnosis not present

## 2017-03-14 DIAGNOSIS — Z818 Family history of other mental and behavioral disorders: Secondary | ICD-10-CM | POA: Diagnosis not present

## 2017-03-14 DIAGNOSIS — S72052A Unspecified fracture of head of left femur, initial encounter for closed fracture: Secondary | ICD-10-CM | POA: Diagnosis not present

## 2017-03-14 DIAGNOSIS — E7800 Pure hypercholesterolemia, unspecified: Secondary | ICD-10-CM | POA: Diagnosis present

## 2017-03-14 DIAGNOSIS — Z789 Other specified health status: Secondary | ICD-10-CM | POA: Diagnosis not present

## 2017-03-14 DIAGNOSIS — T84011A Broken internal left hip prosthesis, initial encounter: Secondary | ICD-10-CM | POA: Diagnosis not present

## 2017-03-14 DIAGNOSIS — R7303 Prediabetes: Secondary | ICD-10-CM | POA: Diagnosis present

## 2017-03-14 DIAGNOSIS — F418 Other specified anxiety disorders: Secondary | ICD-10-CM | POA: Diagnosis present

## 2017-03-14 DIAGNOSIS — S72012A Unspecified intracapsular fracture of left femur, initial encounter for closed fracture: Secondary | ICD-10-CM | POA: Diagnosis present

## 2017-03-14 DIAGNOSIS — M13841 Other specified arthritis, right hand: Secondary | ICD-10-CM | POA: Diagnosis present

## 2017-03-14 DIAGNOSIS — Z955 Presence of coronary angioplasty implant and graft: Secondary | ICD-10-CM | POA: Diagnosis not present

## 2017-03-14 DIAGNOSIS — I10 Essential (primary) hypertension: Secondary | ICD-10-CM | POA: Diagnosis not present

## 2017-03-14 DIAGNOSIS — E785 Hyperlipidemia, unspecified: Secondary | ICD-10-CM | POA: Diagnosis present

## 2017-03-14 DIAGNOSIS — D62 Acute posthemorrhagic anemia: Secondary | ICD-10-CM | POA: Diagnosis not present

## 2017-03-14 DIAGNOSIS — G9340 Encephalopathy, unspecified: Secondary | ICD-10-CM | POA: Diagnosis not present

## 2017-03-14 DIAGNOSIS — R918 Other nonspecific abnormal finding of lung field: Secondary | ICD-10-CM | POA: Diagnosis not present

## 2017-03-14 DIAGNOSIS — Z952 Presence of prosthetic heart valve: Secondary | ICD-10-CM | POA: Diagnosis not present

## 2017-03-14 DIAGNOSIS — M13842 Other specified arthritis, left hand: Secondary | ICD-10-CM | POA: Diagnosis present

## 2017-03-14 DIAGNOSIS — Z950 Presence of cardiac pacemaker: Secondary | ICD-10-CM | POA: Diagnosis not present

## 2017-03-14 DIAGNOSIS — Z01810 Encounter for preprocedural cardiovascular examination: Secondary | ICD-10-CM | POA: Diagnosis not present

## 2017-03-14 DIAGNOSIS — I4891 Unspecified atrial fibrillation: Secondary | ICD-10-CM | POA: Diagnosis not present

## 2017-03-14 DIAGNOSIS — G92 Toxic encephalopathy: Secondary | ICD-10-CM | POA: Diagnosis not present

## 2017-03-14 DIAGNOSIS — Z471 Aftercare following joint replacement surgery: Secondary | ICD-10-CM | POA: Diagnosis not present

## 2017-03-14 DIAGNOSIS — R262 Difficulty in walking, not elsewhere classified: Secondary | ICD-10-CM | POA: Diagnosis present

## 2017-03-14 DIAGNOSIS — M13862 Other specified arthritis, left knee: Secondary | ICD-10-CM | POA: Diagnosis present

## 2017-03-14 DIAGNOSIS — I739 Peripheral vascular disease, unspecified: Secondary | ICD-10-CM | POA: Diagnosis present

## 2017-03-14 DIAGNOSIS — K219 Gastro-esophageal reflux disease without esophagitis: Secondary | ICD-10-CM | POA: Diagnosis present

## 2017-03-14 DIAGNOSIS — Z743 Need for continuous supervision: Secondary | ICD-10-CM | POA: Diagnosis not present

## 2017-03-14 DIAGNOSIS — Z8249 Family history of ischemic heart disease and other diseases of the circulatory system: Secondary | ICD-10-CM | POA: Diagnosis not present

## 2017-03-14 DIAGNOSIS — R339 Retention of urine, unspecified: Secondary | ICD-10-CM | POA: Diagnosis not present

## 2017-03-14 DIAGNOSIS — Z7401 Bed confinement status: Secondary | ICD-10-CM | POA: Diagnosis not present

## 2017-03-14 DIAGNOSIS — Z823 Family history of stroke: Secondary | ICD-10-CM | POA: Diagnosis not present

## 2017-03-14 DIAGNOSIS — I25118 Atherosclerotic heart disease of native coronary artery with other forms of angina pectoris: Secondary | ICD-10-CM | POA: Diagnosis present

## 2017-03-14 DIAGNOSIS — I2510 Atherosclerotic heart disease of native coronary artery without angina pectoris: Secondary | ICD-10-CM | POA: Diagnosis present

## 2017-03-14 DIAGNOSIS — I482 Chronic atrial fibrillation: Secondary | ICD-10-CM | POA: Diagnosis present

## 2017-03-14 DIAGNOSIS — Z96642 Presence of left artificial hip joint: Secondary | ICD-10-CM | POA: Diagnosis not present

## 2017-03-14 DIAGNOSIS — F419 Anxiety disorder, unspecified: Secondary | ICD-10-CM | POA: Diagnosis present

## 2017-03-14 DIAGNOSIS — I480 Paroxysmal atrial fibrillation: Secondary | ICD-10-CM | POA: Diagnosis present

## 2017-03-14 DIAGNOSIS — I5032 Chronic diastolic (congestive) heart failure: Secondary | ICD-10-CM | POA: Diagnosis present

## 2017-03-14 DIAGNOSIS — J9811 Atelectasis: Secondary | ICD-10-CM | POA: Diagnosis not present

## 2017-03-14 DIAGNOSIS — I110 Hypertensive heart disease with heart failure: Secondary | ICD-10-CM | POA: Diagnosis present

## 2017-03-14 DIAGNOSIS — E079 Disorder of thyroid, unspecified: Secondary | ICD-10-CM | POA: Diagnosis present

## 2017-03-14 DIAGNOSIS — Z9049 Acquired absence of other specified parts of digestive tract: Secondary | ICD-10-CM | POA: Diagnosis not present

## 2017-03-14 DIAGNOSIS — Z79899 Other long term (current) drug therapy: Secondary | ICD-10-CM | POA: Diagnosis not present

## 2017-03-15 DIAGNOSIS — Z01810 Encounter for preprocedural cardiovascular examination: Secondary | ICD-10-CM | POA: Diagnosis not present

## 2017-03-18 DIAGNOSIS — I69354 Hemiplegia and hemiparesis following cerebral infarction affecting left non-dominant side: Secondary | ICD-10-CM | POA: Diagnosis not present

## 2017-03-18 DIAGNOSIS — E7800 Pure hypercholesterolemia, unspecified: Secondary | ICD-10-CM | POA: Diagnosis present

## 2017-03-18 DIAGNOSIS — Z841 Family history of disorders of kidney and ureter: Secondary | ICD-10-CM | POA: Diagnosis not present

## 2017-03-18 DIAGNOSIS — M13841 Other specified arthritis, right hand: Secondary | ICD-10-CM | POA: Diagnosis present

## 2017-03-18 DIAGNOSIS — M2550 Pain in unspecified joint: Secondary | ICD-10-CM | POA: Diagnosis not present

## 2017-03-18 DIAGNOSIS — I110 Hypertensive heart disease with heart failure: Secondary | ICD-10-CM | POA: Diagnosis present

## 2017-03-18 DIAGNOSIS — I2510 Atherosclerotic heart disease of native coronary artery without angina pectoris: Secondary | ICD-10-CM | POA: Diagnosis present

## 2017-03-18 DIAGNOSIS — M13842 Other specified arthritis, left hand: Secondary | ICD-10-CM | POA: Diagnosis present

## 2017-03-18 DIAGNOSIS — G9340 Encephalopathy, unspecified: Secondary | ICD-10-CM | POA: Diagnosis not present

## 2017-03-18 DIAGNOSIS — S72012A Unspecified intracapsular fracture of left femur, initial encounter for closed fracture: Secondary | ICD-10-CM | POA: Diagnosis not present

## 2017-03-18 DIAGNOSIS — Z8673 Personal history of transient ischemic attack (TIA), and cerebral infarction without residual deficits: Secondary | ICD-10-CM | POA: Diagnosis not present

## 2017-03-18 DIAGNOSIS — I25118 Atherosclerotic heart disease of native coronary artery with other forms of angina pectoris: Secondary | ICD-10-CM | POA: Diagnosis not present

## 2017-03-18 DIAGNOSIS — I5032 Chronic diastolic (congestive) heart failure: Secondary | ICD-10-CM | POA: Diagnosis present

## 2017-03-18 DIAGNOSIS — Z8249 Family history of ischemic heart disease and other diseases of the circulatory system: Secondary | ICD-10-CM | POA: Diagnosis not present

## 2017-03-18 DIAGNOSIS — M6281 Muscle weakness (generalized): Secondary | ICD-10-CM | POA: Diagnosis present

## 2017-03-18 DIAGNOSIS — Z823 Family history of stroke: Secondary | ICD-10-CM | POA: Diagnosis not present

## 2017-03-18 DIAGNOSIS — K219 Gastro-esophageal reflux disease without esophagitis: Secondary | ICD-10-CM | POA: Diagnosis present

## 2017-03-18 DIAGNOSIS — R262 Difficulty in walking, not elsewhere classified: Secondary | ICD-10-CM | POA: Diagnosis present

## 2017-03-18 DIAGNOSIS — F418 Other specified anxiety disorders: Secondary | ICD-10-CM | POA: Diagnosis present

## 2017-03-18 DIAGNOSIS — E079 Disorder of thyroid, unspecified: Secondary | ICD-10-CM | POA: Diagnosis present

## 2017-03-18 DIAGNOSIS — Z955 Presence of coronary angioplasty implant and graft: Secondary | ICD-10-CM | POA: Diagnosis not present

## 2017-03-18 DIAGNOSIS — Z952 Presence of prosthetic heart valve: Secondary | ICD-10-CM | POA: Diagnosis not present

## 2017-03-18 DIAGNOSIS — I739 Peripheral vascular disease, unspecified: Secondary | ICD-10-CM | POA: Diagnosis present

## 2017-03-18 DIAGNOSIS — M13862 Other specified arthritis, left knee: Secondary | ICD-10-CM | POA: Diagnosis present

## 2017-03-18 DIAGNOSIS — R7303 Prediabetes: Secondary | ICD-10-CM | POA: Diagnosis not present

## 2017-03-18 DIAGNOSIS — S72002D Fracture of unspecified part of neck of left femur, subsequent encounter for closed fracture with routine healing: Secondary | ICD-10-CM | POA: Diagnosis not present

## 2017-03-18 DIAGNOSIS — Z743 Need for continuous supervision: Secondary | ICD-10-CM | POA: Diagnosis not present

## 2017-03-18 DIAGNOSIS — Z87891 Personal history of nicotine dependence: Secondary | ICD-10-CM | POA: Diagnosis not present

## 2017-03-18 DIAGNOSIS — I480 Paroxysmal atrial fibrillation: Secondary | ICD-10-CM | POA: Diagnosis present

## 2017-03-18 DIAGNOSIS — E785 Hyperlipidemia, unspecified: Secondary | ICD-10-CM | POA: Diagnosis present

## 2017-03-18 DIAGNOSIS — Z818 Family history of other mental and behavioral disorders: Secondary | ICD-10-CM | POA: Diagnosis not present

## 2017-03-18 DIAGNOSIS — I10 Essential (primary) hypertension: Secondary | ICD-10-CM | POA: Diagnosis not present

## 2017-03-29 DIAGNOSIS — M6281 Muscle weakness (generalized): Secondary | ICD-10-CM | POA: Diagnosis not present

## 2017-03-29 DIAGNOSIS — S72002D Fracture of unspecified part of neck of left femur, subsequent encounter for closed fracture with routine healing: Secondary | ICD-10-CM | POA: Diagnosis not present

## 2017-03-29 DIAGNOSIS — Z950 Presence of cardiac pacemaker: Secondary | ICD-10-CM | POA: Diagnosis not present

## 2017-03-29 DIAGNOSIS — I5032 Chronic diastolic (congestive) heart failure: Secondary | ICD-10-CM | POA: Diagnosis not present

## 2017-03-29 DIAGNOSIS — I480 Paroxysmal atrial fibrillation: Secondary | ICD-10-CM | POA: Diagnosis not present

## 2017-03-29 DIAGNOSIS — Z96641 Presence of right artificial hip joint: Secondary | ICD-10-CM | POA: Diagnosis not present

## 2017-03-29 DIAGNOSIS — I739 Peripheral vascular disease, unspecified: Secondary | ICD-10-CM | POA: Diagnosis not present

## 2017-03-29 DIAGNOSIS — Z7901 Long term (current) use of anticoagulants: Secondary | ICD-10-CM | POA: Diagnosis not present

## 2017-03-29 DIAGNOSIS — Z87891 Personal history of nicotine dependence: Secondary | ICD-10-CM | POA: Diagnosis not present

## 2017-03-29 DIAGNOSIS — I110 Hypertensive heart disease with heart failure: Secondary | ICD-10-CM | POA: Diagnosis not present

## 2017-03-29 DIAGNOSIS — Z952 Presence of prosthetic heart valve: Secondary | ICD-10-CM | POA: Diagnosis not present

## 2017-03-29 DIAGNOSIS — R2689 Other abnormalities of gait and mobility: Secondary | ICD-10-CM | POA: Diagnosis not present

## 2017-03-29 DIAGNOSIS — K219 Gastro-esophageal reflux disease without esophagitis: Secondary | ICD-10-CM | POA: Diagnosis not present

## 2017-03-29 DIAGNOSIS — E559 Vitamin D deficiency, unspecified: Secondary | ICD-10-CM | POA: Diagnosis not present

## 2017-03-30 DIAGNOSIS — M6281 Muscle weakness (generalized): Secondary | ICD-10-CM | POA: Diagnosis not present

## 2017-03-30 DIAGNOSIS — Z87891 Personal history of nicotine dependence: Secondary | ICD-10-CM | POA: Diagnosis not present

## 2017-03-30 DIAGNOSIS — I110 Hypertensive heart disease with heart failure: Secondary | ICD-10-CM | POA: Diagnosis not present

## 2017-03-30 DIAGNOSIS — Z96642 Presence of left artificial hip joint: Secondary | ICD-10-CM | POA: Diagnosis not present

## 2017-03-30 DIAGNOSIS — S72002D Fracture of unspecified part of neck of left femur, subsequent encounter for closed fracture with routine healing: Secondary | ICD-10-CM | POA: Diagnosis not present

## 2017-03-30 DIAGNOSIS — R2689 Other abnormalities of gait and mobility: Secondary | ICD-10-CM | POA: Diagnosis not present

## 2017-03-30 DIAGNOSIS — I480 Paroxysmal atrial fibrillation: Secondary | ICD-10-CM | POA: Diagnosis not present

## 2017-03-30 DIAGNOSIS — S0990XA Unspecified injury of head, initial encounter: Secondary | ICD-10-CM | POA: Diagnosis not present

## 2017-03-30 DIAGNOSIS — I5032 Chronic diastolic (congestive) heart failure: Secondary | ICD-10-CM | POA: Diagnosis not present

## 2017-03-30 DIAGNOSIS — M1711 Unilateral primary osteoarthritis, right knee: Secondary | ICD-10-CM | POA: Diagnosis not present

## 2017-03-30 DIAGNOSIS — W19XXXA Unspecified fall, initial encounter: Secondary | ICD-10-CM | POA: Diagnosis not present

## 2017-03-30 DIAGNOSIS — S0003XA Contusion of scalp, initial encounter: Secondary | ICD-10-CM | POA: Diagnosis not present

## 2017-03-31 DIAGNOSIS — I110 Hypertensive heart disease with heart failure: Secondary | ICD-10-CM | POA: Diagnosis not present

## 2017-03-31 DIAGNOSIS — I480 Paroxysmal atrial fibrillation: Secondary | ICD-10-CM | POA: Diagnosis not present

## 2017-03-31 DIAGNOSIS — I5032 Chronic diastolic (congestive) heart failure: Secondary | ICD-10-CM | POA: Diagnosis not present

## 2017-03-31 DIAGNOSIS — R2689 Other abnormalities of gait and mobility: Secondary | ICD-10-CM | POA: Diagnosis not present

## 2017-03-31 DIAGNOSIS — M6281 Muscle weakness (generalized): Secondary | ICD-10-CM | POA: Diagnosis not present

## 2017-03-31 DIAGNOSIS — S72002D Fracture of unspecified part of neck of left femur, subsequent encounter for closed fracture with routine healing: Secondary | ICD-10-CM | POA: Diagnosis not present

## 2017-04-06 DIAGNOSIS — I5032 Chronic diastolic (congestive) heart failure: Secondary | ICD-10-CM | POA: Diagnosis not present

## 2017-04-06 DIAGNOSIS — M6281 Muscle weakness (generalized): Secondary | ICD-10-CM | POA: Diagnosis not present

## 2017-04-06 DIAGNOSIS — I110 Hypertensive heart disease with heart failure: Secondary | ICD-10-CM | POA: Diagnosis not present

## 2017-04-06 DIAGNOSIS — R2689 Other abnormalities of gait and mobility: Secondary | ICD-10-CM | POA: Diagnosis not present

## 2017-04-06 DIAGNOSIS — I480 Paroxysmal atrial fibrillation: Secondary | ICD-10-CM | POA: Diagnosis not present

## 2017-04-06 DIAGNOSIS — S72002D Fracture of unspecified part of neck of left femur, subsequent encounter for closed fracture with routine healing: Secondary | ICD-10-CM | POA: Diagnosis not present

## 2017-04-07 DIAGNOSIS — I5032 Chronic diastolic (congestive) heart failure: Secondary | ICD-10-CM | POA: Diagnosis not present

## 2017-04-07 DIAGNOSIS — M6281 Muscle weakness (generalized): Secondary | ICD-10-CM | POA: Diagnosis not present

## 2017-04-07 DIAGNOSIS — S72002D Fracture of unspecified part of neck of left femur, subsequent encounter for closed fracture with routine healing: Secondary | ICD-10-CM | POA: Diagnosis not present

## 2017-04-07 DIAGNOSIS — I110 Hypertensive heart disease with heart failure: Secondary | ICD-10-CM | POA: Diagnosis not present

## 2017-04-07 DIAGNOSIS — I480 Paroxysmal atrial fibrillation: Secondary | ICD-10-CM | POA: Diagnosis not present

## 2017-04-07 DIAGNOSIS — R2689 Other abnormalities of gait and mobility: Secondary | ICD-10-CM | POA: Diagnosis not present

## 2017-04-08 DIAGNOSIS — M6281 Muscle weakness (generalized): Secondary | ICD-10-CM | POA: Diagnosis not present

## 2017-04-08 DIAGNOSIS — I480 Paroxysmal atrial fibrillation: Secondary | ICD-10-CM | POA: Diagnosis not present

## 2017-04-08 DIAGNOSIS — I110 Hypertensive heart disease with heart failure: Secondary | ICD-10-CM | POA: Diagnosis not present

## 2017-04-08 DIAGNOSIS — R197 Diarrhea, unspecified: Secondary | ICD-10-CM | POA: Diagnosis not present

## 2017-04-08 DIAGNOSIS — S72002D Fracture of unspecified part of neck of left femur, subsequent encounter for closed fracture with routine healing: Secondary | ICD-10-CM | POA: Diagnosis not present

## 2017-04-08 DIAGNOSIS — I5032 Chronic diastolic (congestive) heart failure: Secondary | ICD-10-CM | POA: Diagnosis not present

## 2017-04-08 DIAGNOSIS — R1110 Vomiting, unspecified: Secondary | ICD-10-CM | POA: Diagnosis not present

## 2017-04-08 DIAGNOSIS — R2689 Other abnormalities of gait and mobility: Secondary | ICD-10-CM | POA: Diagnosis not present

## 2017-04-08 DIAGNOSIS — Z87891 Personal history of nicotine dependence: Secondary | ICD-10-CM | POA: Diagnosis not present

## 2017-04-09 DIAGNOSIS — S72002D Fracture of unspecified part of neck of left femur, subsequent encounter for closed fracture with routine healing: Secondary | ICD-10-CM | POA: Diagnosis not present

## 2017-04-09 DIAGNOSIS — M6281 Muscle weakness (generalized): Secondary | ICD-10-CM | POA: Diagnosis not present

## 2017-04-09 DIAGNOSIS — I110 Hypertensive heart disease with heart failure: Secondary | ICD-10-CM | POA: Diagnosis not present

## 2017-04-09 DIAGNOSIS — I480 Paroxysmal atrial fibrillation: Secondary | ICD-10-CM | POA: Diagnosis not present

## 2017-04-09 DIAGNOSIS — R2689 Other abnormalities of gait and mobility: Secondary | ICD-10-CM | POA: Diagnosis not present

## 2017-04-09 DIAGNOSIS — I5032 Chronic diastolic (congestive) heart failure: Secondary | ICD-10-CM | POA: Diagnosis not present

## 2017-04-12 DIAGNOSIS — M6281 Muscle weakness (generalized): Secondary | ICD-10-CM | POA: Diagnosis not present

## 2017-04-12 DIAGNOSIS — R2689 Other abnormalities of gait and mobility: Secondary | ICD-10-CM | POA: Diagnosis not present

## 2017-04-12 DIAGNOSIS — S72002D Fracture of unspecified part of neck of left femur, subsequent encounter for closed fracture with routine healing: Secondary | ICD-10-CM | POA: Diagnosis not present

## 2017-04-12 DIAGNOSIS — I5032 Chronic diastolic (congestive) heart failure: Secondary | ICD-10-CM | POA: Diagnosis not present

## 2017-04-12 DIAGNOSIS — I110 Hypertensive heart disease with heart failure: Secondary | ICD-10-CM | POA: Diagnosis not present

## 2017-04-12 DIAGNOSIS — I480 Paroxysmal atrial fibrillation: Secondary | ICD-10-CM | POA: Diagnosis not present

## 2017-04-13 DIAGNOSIS — S72002D Fracture of unspecified part of neck of left femur, subsequent encounter for closed fracture with routine healing: Secondary | ICD-10-CM | POA: Diagnosis not present

## 2017-04-13 DIAGNOSIS — R2689 Other abnormalities of gait and mobility: Secondary | ICD-10-CM | POA: Diagnosis not present

## 2017-04-13 DIAGNOSIS — M6281 Muscle weakness (generalized): Secondary | ICD-10-CM | POA: Diagnosis not present

## 2017-04-13 DIAGNOSIS — I480 Paroxysmal atrial fibrillation: Secondary | ICD-10-CM | POA: Diagnosis not present

## 2017-04-13 DIAGNOSIS — I110 Hypertensive heart disease with heart failure: Secondary | ICD-10-CM | POA: Diagnosis not present

## 2017-04-13 DIAGNOSIS — I5032 Chronic diastolic (congestive) heart failure: Secondary | ICD-10-CM | POA: Diagnosis not present

## 2017-04-14 DIAGNOSIS — I480 Paroxysmal atrial fibrillation: Secondary | ICD-10-CM | POA: Diagnosis not present

## 2017-04-14 DIAGNOSIS — M6281 Muscle weakness (generalized): Secondary | ICD-10-CM | POA: Diagnosis not present

## 2017-04-14 DIAGNOSIS — S72002D Fracture of unspecified part of neck of left femur, subsequent encounter for closed fracture with routine healing: Secondary | ICD-10-CM | POA: Diagnosis not present

## 2017-04-14 DIAGNOSIS — R2689 Other abnormalities of gait and mobility: Secondary | ICD-10-CM | POA: Diagnosis not present

## 2017-04-14 DIAGNOSIS — I110 Hypertensive heart disease with heart failure: Secondary | ICD-10-CM | POA: Diagnosis not present

## 2017-04-14 DIAGNOSIS — I5032 Chronic diastolic (congestive) heart failure: Secondary | ICD-10-CM | POA: Diagnosis not present

## 2017-04-15 DIAGNOSIS — I5032 Chronic diastolic (congestive) heart failure: Secondary | ICD-10-CM | POA: Diagnosis not present

## 2017-04-15 DIAGNOSIS — M6281 Muscle weakness (generalized): Secondary | ICD-10-CM | POA: Diagnosis not present

## 2017-04-15 DIAGNOSIS — I110 Hypertensive heart disease with heart failure: Secondary | ICD-10-CM | POA: Diagnosis not present

## 2017-04-15 DIAGNOSIS — I480 Paroxysmal atrial fibrillation: Secondary | ICD-10-CM | POA: Diagnosis not present

## 2017-04-15 DIAGNOSIS — S72002D Fracture of unspecified part of neck of left femur, subsequent encounter for closed fracture with routine healing: Secondary | ICD-10-CM | POA: Diagnosis not present

## 2017-04-15 DIAGNOSIS — R2689 Other abnormalities of gait and mobility: Secondary | ICD-10-CM | POA: Diagnosis not present

## 2017-04-16 DIAGNOSIS — Z9049 Acquired absence of other specified parts of digestive tract: Secondary | ICD-10-CM | POA: Diagnosis not present

## 2017-04-16 DIAGNOSIS — R946 Abnormal results of thyroid function studies: Secondary | ICD-10-CM | POA: Diagnosis not present

## 2017-04-16 DIAGNOSIS — Z681 Body mass index (BMI) 19 or less, adult: Secondary | ICD-10-CM | POA: Diagnosis not present

## 2017-04-16 DIAGNOSIS — I480 Paroxysmal atrial fibrillation: Secondary | ICD-10-CM | POA: Diagnosis not present

## 2017-04-16 DIAGNOSIS — I10 Essential (primary) hypertension: Secondary | ICD-10-CM | POA: Diagnosis not present

## 2017-04-16 DIAGNOSIS — Z8249 Family history of ischemic heart disease and other diseases of the circulatory system: Secondary | ICD-10-CM | POA: Diagnosis not present

## 2017-04-16 DIAGNOSIS — R638 Other symptoms and signs concerning food and fluid intake: Secondary | ICD-10-CM | POA: Diagnosis not present

## 2017-04-16 DIAGNOSIS — R112 Nausea with vomiting, unspecified: Secondary | ICD-10-CM | POA: Diagnosis not present

## 2017-04-16 DIAGNOSIS — E7800 Pure hypercholesterolemia, unspecified: Secondary | ICD-10-CM | POA: Diagnosis not present

## 2017-04-16 DIAGNOSIS — I2510 Atherosclerotic heart disease of native coronary artery without angina pectoris: Secondary | ICD-10-CM | POA: Diagnosis not present

## 2017-04-16 DIAGNOSIS — R7303 Prediabetes: Secondary | ICD-10-CM | POA: Diagnosis not present

## 2017-04-16 DIAGNOSIS — E0500 Thyrotoxicosis with diffuse goiter without thyrotoxic crisis or storm: Secondary | ICD-10-CM | POA: Diagnosis not present

## 2017-04-16 DIAGNOSIS — Z950 Presence of cardiac pacemaker: Secondary | ICD-10-CM | POA: Diagnosis not present

## 2017-04-16 DIAGNOSIS — Z941 Heart transplant status: Secondary | ICD-10-CM | POA: Diagnosis not present

## 2017-04-16 DIAGNOSIS — Z87891 Personal history of nicotine dependence: Secondary | ICD-10-CM | POA: Diagnosis not present

## 2017-04-16 DIAGNOSIS — I5032 Chronic diastolic (congestive) heart failure: Secondary | ICD-10-CM | POA: Diagnosis not present

## 2017-04-16 DIAGNOSIS — E785 Hyperlipidemia, unspecified: Secondary | ICD-10-CM | POA: Diagnosis not present

## 2017-04-16 DIAGNOSIS — Z8673 Personal history of transient ischemic attack (TIA), and cerebral infarction without residual deficits: Secondary | ICD-10-CM | POA: Diagnosis not present

## 2017-04-16 DIAGNOSIS — I350 Nonrheumatic aortic (valve) stenosis: Secondary | ICD-10-CM | POA: Diagnosis not present

## 2017-04-16 DIAGNOSIS — I739 Peripheral vascular disease, unspecified: Secondary | ICD-10-CM | POA: Diagnosis not present

## 2017-04-16 DIAGNOSIS — R531 Weakness: Secondary | ICD-10-CM | POA: Diagnosis not present

## 2017-04-16 DIAGNOSIS — R634 Abnormal weight loss: Secondary | ICD-10-CM | POA: Diagnosis not present

## 2017-04-16 DIAGNOSIS — Z96642 Presence of left artificial hip joint: Secondary | ICD-10-CM | POA: Diagnosis not present

## 2017-04-16 DIAGNOSIS — I442 Atrioventricular block, complete: Secondary | ICD-10-CM | POA: Diagnosis not present

## 2017-04-16 DIAGNOSIS — F419 Anxiety disorder, unspecified: Secondary | ICD-10-CM | POA: Diagnosis not present

## 2017-04-16 DIAGNOSIS — Z7901 Long term (current) use of anticoagulants: Secondary | ICD-10-CM | POA: Diagnosis not present

## 2017-04-16 DIAGNOSIS — Z952 Presence of prosthetic heart valve: Secondary | ICD-10-CM | POA: Diagnosis not present

## 2017-04-16 DIAGNOSIS — Z955 Presence of coronary angioplasty implant and graft: Secondary | ICD-10-CM | POA: Diagnosis not present

## 2017-04-19 DIAGNOSIS — R2689 Other abnormalities of gait and mobility: Secondary | ICD-10-CM | POA: Diagnosis not present

## 2017-04-19 DIAGNOSIS — M6281 Muscle weakness (generalized): Secondary | ICD-10-CM | POA: Diagnosis not present

## 2017-04-19 DIAGNOSIS — I5032 Chronic diastolic (congestive) heart failure: Secondary | ICD-10-CM | POA: Diagnosis not present

## 2017-04-19 DIAGNOSIS — S72002D Fracture of unspecified part of neck of left femur, subsequent encounter for closed fracture with routine healing: Secondary | ICD-10-CM | POA: Diagnosis not present

## 2017-04-19 DIAGNOSIS — I480 Paroxysmal atrial fibrillation: Secondary | ICD-10-CM | POA: Diagnosis not present

## 2017-04-19 DIAGNOSIS — I110 Hypertensive heart disease with heart failure: Secondary | ICD-10-CM | POA: Diagnosis not present

## 2017-04-20 DIAGNOSIS — I5032 Chronic diastolic (congestive) heart failure: Secondary | ICD-10-CM | POA: Diagnosis not present

## 2017-04-20 DIAGNOSIS — M6281 Muscle weakness (generalized): Secondary | ICD-10-CM | POA: Diagnosis not present

## 2017-04-20 DIAGNOSIS — R2689 Other abnormalities of gait and mobility: Secondary | ICD-10-CM | POA: Diagnosis not present

## 2017-04-20 DIAGNOSIS — S72002D Fracture of unspecified part of neck of left femur, subsequent encounter for closed fracture with routine healing: Secondary | ICD-10-CM | POA: Diagnosis not present

## 2017-04-20 DIAGNOSIS — I480 Paroxysmal atrial fibrillation: Secondary | ICD-10-CM | POA: Diagnosis not present

## 2017-04-20 DIAGNOSIS — I110 Hypertensive heart disease with heart failure: Secondary | ICD-10-CM | POA: Diagnosis not present

## 2017-04-21 DIAGNOSIS — S72002D Fracture of unspecified part of neck of left femur, subsequent encounter for closed fracture with routine healing: Secondary | ICD-10-CM | POA: Diagnosis not present

## 2017-04-21 DIAGNOSIS — I5032 Chronic diastolic (congestive) heart failure: Secondary | ICD-10-CM | POA: Diagnosis not present

## 2017-04-21 DIAGNOSIS — I110 Hypertensive heart disease with heart failure: Secondary | ICD-10-CM | POA: Diagnosis not present

## 2017-04-21 DIAGNOSIS — M6281 Muscle weakness (generalized): Secondary | ICD-10-CM | POA: Diagnosis not present

## 2017-04-21 DIAGNOSIS — R2689 Other abnormalities of gait and mobility: Secondary | ICD-10-CM | POA: Diagnosis not present

## 2017-04-21 DIAGNOSIS — I480 Paroxysmal atrial fibrillation: Secondary | ICD-10-CM | POA: Diagnosis not present

## 2017-04-22 DIAGNOSIS — R2689 Other abnormalities of gait and mobility: Secondary | ICD-10-CM | POA: Diagnosis not present

## 2017-04-22 DIAGNOSIS — I110 Hypertensive heart disease with heart failure: Secondary | ICD-10-CM | POA: Diagnosis not present

## 2017-04-22 DIAGNOSIS — S72002D Fracture of unspecified part of neck of left femur, subsequent encounter for closed fracture with routine healing: Secondary | ICD-10-CM | POA: Diagnosis not present

## 2017-04-22 DIAGNOSIS — I480 Paroxysmal atrial fibrillation: Secondary | ICD-10-CM | POA: Diagnosis not present

## 2017-04-22 DIAGNOSIS — M6281 Muscle weakness (generalized): Secondary | ICD-10-CM | POA: Diagnosis not present

## 2017-04-22 DIAGNOSIS — I5032 Chronic diastolic (congestive) heart failure: Secondary | ICD-10-CM | POA: Diagnosis not present

## 2017-04-23 DIAGNOSIS — M6281 Muscle weakness (generalized): Secondary | ICD-10-CM | POA: Diagnosis not present

## 2017-04-23 DIAGNOSIS — I110 Hypertensive heart disease with heart failure: Secondary | ICD-10-CM | POA: Diagnosis not present

## 2017-04-23 DIAGNOSIS — S72002D Fracture of unspecified part of neck of left femur, subsequent encounter for closed fracture with routine healing: Secondary | ICD-10-CM | POA: Diagnosis not present

## 2017-04-23 DIAGNOSIS — I480 Paroxysmal atrial fibrillation: Secondary | ICD-10-CM | POA: Diagnosis not present

## 2017-04-23 DIAGNOSIS — R2689 Other abnormalities of gait and mobility: Secondary | ICD-10-CM | POA: Diagnosis not present

## 2017-04-23 DIAGNOSIS — I5032 Chronic diastolic (congestive) heart failure: Secondary | ICD-10-CM | POA: Diagnosis not present

## 2017-04-26 DIAGNOSIS — I110 Hypertensive heart disease with heart failure: Secondary | ICD-10-CM | POA: Diagnosis not present

## 2017-04-26 DIAGNOSIS — S72002D Fracture of unspecified part of neck of left femur, subsequent encounter for closed fracture with routine healing: Secondary | ICD-10-CM | POA: Diagnosis not present

## 2017-04-26 DIAGNOSIS — I480 Paroxysmal atrial fibrillation: Secondary | ICD-10-CM | POA: Diagnosis not present

## 2017-04-26 DIAGNOSIS — I5032 Chronic diastolic (congestive) heart failure: Secondary | ICD-10-CM | POA: Diagnosis not present

## 2017-04-26 DIAGNOSIS — M6281 Muscle weakness (generalized): Secondary | ICD-10-CM | POA: Diagnosis not present

## 2017-04-26 DIAGNOSIS — R2689 Other abnormalities of gait and mobility: Secondary | ICD-10-CM | POA: Diagnosis not present

## 2017-04-29 DIAGNOSIS — Z23 Encounter for immunization: Secondary | ICD-10-CM | POA: Diagnosis not present

## 2017-04-29 DIAGNOSIS — I442 Atrioventricular block, complete: Secondary | ICD-10-CM | POA: Diagnosis not present

## 2017-04-29 DIAGNOSIS — M6281 Muscle weakness (generalized): Secondary | ICD-10-CM | POA: Diagnosis not present

## 2017-04-29 DIAGNOSIS — I5032 Chronic diastolic (congestive) heart failure: Secondary | ICD-10-CM | POA: Diagnosis not present

## 2017-04-29 DIAGNOSIS — I10 Essential (primary) hypertension: Secondary | ICD-10-CM | POA: Diagnosis not present

## 2017-04-29 DIAGNOSIS — Z13820 Encounter for screening for osteoporosis: Secondary | ICD-10-CM | POA: Diagnosis not present

## 2017-04-29 DIAGNOSIS — I110 Hypertensive heart disease with heart failure: Secondary | ICD-10-CM | POA: Diagnosis not present

## 2017-04-29 DIAGNOSIS — Z780 Asymptomatic menopausal state: Secondary | ICD-10-CM | POA: Diagnosis not present

## 2017-04-29 DIAGNOSIS — E0590 Thyrotoxicosis, unspecified without thyrotoxic crisis or storm: Secondary | ICD-10-CM | POA: Diagnosis not present

## 2017-04-29 DIAGNOSIS — R2689 Other abnormalities of gait and mobility: Secondary | ICD-10-CM | POA: Diagnosis not present

## 2017-04-29 DIAGNOSIS — I480 Paroxysmal atrial fibrillation: Secondary | ICD-10-CM | POA: Diagnosis not present

## 2017-04-29 DIAGNOSIS — R5381 Other malaise: Secondary | ICD-10-CM | POA: Diagnosis not present

## 2017-04-29 DIAGNOSIS — I350 Nonrheumatic aortic (valve) stenosis: Secondary | ICD-10-CM | POA: Diagnosis not present

## 2017-04-29 DIAGNOSIS — S72002D Fracture of unspecified part of neck of left femur, subsequent encounter for closed fracture with routine healing: Secondary | ICD-10-CM | POA: Diagnosis not present

## 2017-04-29 DIAGNOSIS — Z8781 Personal history of (healed) traumatic fracture: Secondary | ICD-10-CM | POA: Diagnosis not present

## 2017-04-30 DIAGNOSIS — I110 Hypertensive heart disease with heart failure: Secondary | ICD-10-CM | POA: Diagnosis not present

## 2017-04-30 DIAGNOSIS — S72002D Fracture of unspecified part of neck of left femur, subsequent encounter for closed fracture with routine healing: Secondary | ICD-10-CM | POA: Diagnosis not present

## 2017-04-30 DIAGNOSIS — R2689 Other abnormalities of gait and mobility: Secondary | ICD-10-CM | POA: Diagnosis not present

## 2017-04-30 DIAGNOSIS — I480 Paroxysmal atrial fibrillation: Secondary | ICD-10-CM | POA: Diagnosis not present

## 2017-04-30 DIAGNOSIS — I5032 Chronic diastolic (congestive) heart failure: Secondary | ICD-10-CM | POA: Diagnosis not present

## 2017-04-30 DIAGNOSIS — M6281 Muscle weakness (generalized): Secondary | ICD-10-CM | POA: Diagnosis not present

## 2017-05-04 DIAGNOSIS — I110 Hypertensive heart disease with heart failure: Secondary | ICD-10-CM | POA: Diagnosis not present

## 2017-05-04 DIAGNOSIS — S72002D Fracture of unspecified part of neck of left femur, subsequent encounter for closed fracture with routine healing: Secondary | ICD-10-CM | POA: Diagnosis not present

## 2017-05-04 DIAGNOSIS — I5032 Chronic diastolic (congestive) heart failure: Secondary | ICD-10-CM | POA: Diagnosis not present

## 2017-05-04 DIAGNOSIS — M6281 Muscle weakness (generalized): Secondary | ICD-10-CM | POA: Diagnosis not present

## 2017-05-04 DIAGNOSIS — I480 Paroxysmal atrial fibrillation: Secondary | ICD-10-CM | POA: Diagnosis not present

## 2017-05-04 DIAGNOSIS — R2689 Other abnormalities of gait and mobility: Secondary | ICD-10-CM | POA: Diagnosis not present

## 2017-05-05 DIAGNOSIS — I110 Hypertensive heart disease with heart failure: Secondary | ICD-10-CM | POA: Diagnosis not present

## 2017-05-05 DIAGNOSIS — R2689 Other abnormalities of gait and mobility: Secondary | ICD-10-CM | POA: Diagnosis not present

## 2017-05-05 DIAGNOSIS — M6281 Muscle weakness (generalized): Secondary | ICD-10-CM | POA: Diagnosis not present

## 2017-05-05 DIAGNOSIS — I5032 Chronic diastolic (congestive) heart failure: Secondary | ICD-10-CM | POA: Diagnosis not present

## 2017-05-05 DIAGNOSIS — S72002D Fracture of unspecified part of neck of left femur, subsequent encounter for closed fracture with routine healing: Secondary | ICD-10-CM | POA: Diagnosis not present

## 2017-05-05 DIAGNOSIS — I480 Paroxysmal atrial fibrillation: Secondary | ICD-10-CM | POA: Diagnosis not present

## 2017-05-07 DIAGNOSIS — R2689 Other abnormalities of gait and mobility: Secondary | ICD-10-CM | POA: Diagnosis not present

## 2017-05-07 DIAGNOSIS — M6281 Muscle weakness (generalized): Secondary | ICD-10-CM | POA: Diagnosis not present

## 2017-05-07 DIAGNOSIS — I110 Hypertensive heart disease with heart failure: Secondary | ICD-10-CM | POA: Diagnosis not present

## 2017-05-07 DIAGNOSIS — I5032 Chronic diastolic (congestive) heart failure: Secondary | ICD-10-CM | POA: Diagnosis not present

## 2017-05-07 DIAGNOSIS — I480 Paroxysmal atrial fibrillation: Secondary | ICD-10-CM | POA: Diagnosis not present

## 2017-05-07 DIAGNOSIS — S72002D Fracture of unspecified part of neck of left femur, subsequent encounter for closed fracture with routine healing: Secondary | ICD-10-CM | POA: Diagnosis not present

## 2017-05-10 DIAGNOSIS — M6281 Muscle weakness (generalized): Secondary | ICD-10-CM | POA: Diagnosis not present

## 2017-05-10 DIAGNOSIS — S72002D Fracture of unspecified part of neck of left femur, subsequent encounter for closed fracture with routine healing: Secondary | ICD-10-CM | POA: Diagnosis not present

## 2017-05-10 DIAGNOSIS — I110 Hypertensive heart disease with heart failure: Secondary | ICD-10-CM | POA: Diagnosis not present

## 2017-05-10 DIAGNOSIS — I5032 Chronic diastolic (congestive) heart failure: Secondary | ICD-10-CM | POA: Diagnosis not present

## 2017-05-10 DIAGNOSIS — I480 Paroxysmal atrial fibrillation: Secondary | ICD-10-CM | POA: Diagnosis not present

## 2017-05-10 DIAGNOSIS — R2689 Other abnormalities of gait and mobility: Secondary | ICD-10-CM | POA: Diagnosis not present

## 2017-05-11 DIAGNOSIS — R2689 Other abnormalities of gait and mobility: Secondary | ICD-10-CM | POA: Diagnosis not present

## 2017-05-11 DIAGNOSIS — I5032 Chronic diastolic (congestive) heart failure: Secondary | ICD-10-CM | POA: Diagnosis not present

## 2017-05-11 DIAGNOSIS — S72002D Fracture of unspecified part of neck of left femur, subsequent encounter for closed fracture with routine healing: Secondary | ICD-10-CM | POA: Diagnosis not present

## 2017-05-11 DIAGNOSIS — M6281 Muscle weakness (generalized): Secondary | ICD-10-CM | POA: Diagnosis not present

## 2017-05-11 DIAGNOSIS — I480 Paroxysmal atrial fibrillation: Secondary | ICD-10-CM | POA: Diagnosis not present

## 2017-05-11 DIAGNOSIS — I110 Hypertensive heart disease with heart failure: Secondary | ICD-10-CM | POA: Diagnosis not present

## 2017-05-12 DIAGNOSIS — I110 Hypertensive heart disease with heart failure: Secondary | ICD-10-CM | POA: Diagnosis not present

## 2017-05-12 DIAGNOSIS — S72002D Fracture of unspecified part of neck of left femur, subsequent encounter for closed fracture with routine healing: Secondary | ICD-10-CM | POA: Diagnosis not present

## 2017-05-12 DIAGNOSIS — E0590 Thyrotoxicosis, unspecified without thyrotoxic crisis or storm: Secondary | ICD-10-CM | POA: Diagnosis not present

## 2017-05-12 DIAGNOSIS — R2689 Other abnormalities of gait and mobility: Secondary | ICD-10-CM | POA: Diagnosis not present

## 2017-05-12 DIAGNOSIS — I480 Paroxysmal atrial fibrillation: Secondary | ICD-10-CM | POA: Diagnosis not present

## 2017-05-12 DIAGNOSIS — I5032 Chronic diastolic (congestive) heart failure: Secondary | ICD-10-CM | POA: Diagnosis not present

## 2017-05-12 DIAGNOSIS — M6281 Muscle weakness (generalized): Secondary | ICD-10-CM | POA: Diagnosis not present

## 2017-05-13 ENCOUNTER — Telehealth: Payer: Self-pay | Admitting: Internal Medicine

## 2017-05-13 NOTE — Telephone Encounter (Signed)
Ok Thx 

## 2017-05-13 NOTE — Telephone Encounter (Signed)
[name] was a previous patient of yours (last seen 01/2012). She has moved back to [place] and would like to reestablish with you. Would you be willing to see her again?

## 2017-05-14 NOTE — Telephone Encounter (Signed)
I called to let the daughter know. She said that she would call back to schedule when they have moved her back to [place].

## 2017-05-18 DIAGNOSIS — I480 Paroxysmal atrial fibrillation: Secondary | ICD-10-CM | POA: Diagnosis not present

## 2017-05-18 DIAGNOSIS — M6281 Muscle weakness (generalized): Secondary | ICD-10-CM | POA: Diagnosis not present

## 2017-05-18 DIAGNOSIS — I5032 Chronic diastolic (congestive) heart failure: Secondary | ICD-10-CM | POA: Diagnosis not present

## 2017-05-18 DIAGNOSIS — S72002D Fracture of unspecified part of neck of left femur, subsequent encounter for closed fracture with routine healing: Secondary | ICD-10-CM | POA: Diagnosis not present

## 2017-05-18 DIAGNOSIS — Z8781 Personal history of (healed) traumatic fracture: Secondary | ICD-10-CM | POA: Diagnosis not present

## 2017-05-18 DIAGNOSIS — I110 Hypertensive heart disease with heart failure: Secondary | ICD-10-CM | POA: Diagnosis not present

## 2017-05-18 DIAGNOSIS — Z780 Asymptomatic menopausal state: Secondary | ICD-10-CM | POA: Diagnosis not present

## 2017-05-18 DIAGNOSIS — Z13820 Encounter for screening for osteoporosis: Secondary | ICD-10-CM | POA: Diagnosis not present

## 2017-05-18 DIAGNOSIS — M810 Age-related osteoporosis without current pathological fracture: Secondary | ICD-10-CM | POA: Diagnosis not present

## 2017-05-18 DIAGNOSIS — R2689 Other abnormalities of gait and mobility: Secondary | ICD-10-CM | POA: Diagnosis not present

## 2017-05-19 DIAGNOSIS — E0590 Thyrotoxicosis, unspecified without thyrotoxic crisis or storm: Secondary | ICD-10-CM | POA: Diagnosis not present

## 2017-05-20 DIAGNOSIS — S72002D Fracture of unspecified part of neck of left femur, subsequent encounter for closed fracture with routine healing: Secondary | ICD-10-CM | POA: Diagnosis not present

## 2017-05-20 DIAGNOSIS — M6281 Muscle weakness (generalized): Secondary | ICD-10-CM | POA: Diagnosis not present

## 2017-05-20 DIAGNOSIS — I110 Hypertensive heart disease with heart failure: Secondary | ICD-10-CM | POA: Diagnosis not present

## 2017-05-20 DIAGNOSIS — I480 Paroxysmal atrial fibrillation: Secondary | ICD-10-CM | POA: Diagnosis not present

## 2017-05-20 DIAGNOSIS — I5032 Chronic diastolic (congestive) heart failure: Secondary | ICD-10-CM | POA: Diagnosis not present

## 2017-05-20 DIAGNOSIS — R2689 Other abnormalities of gait and mobility: Secondary | ICD-10-CM | POA: Diagnosis not present

## 2017-05-21 DIAGNOSIS — R2689 Other abnormalities of gait and mobility: Secondary | ICD-10-CM | POA: Diagnosis not present

## 2017-05-21 DIAGNOSIS — I480 Paroxysmal atrial fibrillation: Secondary | ICD-10-CM | POA: Diagnosis not present

## 2017-05-21 DIAGNOSIS — I5032 Chronic diastolic (congestive) heart failure: Secondary | ICD-10-CM | POA: Diagnosis not present

## 2017-05-21 DIAGNOSIS — S72002D Fracture of unspecified part of neck of left femur, subsequent encounter for closed fracture with routine healing: Secondary | ICD-10-CM | POA: Diagnosis not present

## 2017-05-21 DIAGNOSIS — M6281 Muscle weakness (generalized): Secondary | ICD-10-CM | POA: Diagnosis not present

## 2017-05-21 DIAGNOSIS — I110 Hypertensive heart disease with heart failure: Secondary | ICD-10-CM | POA: Diagnosis not present

## 2017-05-24 DIAGNOSIS — I480 Paroxysmal atrial fibrillation: Secondary | ICD-10-CM | POA: Diagnosis not present

## 2017-05-24 DIAGNOSIS — I110 Hypertensive heart disease with heart failure: Secondary | ICD-10-CM | POA: Diagnosis not present

## 2017-05-24 DIAGNOSIS — I5032 Chronic diastolic (congestive) heart failure: Secondary | ICD-10-CM | POA: Diagnosis not present

## 2017-05-24 DIAGNOSIS — M6281 Muscle weakness (generalized): Secondary | ICD-10-CM | POA: Diagnosis not present

## 2017-05-24 DIAGNOSIS — R2689 Other abnormalities of gait and mobility: Secondary | ICD-10-CM | POA: Diagnosis not present

## 2017-05-24 DIAGNOSIS — S72002D Fracture of unspecified part of neck of left femur, subsequent encounter for closed fracture with routine healing: Secondary | ICD-10-CM | POA: Diagnosis not present

## 2017-05-25 DIAGNOSIS — I110 Hypertensive heart disease with heart failure: Secondary | ICD-10-CM | POA: Diagnosis not present

## 2017-05-25 DIAGNOSIS — S72002D Fracture of unspecified part of neck of left femur, subsequent encounter for closed fracture with routine healing: Secondary | ICD-10-CM | POA: Diagnosis not present

## 2017-05-25 DIAGNOSIS — M6281 Muscle weakness (generalized): Secondary | ICD-10-CM | POA: Diagnosis not present

## 2017-05-25 DIAGNOSIS — I5032 Chronic diastolic (congestive) heart failure: Secondary | ICD-10-CM | POA: Diagnosis not present

## 2017-05-25 DIAGNOSIS — R2689 Other abnormalities of gait and mobility: Secondary | ICD-10-CM | POA: Diagnosis not present

## 2017-05-25 DIAGNOSIS — I480 Paroxysmal atrial fibrillation: Secondary | ICD-10-CM | POA: Diagnosis not present

## 2017-05-27 DIAGNOSIS — Z952 Presence of prosthetic heart valve: Secondary | ICD-10-CM | POA: Diagnosis not present

## 2017-05-27 DIAGNOSIS — I442 Atrioventricular block, complete: Secondary | ICD-10-CM | POA: Diagnosis not present

## 2017-05-27 DIAGNOSIS — I480 Paroxysmal atrial fibrillation: Secondary | ICD-10-CM | POA: Diagnosis not present

## 2017-05-27 DIAGNOSIS — I5032 Chronic diastolic (congestive) heart failure: Secondary | ICD-10-CM | POA: Diagnosis not present

## 2017-05-27 DIAGNOSIS — E0590 Thyrotoxicosis, unspecified without thyrotoxic crisis or storm: Secondary | ICD-10-CM | POA: Diagnosis not present

## 2017-05-27 DIAGNOSIS — I10 Essential (primary) hypertension: Secondary | ICD-10-CM | POA: Diagnosis not present

## 2017-05-27 DIAGNOSIS — Z111 Encounter for screening for respiratory tuberculosis: Secondary | ICD-10-CM | POA: Diagnosis not present

## 2017-05-27 DIAGNOSIS — E782 Mixed hyperlipidemia: Secondary | ICD-10-CM | POA: Diagnosis not present

## 2017-05-27 DIAGNOSIS — I110 Hypertensive heart disease with heart failure: Secondary | ICD-10-CM | POA: Diagnosis not present

## 2017-05-27 DIAGNOSIS — R2689 Other abnormalities of gait and mobility: Secondary | ICD-10-CM | POA: Diagnosis not present

## 2017-05-27 DIAGNOSIS — M6281 Muscle weakness (generalized): Secondary | ICD-10-CM | POA: Diagnosis not present

## 2017-05-27 DIAGNOSIS — Z955 Presence of coronary angioplasty implant and graft: Secondary | ICD-10-CM | POA: Diagnosis not present

## 2017-05-27 DIAGNOSIS — K219 Gastro-esophageal reflux disease without esophagitis: Secondary | ICD-10-CM | POA: Diagnosis not present

## 2017-05-27 DIAGNOSIS — S72002D Fracture of unspecified part of neck of left femur, subsequent encounter for closed fracture with routine healing: Secondary | ICD-10-CM | POA: Diagnosis not present

## 2017-05-27 DIAGNOSIS — I482 Chronic atrial fibrillation: Secondary | ICD-10-CM | POA: Diagnosis not present

## 2017-06-14 ENCOUNTER — Ambulatory Visit (INDEPENDENT_AMBULATORY_CARE_PROVIDER_SITE_OTHER): Payer: Medicare Other | Admitting: Internal Medicine

## 2017-06-14 ENCOUNTER — Encounter: Payer: Self-pay | Admitting: Internal Medicine

## 2017-06-14 ENCOUNTER — Other Ambulatory Visit (INDEPENDENT_AMBULATORY_CARE_PROVIDER_SITE_OTHER): Payer: Medicare Other

## 2017-06-14 VITALS — BP 132/86 | HR 63 | Temp 98.4°F | Ht 64.5 in | Wt 110.0 lb

## 2017-06-14 DIAGNOSIS — Z23 Encounter for immunization: Secondary | ICD-10-CM | POA: Diagnosis not present

## 2017-06-14 DIAGNOSIS — E785 Hyperlipidemia, unspecified: Secondary | ICD-10-CM

## 2017-06-14 DIAGNOSIS — R202 Paresthesia of skin: Secondary | ICD-10-CM

## 2017-06-14 DIAGNOSIS — I482 Chronic atrial fibrillation, unspecified: Secondary | ICD-10-CM

## 2017-06-14 DIAGNOSIS — R413 Other amnesia: Secondary | ICD-10-CM | POA: Insufficient documentation

## 2017-06-14 DIAGNOSIS — I10 Essential (primary) hypertension: Secondary | ICD-10-CM | POA: Diagnosis not present

## 2017-06-14 DIAGNOSIS — E0590 Thyrotoxicosis, unspecified without thyrotoxic crisis or storm: Secondary | ICD-10-CM

## 2017-06-14 DIAGNOSIS — I359 Nonrheumatic aortic valve disorder, unspecified: Secondary | ICD-10-CM | POA: Diagnosis not present

## 2017-06-14 LAB — URINALYSIS
BILIRUBIN URINE: NEGATIVE
HGB URINE DIPSTICK: NEGATIVE
KETONES UR: NEGATIVE
Leukocytes, UA: NEGATIVE
Nitrite: NEGATIVE
Specific Gravity, Urine: 1.015 (ref 1.000–1.030)
URINE GLUCOSE: NEGATIVE
UROBILINOGEN UA: 0.2 (ref 0.0–1.0)
pH: 7 (ref 5.0–8.0)

## 2017-06-14 LAB — CBC WITH DIFFERENTIAL/PLATELET
BASOS ABS: 0.1 10*3/uL (ref 0.0–0.1)
Basophils Relative: 1.3 % (ref 0.0–3.0)
EOS ABS: 0.1 10*3/uL (ref 0.0–0.7)
Eosinophils Relative: 1.5 % (ref 0.0–5.0)
HEMATOCRIT: 36.8 % (ref 36.0–46.0)
HEMOGLOBIN: 11.9 g/dL — AB (ref 12.0–15.0)
LYMPHS PCT: 15.8 % (ref 12.0–46.0)
Lymphs Abs: 1.2 10*3/uL (ref 0.7–4.0)
MCHC: 32.4 g/dL (ref 30.0–36.0)
MCV: 87.3 fl (ref 78.0–100.0)
Monocytes Absolute: 0.7 10*3/uL (ref 0.1–1.0)
Monocytes Relative: 8.6 % (ref 3.0–12.0)
Neutro Abs: 5.5 10*3/uL (ref 1.4–7.7)
Neutrophils Relative %: 72.8 % (ref 43.0–77.0)
PLATELETS: 268 10*3/uL (ref 150.0–400.0)
RBC: 4.21 Mil/uL (ref 3.87–5.11)
RDW: 16 % — ABNORMAL HIGH (ref 11.5–15.5)
WBC: 7.6 10*3/uL (ref 4.0–10.5)

## 2017-06-14 LAB — BASIC METABOLIC PANEL
BUN: 13 mg/dL (ref 6–23)
CALCIUM: 9.8 mg/dL (ref 8.4–10.5)
CO2: 29 mEq/L (ref 19–32)
CREATININE: 0.86 mg/dL (ref 0.40–1.20)
Chloride: 104 mEq/L (ref 96–112)
GFR: 64.92 mL/min (ref >60.00)
Glucose, Bld: 103 mg/dL — ABNORMAL HIGH (ref 70–99)
Potassium: 4.5 mEq/L (ref 3.5–5.1)
Sodium: 141 mEq/L (ref 135–145)

## 2017-06-14 LAB — HEPATIC FUNCTION PANEL
ALBUMIN: 3.9 g/dL (ref 3.5–5.2)
ALK PHOS: 113 U/L (ref 39–117)
ALT: 11 U/L (ref 0–35)
AST: 13 U/L (ref 0–37)
Bilirubin, Direct: 0.2 mg/dL (ref 0.0–0.3)
TOTAL PROTEIN: 7.1 g/dL (ref 6.0–8.3)
Total Bilirubin: 0.8 mg/dL (ref 0.2–1.2)

## 2017-06-14 LAB — TSH: TSH: 1.61 u[IU]/mL (ref 0.35–4.50)

## 2017-06-14 LAB — LIPID PANEL
CHOLESTEROL: 197 mg/dL (ref 0–200)
HDL: 66.1 mg/dL (ref >39.00)
LDL Cholesterol: 110 mg/dL — ABNORMAL HIGH (ref 0–99)
NonHDL: 130.87
Total CHOL/HDL Ratio: 3
Triglycerides: 103 mg/dL (ref 0.0–149.0)
VLDL: 20.6 mg/dL (ref 0.0–40.0)

## 2017-06-14 LAB — T4, FREE: FREE T4: 0.82 ng/dL (ref 0.60–1.60)

## 2017-06-14 LAB — VITAMIN B12: Vitamin B-12: 333 pg/mL (ref 211–911)

## 2017-06-14 NOTE — Assessment & Plan Note (Signed)
She is s/p TAVR 11/2016 and a pacemaker placement. Card ref

## 2017-06-14 NOTE — Assessment & Plan Note (Addendum)
On [facility] Endocr ref

## 2017-06-14 NOTE — Progress Notes (Signed)
Subjective:  Patient ID: [name], female    DOB: 1919/01/09  Age: 83 y.o. MRN: [id number]  CC: No chief complaint on file.   HPI [name] presents for a new pt visit - not seen in >3 years. She was dx'd w/Graves disease. She is s/p TAVR 11/2016 and a pacemaker placement. She is s/p L THR after a fall... C/o L knee pain. The pt is at [employer]... C/o tingling  Outpatient Medications Prior to Visit  Medication Sig Dispense Refill  . acetaminophen (TYLENOL) 500 MG tablet Take 500 mg by mouth every 6 (six) hours as needed. For pain    . ALPRAZolam (XANAX) 0.25 MG tablet Take 0.25 mg by mouth daily as needed for anxiety.    Marland Kitchen apixaban (ELIQUIS) 2.5 MG TABS tablet Take 2.5 mg by mouth 2 (two) times daily.    . Cholecalciferol (VITAMIN D3) 2000 units capsule Take 2,000 Units by mouth daily.     Marland Kitchen losartan (COZAAR) 50 MG tablet Take 50 mg by mouth daily.    . methimazole (TAPAZOLE) 10 MG tablet Take 10 mg by mouth 2 (two) times daily.    . nitroGLYCERIN (NITROSTAT) 0.4 MG SL tablet Place 0.4 mg under the tongue every 5 (five) minutes as needed. Total of 3 doses in 15 minutes.     Marland Kitchen omeprazole (PRILOSEC) 20 MG capsule Take 20 mg by mouth daily.    . ondansetron (ZOFRAN-ODT) 4 MG disintegrating tablet Take 4 mg by mouth every 8 (eight) hours as needed for nausea or vomiting.    . pravastatin (PRAVACHOL) 20 MG tablet Take 20 mg by mouth daily.    Marland Kitchen aspirin 81 MG EC tablet Take 81 mg by mouth daily.      Marland Kitchen diltiazem (CARDIZEM CD) 120 MG 24 hr capsule TAKE ONE (1) CAPSULE(S) TWICE DAILY 60 capsule 5  . diltiazem (CARTIA XT) 120 MG 24 hr capsule Take 1 capsule (120 mg total) by mouth 2 (two) times daily. 60 capsule 11  . metoprolol succinate (TOPROL-XL) 50 MG 24 hr tablet Take 25 mg by mouth daily.    . pantoprazole (PROTONIX) 40 MG tablet TAKE ONE (1) TABLET(S) EVERY MORNING FOR INDIGESTION 90 tablet 2  . rosuvastatin (CRESTOR) 10 MG tablet Take 10 mg by mouth daily. Take one one  day. Skip two days and then repeat over and over.     . triamterene-hydrochlorothiazide (DYAZIDE) 37.5-25 MG per capsule TAKE ONE (1) CAPSULE(S) BY MOUTH ONCE A DAY 90 capsule 1  . warfarin (COUMADIN) 5 MG tablet Take 5 mg by mouth See admin instructions. Tuesday, Thursday, and Saturday.    . warfarin (COUMADIN) 5 MG tablet Take 2.5 mg by mouth See admin instructions. Monday, Wednesday, and Friday.     No facility-administered medications prior to visit.     ROS Review of Systems  Constitutional: Positive for fatigue. Negative for activity change, appetite change, chills and unexpected weight change.  HENT: Negative for congestion, mouth sores and sinus pressure.   Eyes: Negative for visual disturbance.  Respiratory: Negative for cough and chest tightness.   Gastrointestinal: Negative for abdominal pain and nausea.  Genitourinary: Negative for difficulty urinating, frequency and vaginal pain.  Musculoskeletal: Positive for arthralgias and gait problem. Negative for back pain.  Skin: Negative for pallor and rash.  Neurological: Negative for dizziness, tremors, weakness, numbness and headaches.  Psychiatric/Behavioral: Positive for decreased concentration. Negative for confusion and sleep disturbance.    Objective:  There were no vitals taken  for this visit.  BP Readings from Last 3 Encounters:  02/03/12 130/60  01/08/12 139/89  11/25/11 130/90    Wt Readings from Last 3 Encounters:  02/03/12 134 lb (60.8 kg)  11/25/11 135 lb (61.2 kg)  08/25/11 140 lb (63.5 kg)    Physical Exam  Constitutional: She appears well-developed. No distress.  HENT:  Head: Normocephalic.  Right Ear: External ear normal.  Left Ear: External ear normal.  Nose: Nose normal.  Mouth/Throat: Oropharynx is clear and moist.  Eyes: Conjunctivae are normal. Pupils are equal, round, and reactive to light. Right eye exhibits no discharge. Left eye exhibits no discharge.  Neck: Normal range of motion. Neck  supple. No JVD present. No tracheal deviation present. No thyromegaly present.  Cardiovascular: Normal rate and regular rhythm.  Murmur heard. Pulmonary/Chest: No stridor. No respiratory distress. She has no wheezes.  Abdominal: Soft. Bowel sounds are normal. She exhibits no distension and no mass. There is no tenderness. There is no rebound and no guarding.  Musculoskeletal: She exhibits no edema or tenderness.  Lymphadenopathy:    She has no cervical adenopathy.  Neurological: She displays normal reflexes. No cranial nerve deficit. She exhibits normal muscle tone. Coordination abnormal.  Skin: No rash noted. No erythema.  Psychiatric: She has a normal mood and affect. Her behavior is normal. Judgment and thought content normal.  left hip is tender a little w/ROM In NSR A walker Alert cooperative  "June 14, 1915" 100-7=93  93-7=?   Lab Results  Component Value Date   WBC 9.8 01/08/2012   HGB 15.3 (H) 01/08/2012   HCT 45.0 01/08/2012   PLT 336 01/08/2012   GLUCOSE 149 (H) 01/08/2012   CHOL 203 (H) 04/16/2011   TRIG 197.0 (H) 04/16/2011   HDL 50.00 04/16/2011   LDLDIRECT 123.0 04/16/2011   LDLCALC 103 (H) 08/25/2010   ALT 10 01/08/2012   AST 13 01/08/2012   NA 138 01/08/2012   K 3.9 01/08/2012   CL 94 (L) 01/08/2012   CREATININE 1.00 01/08/2012   BUN 11 01/08/2012   CO2 31 08/24/2011   TSH 0.94 04/16/2011   INR 2.84 (H) 01/08/2012   HGBA1C 7.1 (H) 05/27/2010    Dg Abd Acute W/chest  Result Date: 01/08/2012 *RADIOLOGY REPORT* Clinical Data: Abdominal pain, nausea and vomiting; abdominal distension.  History of bowel obstruction. ACUTE ABDOMEN SERIES (ABDOMEN 2 VIEW & CHEST 1 VIEW) Comparison: Abdominal radiograph performed 06/10/2010, and chest radiograph performed 09/12/2010 Findings: The lungs are well-aerated.  There is mild elevation of the left hemidiaphragm, with mild associated atelectasis.  Mild peribronchial thickening is noted.  There is no evidence of  pleural effusion or pneumothorax.  The cardiomediastinal silhouette is borderline normal in size. The visualized bowel gas pattern is nonspecific.  Scattered air and fluid-filled loops of small and large bowel are seen.  There is no evidence of significant bowel dilatation to suggest obstruction. No free intra-abdominal air is identified on the provided upright view.  Scattered clips are noted about the abdomen and pelvis. No acute osseous abnormalities are seen; the sacroiliac joints are unremarkable in appearance.  Mild degenerative change is noted at the lower lumbar spine. IMPRESSION: 1.  Nonspecific bowel gas pattern; no free intra-abdominal air seen. 2.  Mild left basilar atelectasis; mild peribronchial thickening. Original Report Authenticated By: Santa Lighter, M.D.   Assessment & Plan:   There are no diagnoses linked to this encounter. I have discontinued [name]'s aspirin, warfarin, rosuvastatin, diltiazem, triamterene-hydrochlorothiazide, metoprolol succinate, warfarin,  pantoprazole, and diltiazem. I am also having her maintain her nitroGLYCERIN, Vitamin D3, acetaminophen, ALPRAZolam, omeprazole, apixaban, pravastatin, methimazole, ondansetron, and losartan.  No orders of the defined types were placed in this encounter.    Follow-up: No Follow-up on file.  Walker Kehr, MD

## 2017-06-14 NOTE — Assessment & Plan Note (Signed)
Labs Consider Rx

## 2017-06-14 NOTE — Assessment & Plan Note (Signed)
On Pravachol 

## 2017-06-14 NOTE — Assessment & Plan Note (Signed)
Diltiazem

## 2017-06-14 NOTE — Assessment & Plan Note (Addendum)
She is s/p TAVR 11/2016 and a pacemaker placement. On Eliquis, off Coumadin

## 2017-06-22 ENCOUNTER — Telehealth: Payer: Self-pay | Admitting: Internal Medicine

## 2017-06-22 NOTE — Telephone Encounter (Signed)
[name], All labs are stable, except for a slight anemia. Will re-check next time. Thanks, AP

## 2017-06-22 NOTE — Telephone Encounter (Signed)
Please advise about lab results

## 2017-06-22 NOTE — Telephone Encounter (Signed)
Copied from [place]. Topic: Quick Communication - Lab Results >> Jun 22, 2017  9:41 AM Robina Ade, Helene Kelp D wrote: Patient daughter called wanting her mothers lab results from last Monday. Please call back.

## 2017-06-23 NOTE — Telephone Encounter (Signed)
Pts daughter called checking on these labs. She can be reached at [phone].

## 2017-06-24 NOTE — Telephone Encounter (Signed)
Daughter notified 

## 2017-06-29 DIAGNOSIS — M1711 Unilateral primary osteoarthritis, right knee: Secondary | ICD-10-CM | POA: Diagnosis not present

## 2017-06-29 DIAGNOSIS — Z96642 Presence of left artificial hip joint: Secondary | ICD-10-CM | POA: Diagnosis not present

## 2017-06-30 ENCOUNTER — Encounter: Payer: Self-pay | Admitting: *Deleted

## 2017-07-05 ENCOUNTER — Encounter: Payer: Self-pay | Admitting: Internal Medicine

## 2017-07-05 ENCOUNTER — Ambulatory Visit (INDEPENDENT_AMBULATORY_CARE_PROVIDER_SITE_OTHER): Payer: Medicare Other | Admitting: Internal Medicine

## 2017-07-05 VITALS — BP 112/64 | HR 84 | Ht 63.0 in | Wt 112.6 lb

## 2017-07-05 DIAGNOSIS — I442 Atrioventricular block, complete: Secondary | ICD-10-CM | POA: Diagnosis not present

## 2017-07-05 DIAGNOSIS — Z952 Presence of prosthetic heart valve: Secondary | ICD-10-CM | POA: Diagnosis not present

## 2017-07-05 NOTE — Progress Notes (Signed)
Plotnikov, Evie Lacks, MD:  [name] is a 83 y.o. female with a h/o complete heart block after TAVR sp PPM (MDT) by at [facility] who presents today to establish care in the Electrophysiology device clinic.   The patient reports doing reasonably well since having a pacemaker implanted and remains very active despite her age.  She had TAVR 10/2016 with subsquent AV block for which she underwent PPM 11/17/16.  She subsequently was diagnosed with Graves disease.  She also fell and had hip fx requiring surgery 8/18.  She has since moved back to [place] (where she was born and raised) and now lives at [facility].   Today, she  denies symptoms of palpitations, chest pain, shortness of breath, orthopnea, PND, lower extremity edema, dizziness, presyncope, syncope, or neurologic sequela.  The patientis tolerating medications without difficulties and is otherwise without complaint today.   Past Medical History:  Diagnosis Date  . Anxiety   . Aortic stenosis    s/p TAVR  . Arthritis   . Atrial fibrillation ([place]) 2011  . Atrioventricular block, complete ([place])    Pacemaker insertion 11/17/16, Medtronic  . COPD (chronic obstructive pulmonary disease) ([place])   . Diverticulosis of colon   . GERD (gastroesophageal reflux disease)    Dr. Earlean Shawl  . Hx of adenomatous colonic polyps   . Hyperlipemia   . Hypertension   . Mesenteric mass    2 cm  . Peripheral vascular disease, unspecified ([place])   . PVD (peripheral vascular disease) ([facility])    Dr. Elisabeth Cara  . SBO (small bowel obstruction) ([facility])     due to adhesions  . Stricture and stenosis of esophagus   . Thyroid disease   . Unspecified peritonitis   . Unspecified vitamin D deficiency    Past Surgical History:  Procedure Laterality Date  . ABDOMINAL ADHESION SURGERY    . APPENDECTOMY  1997   ruptured  . CARDIAC VALVE REPLACEMENT     TAVR  . CATARACT EXTRACTION, BILATERAL Bilateral 2016  . CHOLECYSTECTOMY    . COLOSTOMY  CLOSURE  1997   with end-to-end coloproctostomy anstomosis. ruptured  . CORONARY ANGIOPLASTY WITH STENT PLACEMENT  2018   3 stents  . HIP SURGERY    . OMENTECTOMY     Partial with gastrostomy  . PACEMAKER IMPLANT  11/17/2016   implanted at [facility],  Medtronic [id number] Azure XT SR MRI, WN:[id number], Lead: RV Medtronic [id number]. DX Atrioventricular Block/ I44.2  . SIGMOIDECTOMY     Segemental with colostomy and Hartmann    Social History   Socioeconomic History  . Marital status: Widowed    Spouse name: Not on file  . Number of children: Not on file  . Years of education: Not on file  . Highest education level: Not on file  Social Needs  . Financial resource strain: Not on file  . Food insecurity - worry: Not on file  . Food insecurity - inability: Not on file  . Transportation needs - medical: Not on file  . Transportation needs - non-medical: Not on file  Occupational History  . Occupation: Retired    Fish farm manager: RETIRED  Tobacco Use  . Smoking status: Former Smoker    Last attempt to quit: 08/01/1963    Years since quitting: 53.9  . Smokeless tobacco: Never Used  Substance and Sexual Activity  . Alcohol use: No  . Drug use: No  . Sexual activity: No  Other Topics Concern  . Not  on file  Social History Narrative   GYN-Dr. Theda Sers      Regular exercise-yes at [employer]    Family History  Problem Relation Age of Onset  . Coronary artery disease Father   . Early death Father   . Hypertension Father   . Kidney disease Father   . Mental illness Mother   . Miscarriages / [place] Mother   . Stroke Mother   . Hypertension Other   . Colon cancer Neg Hx     Allergies  Allergen Reactions  . Benicar [Olmesartan Medoxomil]     hyperkalemia  . Ezetimibe-Simvastatin     REACTION: ACHES  . Hydrochlorothiazide     REACTION: ? rash  . Lisinopril     REACTION: cough  . Promethazine Hcl     REACTION: palpitations    Current Outpatient Medications  Medication Sig  Dispense Refill  . acetaminophen (TYLENOL) 500 MG tablet Take 500 mg by mouth every 6 (six) hours as needed. For pain    . ALPRAZolam (XANAX) 0.25 MG tablet Take 0.25 mg by mouth daily as needed for anxiety.    Marland Kitchen apixaban (ELIQUIS) 2.5 MG TABS tablet Take 2.5 mg by mouth 2 (two) times daily.    . Cholecalciferol (VITAMIN D3) 2000 units capsule Take 2,000 Units by mouth daily.     Marland Kitchen losartan (COZAAR) 50 MG tablet Take 25 mg by mouth 2 (two) times daily.     . methimazole (TAPAZOLE) 10 MG tablet Take 10 mg by mouth 2 (two) times daily.    . nitroGLYCERIN (NITROSTAT) 0.4 MG SL tablet Place 0.4 mg under the tongue every 5 (five) minutes as needed. Total of 3 doses in 15 minutes.     Marland Kitchen omeprazole (PRILOSEC) 20 MG capsule Take 20 mg by mouth daily.    . ondansetron (ZOFRAN-ODT) 4 MG disintegrating tablet Take 4 mg by mouth every 8 (eight) hours as needed for nausea or vomiting.    . pravastatin (PRAVACHOL) 20 MG tablet Take 20 mg by mouth daily.     No current facility-administered medications for this visit.     ROS- all systems are reviewed and negative except as per HPI  Physical Exam: Vitals:   07/05/17 1456  BP: 112/64  Pulse: 84  SpO2: 99%  Weight: 112 lb 9.6 oz (51.1 kg)  Height: 5\' 3"  (1.6 m)    GEN- The patient is elderly and frail appearing, alert and oriented x 3 today.   Head- normocephalic, atraumatic Eyes-  Sclera clear, conjunctiva pink Ears- hearing intact Oropharynx- clear Neck- supple,   Lungs- Clear to ausculation bilaterally, normal work of breathing Chest- pacemaker pocket is well healed Heart- Regular rate and rhythm (paced)  GI- soft, NT, ND, + BS Extremities- no clubbing, cyanosis, or edema MS- diffuse muscle atrophy Skin- no rash or lesion Psych- euthymic mood, full affect Neuro- strength and sensation are intact  Pacemaker interrogation- reviewed in detail today,  See PACEART report  Assessment and Plan:  1. Complete heart block Normal pacemaker  function See Pace Art report No changes today  2. Chronic afib On eliquis Denies bleeding risks  3. AS s/p TAVR Will refer to general cardiology for long term management  [facility] Return to see EP NP in a year  Thompson Grayer MD, [facility] 07/05/2017 3:13 PM

## 2017-07-05 NOTE — Patient Instructions (Addendum)
Medication Instructions:  Your physician recommends that you continue on your current medications as directed. Please refer to the Current Medication list given to you today.   Labwork: None ordered   Testing/Procedures: None ordered   Follow-Up: Your physician wants you to follow-up in: 12 months with Chanetta Marshall, NP.  You will receive a reminder letter in the mail two months in advance. If you don't receive a letter, please call our office to schedule the follow-up appointment.  Remote monitoring is used to monitor your Pacemaker from home. This monitoring reduces the number of office visits required to check your device to one time per year. It allows [place] to keep an eye on the functioning of your device to ensure it is working properly. You are scheduled for a device check from home on 10/04/17. You may send your transmission at any time that day. If you have a wireless device, the transmission will be sent automatically. After your physician reviews your transmission, you will receive a postcard with your next transmission date.    You have been referred to Dr. Meda Coffee     If you need a refill on your cardiac medications before your next appointment, please call your pharmacy.

## 2017-07-06 LAB — CUP PACEART INCLINIC DEVICE CHECK
Battery Remaining Longevity: 166 mo
Battery Voltage: 3.15 V
Implantable Lead Implant Date: 20180501
Implantable Lead Location: 753860
Implantable Pulse Generator Implant Date: 20180501
Lead Channel Pacing Threshold Amplitude: 0.625 V
Lead Channel Pacing Threshold Pulse Width: 0.4 ms
Lead Channel Setting Pacing Pulse Width: 0.4 ms
Lead Channel Setting Sensing Sensitivity: 1.2 mV
MDC IDC MSMT LEADCHNL RV IMPEDANCE VALUE: 418 Ohm
MDC IDC MSMT LEADCHNL RV IMPEDANCE VALUE: 722 Ohm
MDC IDC MSMT LEADCHNL RV SENSING INTR AMPL: 11.75 mV
MDC IDC MSMT LEADCHNL RV SENSING INTR AMPL: 7.875 mV
MDC IDC SESS DTM: 20181217200537
MDC IDC SET LEADCHNL RV PACING AMPLITUDE: 2 V
MDC IDC STAT BRADY RV PERCENT PACED: 95.05 %

## 2017-07-15 ENCOUNTER — Other Ambulatory Visit: Payer: Self-pay | Admitting: Internal Medicine

## 2017-07-15 NOTE — Telephone Encounter (Signed)
meds have not been filled by you, okay to fill?

## 2017-07-26 ENCOUNTER — Encounter: Payer: Self-pay | Admitting: Endocrinology

## 2017-07-26 ENCOUNTER — Ambulatory Visit (INDEPENDENT_AMBULATORY_CARE_PROVIDER_SITE_OTHER): Payer: Medicare Other | Admitting: Endocrinology

## 2017-07-26 VITALS — BP 132/62 | HR 71 | Wt 112.4 lb

## 2017-07-26 DIAGNOSIS — E0590 Thyrotoxicosis, unspecified without thyrotoxic crisis or storm: Secondary | ICD-10-CM | POA: Diagnosis not present

## 2017-07-26 LAB — T4, FREE: Free T4: 0.76 ng/dL (ref 0.60–1.60)

## 2017-07-26 LAB — TSH: TSH: 3.5 u[IU]/mL (ref 0.35–4.50)

## 2017-07-26 NOTE — Patient Instructions (Signed)
blood tests are requested for you today.  We'll let you know about the results. If ever you have fever while taking methimazole, stop it and call us, even if the reason is obvious, because of the risk of a rare side-effect.  Please come back for a follow-up appointment in 3 months.   

## 2017-07-26 NOTE — Progress Notes (Signed)
Subjective:    Patient ID: [name], female    DOB: 02-17-1919, 84 y.o.   MRN: [id number]  HPI Pt is referred by Dr Alain Marion, for hyperthyroidism.  Pt reports he was dx'ed with hyperthyroidism in [place], in [place].  she has been on tapazole since dx.  she has never had XRT to the anterior neck, or thyroid surgery.  she has never had thyroid imaging.  she does not consume kelp or any other non-prescribed thyroid medication.  she has never been on amiodarone.  dtr provides hx, due to pt's memory loss.  She has slight diffuse arthralgias.  assoc weight loss has leveled off.   Past Medical History:  Diagnosis Date  . Anxiety   . Aortic stenosis    s/p TAVR  . Arthritis   . Atrial fibrillation ([place]) 2011  . Atrioventricular block, complete ([place])    Pacemaker insertion 11/17/16, Medtronic  . COPD (chronic obstructive pulmonary disease) ([place])   . Diverticulosis of colon   . GERD (gastroesophageal reflux disease)    Dr. Earlean Shawl  . Hx of adenomatous colonic polyps   . Hyperlipemia   . Hypertension   . Mesenteric mass    2 cm  . Peripheral vascular disease, unspecified ([place])   . PVD (peripheral vascular disease) ([facility])    Dr. Elisabeth Cara  . SBO (small bowel obstruction) ([facility])     due to adhesions  . Stricture and stenosis of esophagus   . Thyroid disease   . Unspecified peritonitis   . Unspecified vitamin D deficiency     Past Surgical History:  Procedure Laterality Date  . ABDOMINAL ADHESION SURGERY    . APPENDECTOMY  1997   ruptured  . CARDIAC VALVE REPLACEMENT     TAVR  . CATARACT EXTRACTION, BILATERAL Bilateral 2016  . CHOLECYSTECTOMY    . COLOSTOMY CLOSURE  1997   with end-to-end coloproctostomy anstomosis. ruptured  . CORONARY ANGIOPLASTY WITH STENT PLACEMENT  2018   3 stents  . HIP SURGERY    . OMENTECTOMY     Partial with gastrostomy  . PACEMAKER IMPLANT  11/17/2016   implanted at [facility],  Medtronic [id number] Azure XT SR MRI, WN:[id number] H, Lead: RV  Medtronic [id number]. DX Atrioventricular Block/ I44.2  . SIGMOIDECTOMY     Segemental with colostomy and Hartmann    Social History   Socioeconomic History  . Marital status: Widowed    Spouse name: Not on file  . Number of children: Not on file  . Years of education: Not on file  . Highest education level: Not on file  Social Needs  . Financial resource strain: Not on file  . Food insecurity - worry: Not on file  . Food insecurity - inability: Not on file  . Transportation needs - medical: Not on file  . Transportation needs - non-medical: Not on file  Occupational History  . Occupation: Retired    Fish farm manager: RETIRED  Tobacco Use  . Smoking status: Former Smoker    Last attempt to quit: 08/01/1963    Years since quitting: 54.0  . Smokeless tobacco: Never Used  Substance and Sexual Activity  . Alcohol use: No  . Drug use: No  . Sexual activity: No  Other Topics Concern  . Not on file  Social History Narrative   GYN-Dr. Theda Sers      Regular exercise-yes at [employer]    Current Outpatient Medications on File Prior to Visit  Medication Sig Dispense Refill  . acetaminophen (TYLENOL) 500 MG  tablet Take 500 mg by mouth every 6 (six) hours as needed. For pain    . ALPRAZolam (XANAX) 0.25 MG tablet Take 0.25 mg by mouth daily as needed for anxiety.    . Cholecalciferol (VITAMIN D3) 2000 units capsule Take 2,000 Units by mouth daily.     Marland Kitchen ELIQUIS 2.5 MG TABS tablet take 1 tablet by mouth twice a day 180 tablet 3  . losartan (COZAAR) 50 MG tablet take 1 tablet by mouth once daily 90 tablet 3  . methimazole (TAPAZOLE) 10 MG tablet Take 10 mg by mouth 2 (two) times daily.    . nitroGLYCERIN (NITROSTAT) 0.4 MG SL tablet Place 0.4 mg under the tongue every 5 (five) minutes as needed. Total of 3 doses in 15 minutes.     Marland Kitchen omeprazole (PRILOSEC) 20 MG capsule Take 20 mg by mouth daily.    . ondansetron (ZOFRAN-ODT) 4 MG disintegrating tablet Take 4 mg by mouth every 8 (eight) hours as  needed for nausea or vomiting.    . pravastatin (PRAVACHOL) 20 MG tablet Take 20 mg by mouth daily.     No current facility-administered medications on file prior to visit.     Allergies  Allergen Reactions  . Benicar [Olmesartan Medoxomil]     hyperkalemia  . Ezetimibe-Simvastatin     REACTION: ACHES  . Hydrochlorothiazide     REACTION: ? rash  . Lisinopril     REACTION: cough  . Promethazine Hcl     REACTION: palpitations    Family History  Problem Relation Age of Onset  . Coronary artery disease Father   . Early death Father   . Hypertension Father   . Kidney disease Father   . Hypothyroidism Daughter   . Mental illness Mother   . Miscarriages / [place] Mother   . Stroke Mother   . Hypertension Other   . Colon cancer Neg Hx     BP 132/62 (BP Location: Left Arm, Patient Position: Sitting, Cuff Size: Normal)   Pulse 71   Wt 112 lb 6.4 oz (51 kg)   SpO2 98%   BMI 19.91 kg/m    Review of Systems denies fever, headache, hoarseness, visual loss, palpitations, sob, diarrhea, polyuria, muscle weakness, edema, excessive diaphoresis, tremor, and heat intolerance.  She has chronic hearing loss, anxiety, easy bruising, and rhinorrhea.       Objective:   Physical Exam VS: see vs page GEN: no distress HEAD: head: no deformity.  eyes: no periorbital swelling; slight bilat proptosis external nose and ears are normal.  mouth: no lesion seen.  NECK: thyroid is approx twice normal size, (R>L), but no palpable nodule.  CHEST WALL: no deformity LUNGS: clear to auscultation.  CV: reg rate and rhythm, no murmur.   ABD: abdomen is soft, nontender.  no hepatosplenomegaly.  not distended.  no hernia MUSCULOSKELETAL: muscle bulk and strength are grossly normal.  no obvious joint swelling.  gait is normal and steady EXTEMITIES: no leg edema.   PULSES: dorsalis pedis intact bilat.  no carotid bruit.   NEURO:  cn 2-12 grossly intact.   readily moves all 4's.  sensation is  intact to touch on all 4's.  No tremor.   SKIN:  Normal texture and temperature.  No rash or suspicious lesion is visible.  Not diaphoretic.  NODES:  None palpable at the neck.  PSYCH: alert, well-oriented.  Does not appear anxious nor depressed.  Records are requested from [place].  Assessment & Plan:  Hyperthyroidism, prob due to Grave's Dz, new to me.   Patient Instructions  blood tests are requested for you today.  We'll let you know about the results. If ever you have fever while taking methimazole, stop it and call us, even if the reason is obvious, because of the risk of a rare side-effect.  Please come back for a follow-up appointment in 3 months.

## 2017-08-16 ENCOUNTER — Ambulatory Visit (INDEPENDENT_AMBULATORY_CARE_PROVIDER_SITE_OTHER): Payer: Medicare Other | Admitting: Internal Medicine

## 2017-08-16 ENCOUNTER — Encounter: Payer: Self-pay | Admitting: Internal Medicine

## 2017-08-16 DIAGNOSIS — H9193 Unspecified hearing loss, bilateral: Secondary | ICD-10-CM

## 2017-08-16 DIAGNOSIS — F0390 Unspecified dementia without behavioral disturbance: Secondary | ICD-10-CM | POA: Insufficient documentation

## 2017-08-16 DIAGNOSIS — E0590 Thyrotoxicosis, unspecified without thyrotoxic crisis or storm: Secondary | ICD-10-CM

## 2017-08-16 DIAGNOSIS — E785 Hyperlipidemia, unspecified: Secondary | ICD-10-CM

## 2017-08-16 DIAGNOSIS — G301 Alzheimer's disease with late onset: Secondary | ICD-10-CM | POA: Diagnosis not present

## 2017-08-16 DIAGNOSIS — R634 Abnormal weight loss: Secondary | ICD-10-CM | POA: Diagnosis not present

## 2017-08-16 DIAGNOSIS — H9190 Unspecified hearing loss, unspecified ear: Secondary | ICD-10-CM | POA: Insufficient documentation

## 2017-08-16 DIAGNOSIS — F0280 Dementia in other diseases classified elsewhere without behavioral disturbance: Secondary | ICD-10-CM

## 2017-08-16 MED ORDER — B COMPLEX PO TABS: 1.0000 | ORAL_TABLET | Freq: Every day | ORAL | 3 refills | Status: DC

## 2017-08-16 MED ORDER — DONEPEZIL HCL 5 MG PO TABS: 5.0000 mg | ORAL_TABLET | Freq: Every day | ORAL | 5 refills | Status: AC

## 2017-08-16 NOTE — Assessment & Plan Note (Signed)
Start Aricept

## 2017-08-16 NOTE — Progress Notes (Signed)
Subjective:  Patient ID: [name], female    DOB: [birth year]  Age: 84 y.o. MRN: [id number]  CC: No chief complaint on file.   HPI Allyna L Robey presents for falls, confusion, GERD, hypothyroidism follow-up  Outpatient Medications Prior to Visit  Medication Sig Dispense Refill  . acetaminophen (TYLENOL) 500 MG tablet Take 500 mg by mouth every 6 (six) hours as needed. For pain    . ALPRAZolam (XANAX) 0.25 MG tablet Take 0.25 mg by mouth daily as needed for anxiety.    . Cholecalciferol (VITAMIN D3) 2000 units capsule Take 2,000 Units by mouth daily.     Marland Kitchen ELIQUIS 2.5 MG TABS tablet take 1 tablet by mouth twice a day 180 tablet 3  . losartan (COZAAR) 50 MG tablet take 1 tablet by mouth once daily 90 tablet 3  . methimazole (TAPAZOLE) 10 MG tablet Take 10 mg by mouth 2 (two) times daily.    . nitroGLYCERIN (NITROSTAT) 0.4 MG SL tablet Place 0.4 mg under the tongue every 5 (five) minutes as needed. Total of 3 doses in 15 minutes.     Marland Kitchen omeprazole (PRILOSEC) 20 MG capsule Take 20 mg by mouth daily.    . ondansetron (ZOFRAN-ODT) 4 MG disintegrating tablet Take 4 mg by mouth every 8 (eight) hours as needed for nausea or vomiting.    . pravastatin (PRAVACHOL) 20 MG tablet Take 20 mg by mouth daily.     No facility-administered medications prior to visit.     ROS Review of Systems  Constitutional: Negative for activity change, appetite change, chills, fatigue and unexpected weight change.  HENT: Positive for hearing loss. Negative for congestion, mouth sores and sinus pressure.   Eyes: Negative for visual disturbance.  Respiratory: Negative for cough and chest tightness.   Gastrointestinal: Negative for abdominal pain and nausea.  Genitourinary: Negative for difficulty urinating, frequency and vaginal pain.  Musculoskeletal: Negative for back pain and gait problem.  Skin: Negative for pallor and rash.  Neurological: Negative for dizziness, tremors, weakness, numbness and  headaches.  Psychiatric/Behavioral: Positive for confusion and decreased concentration. Negative for sleep disturbance. The patient is nervous/anxious.     Objective:  BP (!) 144/82 (BP Location: Left Arm, Patient Position: Sitting, Cuff Size: Normal)   Pulse 78   Temp 98.4 F (36.9 C) (Oral)   Ht 5\' 3"  (1.6 m)   Wt 115 lb (52.2 kg)   SpO2 98%   BMI 20.37 kg/m   BP Readings from Last 3 Encounters:  08/16/17 (!) 144/82  07/26/17 132/62  07/05/17 112/64    Wt Readings from Last 3 Encounters:  08/16/17 115 lb (52.2 kg)  07/26/17 112 lb 6.4 oz (51 kg)  07/05/17 112 lb 9.6 oz (51.1 kg)    Physical Exam  Constitutional: She appears well-developed. No distress.  HENT:  Head: Normocephalic.  Right Ear: External ear normal.  Left Ear: External ear normal.  Nose: Nose normal.  Mouth/Throat: Oropharynx is clear and moist.  Eyes: Conjunctivae are normal. Pupils are equal, round, and reactive to light. Right eye exhibits no discharge. Left eye exhibits no discharge.  Neck: Normal range of motion. Neck supple. No JVD present. No tracheal deviation present. No thyromegaly present.  Cardiovascular: Normal rate, regular rhythm and normal heart sounds.  Pulmonary/Chest: No stridor. No respiratory distress. She has no wheezes.  Abdominal: Soft. Bowel sounds are normal. She exhibits no distension and no mass. There is no tenderness. There is no rebound and no guarding.  Musculoskeletal:  She exhibits no edema or tenderness.  Lymphadenopathy:    She has no cervical adenopathy.  Neurological: She displays normal reflexes. No cranial nerve deficit. She exhibits normal muscle tone. Coordination normal.  Skin: No rash noted. No erythema.  Psychiatric: She has a normal mood and affect. Her behavior is normal. Judgment and thought content normal.    Lab Results  Component Value Date   WBC 7.6 06/14/2017   HGB 11.9 (L) 06/14/2017   HCT 36.8 06/14/2017   PLT 268.0 06/14/2017   GLUCOSE 103 (H)  06/14/2017   CHOL 197 06/14/2017   TRIG 103.0 06/14/2017   HDL 66.10 06/14/2017   LDLDIRECT 123.0 04/16/2011   LDLCALC 110 (H) 06/14/2017   ALT 11 06/14/2017   AST 13 06/14/2017   NA 141 06/14/2017   K 4.5 06/14/2017   CL 104 06/14/2017   CREATININE 0.86 06/14/2017   BUN 13 06/14/2017   CO2 29 06/14/2017   TSH 3.50 07/26/2017   INR 2.84 (H) 01/08/2012   HGBA1C 7.1 (H) 05/27/2010    Dg Abd Acute W/chest  Result Date: 01/08/2012 *RADIOLOGY REPORT* Clinical Data: Abdominal pain, nausea and vomiting; abdominal distension.  History of bowel obstruction. ACUTE ABDOMEN SERIES (ABDOMEN 2 VIEW & CHEST 1 VIEW) Comparison: Abdominal radiograph performed 06/10/2010, and chest radiograph performed 09/12/2010 Findings: The lungs are well-aerated.  There is mild elevation of the left hemidiaphragm, with mild associated atelectasis.  Mild peribronchial thickening is noted.  There is no evidence of pleural effusion or pneumothorax.  The cardiomediastinal silhouette is borderline normal in size. The visualized bowel gas pattern is nonspecific.  Scattered air and fluid-filled loops of small and large bowel are seen.  There is no evidence of significant bowel dilatation to suggest obstruction. No free intra-abdominal air is identified on the provided upright view.  Scattered clips are noted about the abdomen and pelvis. No acute osseous abnormalities are seen; the sacroiliac joints are unremarkable in appearance.  Mild degenerative change is noted at the lower lumbar spine. IMPRESSION: 1.  Nonspecific bowel gas pattern; no free intra-abdominal air seen. 2.  Mild left basilar atelectasis; mild peribronchial thickening. Original Report Authenticated By: Santa Lighter, M.D.   Assessment & Plan:   There are no diagnoses linked to this encounter. I am having Amira L. Bittel maintain her nitroGLYCERIN, Vitamin D3, acetaminophen, ALPRAZolam, omeprazole, pravastatin, methimazole, ondansetron, losartan, and  ELIQUIS.  No orders of the defined types were placed in this encounter.    Follow-up: No Follow-up on file.  Walker Kehr, MD

## 2017-08-16 NOTE — Assessment & Plan Note (Signed)
Will ref to ENT

## 2017-08-22 NOTE — Assessment & Plan Note (Signed)
Follow-up with endocrinology On Tapazole

## 2017-08-22 NOTE — Assessment & Plan Note (Signed)
On Pravachol 

## 2017-08-22 NOTE — Assessment & Plan Note (Signed)
Wt Readings from Last 3 Encounters:  08/16/17 115 lb (52.2 kg)  07/26/17 112 lb 6.4 oz (51 kg)  07/05/17 112 lb 9.6 oz (51.1 kg)

## 2017-08-26 ENCOUNTER — Ambulatory Visit: Payer: Self-pay | Admitting: *Deleted

## 2017-08-26 NOTE — Telephone Encounter (Signed)
fyi

## 2017-08-26 NOTE — Telephone Encounter (Signed)
appt   Made  tomorrow   With  Jodi Mourning   Daughter  Advised  To  Call  911  If   Change  In   Level of  Consciousness

## 2017-08-26 NOTE — Telephone Encounter (Signed)
Pt   Has  Hypertension    Her  bp  Was  Elevated  Earlier  This  Am    Am   Ems   Was  At the  Scene  Earlier this  Am  And  Did  Not  Transport  Her     Her  Bp   According  To  Daughter  Is  Higher   At  This  Time .  The  Pt  Is  A  Resident  Of   [facility]  The  Daughter  Also  Reports  She  Has  Some  Tingling   In her  Extremities.  The daughter  Was  Advised  That  She  Needed  [place]   She  Was  Advised to  Call  911     Reason for Disposition . [1] Numbness (i.e., loss of sensation) of the face, arm or leg on one side of the body AND [2] new onset  Answer Assessment - Initial Assessment Questions 1. BLOOD PRESSURE: "What is the blood pressure?" "Did you take at least two measurements 5 minutes apart?"    180/100   BY  NURSING   170 /100  By staff  At  [facility]     2. ONSET: "When did you take your blood pressure?"       15  mins     3. HOW: "How did you obtain the blood pressure?" (e.g., visiting nurse, automatic home BP monitor)      Taken  By  Nurse     4. HISTORY: "Do you have a history of high blood pressure?"        Pt has  A  History  Of  HTN 5. MEDICATIONS: "Are you taking any medications for blood pressure?" "Have you missed any doses recently?"     No  6. OTHER SYMPTOMS: "Do you have any symptoms?" (e.g., headache, chest pain, blurred vision, difficulty breathing, weakness)     Tingling  In  Fingers    In  Fingers  Has   Had  The  Tingling  Before   7. PREGNANCY: "Is there any chance you are pregnant?" "When was your last menstrual period?"     N/a  Protocols used: HIGH BLOOD PRESSURE-A-AH

## 2017-08-26 NOTE — Telephone Encounter (Signed)
Ems   Was   At  Scene  X  2  Today  bp  Was   Elevated   Now  Its  Better      Neurologically  Intact    Daughter  Is  At   [facility] care          Reason for Disposition . Systolic BP  >= 992 OR Diastolic >= 426  Answer Assessment - Initial Assessment Questions 1. BLOOD PRESSURE: "What is the blood pressure?" "Did you take at least two measurements 5 minutes apart?"    134/82  r  Arm    2. ONSET: "When did you take your blood pressure?"       45  mins  ago 3. HOW: "How did you obtain the blood pressure?" (e.g., visiting nurse, automatic home BP monitor)        Ems     4. HISTORY: "Do you have a history of high blood pressure?"          Yes   5. MEDICATIONS: "Are you taking any medications for blood pressure?" "Have you missed any doses recently?"     yes 6. OTHER SYMPTOMS: "Do you have any symptoms?" (e.g., headache, chest pain, blurred vision, difficulty breathing, weakness)       Recent  Weight  Gain    7. PREGNANCY: "Is there any chance you are pregnant?" "When was your last menstrual period?"         n/a  Protocols used: HIGH BLOOD PRESSURE-A-AH

## 2017-08-27 ENCOUNTER — Encounter: Payer: Self-pay | Admitting: Family

## 2017-08-27 ENCOUNTER — Ambulatory Visit (INDEPENDENT_AMBULATORY_CARE_PROVIDER_SITE_OTHER): Payer: Medicare Other | Admitting: Family

## 2017-08-27 VITALS — BP 146/82 | HR 60 | Temp 97.7°F | Ht 63.0 in | Wt 113.0 lb

## 2017-08-27 DIAGNOSIS — I10 Essential (primary) hypertension: Secondary | ICD-10-CM | POA: Diagnosis not present

## 2017-08-27 DIAGNOSIS — L309 Dermatitis, unspecified: Secondary | ICD-10-CM

## 2017-08-27 MED ORDER — LOSARTAN POTASSIUM 25 MG PO TABS: 25.0000 mg | ORAL_TABLET | Freq: Every day | ORAL | 2 refills | Status: DC

## 2017-08-27 NOTE — Telephone Encounter (Signed)
Noted.  Office visit with any provider if problems.  Thank you

## 2017-08-27 NOTE — Patient Instructions (Signed)
Keep planned follow-up with Dr. Alain Marion for next month;

## 2017-08-27 NOTE — Progress Notes (Signed)
[name] is a 84 y.o. female with the following history as recorded in EpicCare:  Patient Active Problem List   Diagnosis Date Noted  . Hearing loss 08/16/2017  . Dementia 08/16/2017  . Hyperthyroidism 06/14/2017  . Memory loss 06/14/2017  . Well adult exam 02/03/2012  . Fall against object 04/21/2011  . Hyperkalemia 12/03/2010  . Confusion 11/26/2010  . CORONARY ARTERY DISEASE 10/04/2010  . URI 09/12/2010  . ALLERGIC RHINITIS DUE TO OTHER ALLERGEN 09/12/2010  . ESOPHAGEAL STRICTURE 08/28/2010  . PERITONITIS 08/28/2010  . COLONIC POLYPS, ADENOMATOUS, HX OF 08/28/2010  . WEIGHT LOSS 07/09/2010  . DYSPHAGIA UNSPECIFIED 07/09/2010  . Abdominal pain, generalized 07/09/2010  . Aortic valve disorder 05/21/2010  . Atrial fibrillation ([place]) 05/21/2010  . HEART MURMUR, SYSTOLIC 81/85/6314  . ECCHYMOSES 04/08/2010  . ANXIETY 01/24/2010  . CHEST PAIN 01/24/2010  . TOBACCO USE, QUIT 05/07/2009  . CHEST WALL PAIN 04/17/2009  . CONTUSION OF FACE SCALP AND NECK EXCEPT EYE 04/17/2009  . DIZZINESS 03/11/2009  . CONJUNCTIVITIS 09/11/2008  . ORAL ULCER 09/11/2008  . RASH AND OTHER NONSPECIFIC SKIN ERUPTION 09/11/2008  . Pain in limb 11/02/2007  . PARESTHESIA 11/02/2007  . UNSPECIFIED VITAMIN D DEFICIENCY 08/29/2007  . NAUSEA ALONE 08/29/2007  . SBO 07/27/2007  . Dyslipidemia 02/26/2007  . Essential hypertension 02/26/2007  . PERIPHERAL VASCULAR DISEASE 02/26/2007  . COPD 02/26/2007  . GERD 02/26/2007  . DIVERTICULOSIS, COLON 02/26/2007  . ADNEXAL MASS 02/26/2007    Current Outpatient Medications  Medication Sig Dispense Refill  . acetaminophen (TYLENOL) 500 MG tablet Take 500 mg by mouth every 6 (six) hours as needed. For pain    . b complex vitamins tablet Take 1 tablet by mouth daily. 100 tablet 3  . Cholecalciferol (VITAMIN D3) 2000 units capsule Take 2,000 Units by mouth daily.     Marland Kitchen donepezil (ARICEPT) 5 MG tablet Take 1 tablet (5 mg total) by mouth at bedtime. 30  tablet 5  . ELIQUIS 2.5 MG TABS tablet take 1 tablet by mouth twice a day 180 tablet 3  . methimazole (TAPAZOLE) 10 MG tablet Take 10 mg by mouth 2 (two) times daily.    . nitroGLYCERIN (NITROSTAT) 0.4 MG SL tablet Place 0.4 mg under the tongue every 5 (five) minutes as needed. Total of 3 doses in 15 minutes.     Marland Kitchen omeprazole (PRILOSEC) 20 MG capsule Take 20 mg by mouth daily.    . ondansetron (ZOFRAN-ODT) 4 MG disintegrating tablet Take 4 mg by mouth every 8 (eight) hours as needed for nausea or vomiting.    . pravastatin (PRAVACHOL) 20 MG tablet Take 20 mg by mouth daily.    Marland Kitchen losartan (COZAAR) 25 MG tablet Take 1 tablet (25 mg total) by mouth daily. 60 tablet 2   No current facility-administered medications for this visit.     Allergies: Benicar [olmesartan medoxomil]; Ezetimibe-simvastatin; Hydrochlorothiazide; Lisinopril; and Promethazine hcl  Past Medical History:  Diagnosis Date  . Anxiety   . Aortic stenosis    s/p TAVR  . Arthritis   . Atrial fibrillation ([place]) 2011  . Atrioventricular block, complete ([place])    Pacemaker insertion 11/17/16, Medtronic  . COPD (chronic obstructive pulmonary disease) ([place])   . Diverticulosis of colon   . GERD (gastroesophageal reflux disease)    Dr. Earlean Shawl  . Hx of adenomatous colonic polyps   . Hyperlipemia   . Hypertension   . Mesenteric mass    2 cm  . Peripheral vascular disease, unspecified (  [place])   . PVD (peripheral vascular disease) ([place])    Dr. Elisabeth Cara  . SBO (small bowel obstruction) ([facility])     due to adhesions  . Stricture and stenosis of esophagus   . Thyroid disease   . Unspecified peritonitis   . Unspecified vitamin D deficiency     Past Surgical History:  Procedure Laterality Date  . ABDOMINAL ADHESION SURGERY    . APPENDECTOMY  1997   ruptured  . CARDIAC VALVE REPLACEMENT     TAVR  . CATARACT EXTRACTION, BILATERAL Bilateral 2016  . CHOLECYSTECTOMY    . COLOSTOMY CLOSURE  1997   with end-to-end coloproctostomy  anstomosis. ruptured  . CORONARY ANGIOPLASTY WITH STENT PLACEMENT  2018   3 stents  . HIP SURGERY    . OMENTECTOMY     Partial with gastrostomy  . PACEMAKER IMPLANT  11/17/2016   implanted at [facility],  Medtronic [id number] Azure XT SR MRI, KG:[id number], Lead: RV Medtronic [id number]. DX Atrioventricular Block/ I44.2  . SIGMOIDECTOMY     Segemental with colostomy and Hartmann    Family History  Problem Relation Age of Onset  . Coronary artery disease Father   . Early death Father   . Hypertension Father   . Kidney disease Father   . Hypothyroidism Daughter   . Mental illness Mother   . Miscarriages / [place] Mother   . Stroke Mother   . Hypertension Other   . Colon cancer Neg Hx     Social History   Tobacco Use  . Smoking status: Former Smoker    Last attempt to quit: 08/01/1963    Years since quitting: 54.1  . Smokeless tobacco: Never Used  Substance Use Topics  . Alcohol use: No    Subjective:  Patient lives at assisted living facility; EMS was called to the facility at 9 am with concerns about elevated blood pressure ( 180/100)- realized that at that time she had not taken her medicine; a few hours later, facility called again with elevated reading but quickly normalized; EMS was called a second time but blood pressure was normal and did not feel transport/ further follow up needed;  Per daughter, she is concerned that her mother's blood pressure medication is not written correctly and as a result she is not getting her blood pressure medication properly; Losartan should be 25 mg bid as opposed to 50 mg daily; daughter says that her mother actually took 25 mg of the Losartan twice yesterday and feels this is why her pressure is so well controlled today;  Patient denies any chest pain, shortness of breath, blurred vision or headache; Daughter and patient also both ask for referral to dermatology; patient has recently moved to [facility] from [place] and needs to get  established with new providers.    Objective:  Vitals:   08/27/17 0828  BP: (!) 146/82  Pulse: 60  Temp: 97.7 F (36.5 C)  TempSrc: Oral  SpO2: 98%  Weight: 113 lb 0.6 oz (51.3 kg)  Height: 5\' 3"  (1.6 m)    General: Well developed, well nourished, in no acute distress  Skin : Warm and dry.  Head: Normocephalic and atraumatic  Lungs: Respirations unlabored; clear to auscultation bilaterally without wheeze, rales, rhonchi  CVS exam: normal rate and regular rhythm.  Abdomen: Soft; nontender; nondistended; normoactive bowel sounds; no masses or hepatosplenomegaly  Musculoskeletal: No deformities; no active joint inflammation  Extremities: No edema, cyanosis, clubbing  Vessels: Symmetric bilaterally  Neurologic: Alert and oriented; speech intact;  face symmetrical; moves all extremities well; CNII-XII intact without focal deficit  Assessment:  1. Essential hypertension   2. Dermatitis     Plan:  1. Appears stable in office today; will change Losartan to 25 mg bid as requested per mother and daughter; keep planned follow-up with her PCP for March 2019; 2. Refer to dermatology as requested.   No Follow-up on file.  Orders Placed This Encounter  Procedures  . Ambulatory referral to Dermatology    Referral Priority:   Routine    Referral Type:   Consultation    Referral Reason:   Specialty Services Required    Requested Specialty:   Dermatology    Number of Visits Requested:   1    Requested Prescriptions   Signed Prescriptions Disp Refills  . losartan (COZAAR) 25 MG tablet 60 tablet 2    Sig: Take 1 tablet (25 mg total) by mouth daily.

## 2017-09-13 ENCOUNTER — Ambulatory Visit: Payer: Medicare Other | Admitting: Internal Medicine

## 2017-09-13 DIAGNOSIS — L570 Actinic keratosis: Secondary | ICD-10-CM | POA: Diagnosis not present

## 2017-09-13 DIAGNOSIS — D485 Neoplasm of uncertain behavior of skin: Secondary | ICD-10-CM | POA: Diagnosis not present

## 2017-09-13 DIAGNOSIS — C44729 Squamous cell carcinoma of skin of left lower limb, including hip: Secondary | ICD-10-CM | POA: Diagnosis not present

## 2017-09-16 ENCOUNTER — Other Ambulatory Visit: Payer: Self-pay | Admitting: Internal Medicine

## 2017-09-27 ENCOUNTER — Ambulatory Visit: Payer: Medicare Other | Admitting: Cardiology

## 2017-10-04 ENCOUNTER — Ambulatory Visit (INDEPENDENT_AMBULATORY_CARE_PROVIDER_SITE_OTHER): Payer: Medicare Other | Admitting: *Deleted

## 2017-10-04 ENCOUNTER — Encounter: Payer: Self-pay | Admitting: Internal Medicine

## 2017-10-04 ENCOUNTER — Telehealth: Payer: Self-pay | Admitting: Cardiology

## 2017-10-04 ENCOUNTER — Other Ambulatory Visit (INDEPENDENT_AMBULATORY_CARE_PROVIDER_SITE_OTHER): Payer: Medicare Other

## 2017-10-04 ENCOUNTER — Ambulatory Visit (INDEPENDENT_AMBULATORY_CARE_PROVIDER_SITE_OTHER): Payer: Medicare Other | Admitting: Internal Medicine

## 2017-10-04 DIAGNOSIS — R609 Edema, unspecified: Secondary | ICD-10-CM | POA: Insufficient documentation

## 2017-10-04 DIAGNOSIS — R600 Localized edema: Secondary | ICD-10-CM

## 2017-10-04 DIAGNOSIS — E0590 Thyrotoxicosis, unspecified without thyrotoxic crisis or storm: Secondary | ICD-10-CM

## 2017-10-04 DIAGNOSIS — Z85828 Personal history of other malignant neoplasm of skin: Secondary | ICD-10-CM | POA: Diagnosis not present

## 2017-10-04 DIAGNOSIS — R410 Disorientation, unspecified: Secondary | ICD-10-CM | POA: Diagnosis not present

## 2017-10-04 DIAGNOSIS — L821 Other seborrheic keratosis: Secondary | ICD-10-CM | POA: Diagnosis not present

## 2017-10-04 DIAGNOSIS — I442 Atrioventricular block, complete: Secondary | ICD-10-CM | POA: Diagnosis not present

## 2017-10-04 LAB — CBC WITH DIFFERENTIAL/PLATELET
BASOS ABS: 0.1 10*3/uL (ref 0.0–0.1)
Basophils Relative: 0.6 % (ref 0.0–3.0)
Eosinophils Absolute: 0.1 10*3/uL (ref 0.0–0.7)
Eosinophils Relative: 1 % (ref 0.0–5.0)
HCT: 38.6 % (ref 36.0–46.0)
Hemoglobin: 12.6 g/dL (ref 12.0–15.0)
LYMPHS ABS: 1.8 10*3/uL (ref 0.7–4.0)
Lymphocytes Relative: 19.8 % (ref 12.0–46.0)
MCHC: 32.7 g/dL (ref 30.0–36.0)
MCV: 84.3 fl (ref 78.0–100.0)
MONO ABS: 0.8 10*3/uL (ref 0.1–1.0)
MONOS PCT: 9.1 % (ref 3.0–12.0)
NEUTROS ABS: 6.3 10*3/uL (ref 1.4–7.7)
Neutrophils Relative %: 69.5 % (ref 43.0–77.0)
PLATELETS: 288 10*3/uL (ref 150.0–400.0)
RBC: 4.58 Mil/uL (ref 3.87–5.11)
RDW: 16.3 % — ABNORMAL HIGH (ref 11.5–15.5)
WBC: 9 10*3/uL (ref 4.0–10.5)

## 2017-10-04 LAB — HEPATIC FUNCTION PANEL
ALK PHOS: 108 U/L (ref 39–117)
ALT: 10 U/L (ref 0–35)
AST: 12 U/L (ref 0–37)
Albumin: 4 g/dL (ref 3.5–5.2)
BILIRUBIN TOTAL: 0.6 mg/dL (ref 0.2–1.2)
Bilirubin, Direct: 0.1 mg/dL (ref 0.0–0.3)
Total Protein: 7.3 g/dL (ref 6.0–8.3)

## 2017-10-04 LAB — BASIC METABOLIC PANEL
BUN: 17 mg/dL (ref 6–23)
CALCIUM: 9.6 mg/dL (ref 8.4–10.5)
CO2: 28 meq/L (ref 19–32)
CREATININE: 1.07 mg/dL (ref 0.40–1.20)
Chloride: 101 mEq/L (ref 96–112)
GFR: 50.42 mL/min — ABNORMAL LOW (ref >60.00)
GLUCOSE: 95 mg/dL (ref 70–99)
Potassium: 4 mEq/L (ref 3.5–5.1)
SODIUM: 137 meq/L (ref 135–145)

## 2017-10-04 LAB — URINALYSIS, ROUTINE W REFLEX MICROSCOPIC
BILIRUBIN URINE: NEGATIVE
Ketones, ur: NEGATIVE
Nitrite: NEGATIVE
Specific Gravity, Urine: 1.02 (ref 1.000–1.030)
TOTAL PROTEIN, URINE-UPE24: 100 — AB
Urine Glucose: NEGATIVE
Urobilinogen, UA: 0.2 (ref 0.0–1.0)
pH: 6 (ref 5.0–8.0)

## 2017-10-04 LAB — TSH: TSH: 28.41 u[IU]/mL — ABNORMAL HIGH (ref 0.35–4.50)

## 2017-10-04 LAB — BRAIN NATRIURETIC PEPTIDE: PRO B NATRI PEPTIDE: 313 pg/mL — AB (ref 0.0–100.0)

## 2017-10-04 MED ORDER — LEVOTHYROXINE SODIUM 25 MCG PO TABS: ORAL_TABLET | ORAL | 2 refills | Status: DC

## 2017-10-04 MED ORDER — CEFUROXIME AXETIL 250 MG PO TABS: 250.0000 mg | ORAL_TABLET | Freq: Two times a day (BID) | ORAL | 1 refills | Status: DC

## 2017-10-04 NOTE — Addendum Note (Signed)
Addended by: Cassandria Anger on: 10/04/2017 10:39 PM   Modules accepted: Orders

## 2017-10-04 NOTE — Assessment & Plan Note (Addendum)
Eyelids 3/19 Labs

## 2017-10-04 NOTE — Telephone Encounter (Signed)
Confirmed remote transmission w/ pt daughter.   

## 2017-10-04 NOTE — Patient Instructions (Signed)
Go to ER if worse 

## 2017-10-04 NOTE — Assessment & Plan Note (Addendum)
3/18 TSH 28 Hold Tapazole Levothroid 25 mcg/d RTC 2 weeks w/labs

## 2017-10-04 NOTE — Assessment & Plan Note (Addendum)
Acute on chronic R/o UTI -- check UA, labs CT head if (-) w/up To ER if issues  Empiric Ceftin to start now

## 2017-10-04 NOTE — Progress Notes (Signed)
Subjective:  Patient ID: [name], female    DOB: 10-15-1918  Age: 84 y.o. MRN: [id number]  CC: No chief complaint on file.   HPI [name] presents for worsened confusion x 2 d, paranoia. ?UTI  Outpatient Medications Prior to Visit  Medication Sig Dispense Refill  . acetaminophen (TYLENOL) 500 MG tablet Take 500 mg by mouth every 6 (six) hours as needed. For pain    . b complex vitamins tablet Take 1 tablet by mouth daily. 100 tablet 3  . Cholecalciferol (VITAMIN D3) 2000 units capsule Take 2,000 Units by mouth daily.     Marland Kitchen donepezil (ARICEPT) 5 MG tablet Take 1 tablet (5 mg total) by mouth at bedtime. 30 tablet 5  . ELIQUIS 2.5 MG TABS tablet take 1 tablet by mouth twice a day 180 tablet 3  . losartan (COZAAR) 25 MG tablet Take 1 tablet (25 mg total) by mouth daily. 60 tablet 2  . methimazole (TAPAZOLE) 10 MG tablet take 1 tablet by mouth twice a day 60 tablet 2  . nitroGLYCERIN (NITROSTAT) 0.4 MG SL tablet Place 0.4 mg under the tongue every 5 (five) minutes as needed. Total of 3 doses in 15 minutes.     Marland Kitchen omeprazole (PRILOSEC) 20 MG capsule Take 20 mg by mouth daily.    . ondansetron (ZOFRAN-ODT) 4 MG disintegrating tablet Take 4 mg by mouth every 8 (eight) hours as needed for nausea or vomiting.    . pravastatin (PRAVACHOL) 20 MG tablet Take 20 mg by mouth daily.     No facility-administered medications prior to visit.     ROS Review of Systems  Constitutional: Negative for activity change, appetite change, chills, fatigue and unexpected weight change.  HENT: Negative for congestion, mouth sores and sinus pressure.   Eyes: Negative for visual disturbance.  Respiratory: Negative for cough and chest tightness.   Gastrointestinal: Negative for abdominal pain and nausea.  Genitourinary: Negative for difficulty urinating, frequency and vaginal pain.  Musculoskeletal: Negative for back pain and gait problem.  Skin: Negative for pallor and rash.  Neurological:  Negative for dizziness, tremors, weakness, numbness and headaches.  Psychiatric/Behavioral: Positive for behavioral problems, confusion and decreased concentration. Negative for sleep disturbance and suicidal ideas.    Objective:  BP 126/72 (BP Location: Left Arm, Patient Position: Sitting, Cuff Size: Normal)   Pulse 63   Temp 98.2 F (36.8 C) (Oral)   Ht 5\' 3"  (1.6 m)   Wt 113 lb (51.3 kg)   SpO2 98%   BMI 20.02 kg/m   BP Readings from Last 3 Encounters:  10/04/17 126/72  08/27/17 (!) 146/82  08/16/17 (!) 144/82    Wt Readings from Last 3 Encounters:  10/04/17 113 lb (51.3 kg)  08/27/17 113 lb 0.6 oz (51.3 kg)  08/16/17 115 lb (52.2 kg)    Physical Exam  Constitutional: She appears well-developed. No distress.  HENT:  Head: Normocephalic.  Right Ear: External ear normal.  Left Ear: External ear normal.  Nose: Nose normal.  Mouth/Throat: Oropharynx is clear and moist.  Eyes: Conjunctivae are normal. Pupils are equal, round, and reactive to light. Right eye exhibits no discharge. Left eye exhibits no discharge.  Neck: Normal range of motion. Neck supple. No JVD present. No tracheal deviation present. No thyromegaly present.  Cardiovascular: Normal rate, regular rhythm and normal heart sounds.  Pulmonary/Chest: No stridor. No respiratory distress. She has no wheezes.  Abdominal: Soft. Bowel sounds are normal. She exhibits no distension and no  mass. There is no tenderness. There is no rebound and no guarding.  Musculoskeletal: She exhibits no edema or tenderness.  Lymphadenopathy:    She has no cervical adenopathy.  Neurological: She displays normal reflexes. No cranial nerve deficit. She exhibits normal muscle tone. Coordination abnormal.  Skin: No rash noted. No erythema.  confused NAD  Lab Results  Component Value Date   WBC 7.6 06/14/2017   HGB 11.9 (L) 06/14/2017   HCT 36.8 06/14/2017   PLT 268.0 06/14/2017   GLUCOSE 103 (H) 06/14/2017   CHOL 197 06/14/2017    TRIG 103.0 06/14/2017   HDL 66.10 06/14/2017   LDLDIRECT 123.0 04/16/2011   LDLCALC 110 (H) 06/14/2017   ALT 11 06/14/2017   AST 13 06/14/2017   NA 141 06/14/2017   K 4.5 06/14/2017   CL 104 06/14/2017   CREATININE 0.86 06/14/2017   BUN 13 06/14/2017   CO2 29 06/14/2017   TSH 3.50 07/26/2017   INR 2.84 (H) 01/08/2012   HGBA1C 7.1 (H) 05/27/2010    Dg Abd Acute W/chest  Result Date: 01/08/2012 *RADIOLOGY REPORT* Clinical Data: Abdominal pain, nausea and vomiting; abdominal distension.  History of bowel obstruction. ACUTE ABDOMEN SERIES (ABDOMEN 2 VIEW & CHEST 1 VIEW) Comparison: Abdominal radiograph performed 06/10/2010, and chest radiograph performed 09/12/2010 Findings: The lungs are well-aerated.  There is mild elevation of the left hemidiaphragm, with mild associated atelectasis.  Mild peribronchial thickening is noted.  There is no evidence of pleural effusion or pneumothorax.  The cardiomediastinal silhouette is borderline normal in size. The visualized bowel gas pattern is nonspecific.  Scattered air and fluid-filled loops of small and large bowel are seen.  There is no evidence of significant bowel dilatation to suggest obstruction. No free intra-abdominal air is identified on the provided upright view.  Scattered clips are noted about the abdomen and pelvis. No acute osseous abnormalities are seen; the sacroiliac joints are unremarkable in appearance.  Mild degenerative change is noted at the lower lumbar spine. IMPRESSION: 1.  Nonspecific bowel gas pattern; no free intra-abdominal air seen. 2.  Mild left basilar atelectasis; mild peribronchial thickening. Original Report Authenticated By: Santa Lighter, M.D.   Assessment & Plan:   There are no diagnoses linked to this encounter. I am having Gavin L. Fountaine maintain her nitroGLYCERIN, Vitamin D3, acetaminophen, omeprazole, pravastatin, ondansetron, ELIQUIS, b complex vitamins, donepezil, losartan, and methimazole.  No orders of  the defined types were placed in this encounter.    Follow-up: No Follow-up on file.  Walker Kehr, MD

## 2017-10-05 ENCOUNTER — Ambulatory Visit (INDEPENDENT_AMBULATORY_CARE_PROVIDER_SITE_OTHER)
Admission: RE | Admit: 2017-10-05 | Discharge: 2017-10-05 | Disposition: A | Discharge status: Home / Self Care | Payer: Medicare Other | Source: Ambulatory Visit | Attending: Internal Medicine | Admitting: Internal Medicine

## 2017-10-05 DIAGNOSIS — R410 Disorientation, unspecified: Secondary | ICD-10-CM

## 2017-10-05 NOTE — Progress Notes (Signed)
Remote pacemaker transmission.   

## 2017-10-06 ENCOUNTER — Encounter: Payer: Self-pay | Admitting: Cardiology

## 2017-10-11 ENCOUNTER — Encounter: Payer: Self-pay | Admitting: Internal Medicine

## 2017-10-11 ENCOUNTER — Ambulatory Visit (INDEPENDENT_AMBULATORY_CARE_PROVIDER_SITE_OTHER): Payer: Medicare Other | Admitting: Internal Medicine

## 2017-10-11 DIAGNOSIS — I482 Chronic atrial fibrillation, unspecified: Secondary | ICD-10-CM

## 2017-10-11 DIAGNOSIS — E038 Other specified hypothyroidism: Secondary | ICD-10-CM | POA: Diagnosis not present

## 2017-10-11 DIAGNOSIS — F419 Anxiety disorder, unspecified: Secondary | ICD-10-CM | POA: Diagnosis not present

## 2017-10-11 DIAGNOSIS — G301 Alzheimer's disease with late onset: Secondary | ICD-10-CM

## 2017-10-11 DIAGNOSIS — E039 Hypothyroidism, unspecified: Secondary | ICD-10-CM | POA: Insufficient documentation

## 2017-10-11 DIAGNOSIS — E063 Autoimmune thyroiditis: Secondary | ICD-10-CM

## 2017-10-11 DIAGNOSIS — F0280 Dementia in other diseases classified elsewhere without behavioral disturbance: Secondary | ICD-10-CM

## 2017-10-11 DIAGNOSIS — I10 Essential (primary) hypertension: Secondary | ICD-10-CM | POA: Diagnosis not present

## 2017-10-11 MED ORDER — ESCITALOPRAM OXALATE 5 MG PO TABS: 2.5000 mg | ORAL_TABLET | Freq: Every day | ORAL | 5 refills | Status: DC

## 2017-10-11 NOTE — Assessment & Plan Note (Signed)
Aricept.

## 2017-10-11 NOTE — Progress Notes (Signed)
Subjective:  Patient ID: [name], female    DOB: Dec 17, 1918  Age: 84 y.o. MRN: [id number]  CC: No chief complaint on file.   HPI [name] presents for a new hypothyroidism, elevated BNB. Confusion has resolved...  Outpatient Medications Prior to Visit  Medication Sig Dispense Refill  . acetaminophen (TYLENOL) 500 MG tablet Take 500 mg by mouth every 6 (six) hours as needed. For pain    . Cholecalciferol (VITAMIN D3) 2000 units capsule Take 2,000 Units by mouth daily.     Marland Kitchen donepezil (ARICEPT) 5 MG tablet Take 1 tablet (5 mg total) by mouth at bedtime. 30 tablet 5  . ELIQUIS 2.5 MG TABS tablet take 1 tablet by mouth twice a day 180 tablet 3  . levothyroxine (SYNTHROID) 25 MCG tablet Take 50 mcg/day x 1 week, then 1 a day 60 tablet 2  . losartan (COZAAR) 25 MG tablet Take 1 tablet (25 mg total) by mouth daily. 60 tablet 2  . nitroGLYCERIN (NITROSTAT) 0.4 MG SL tablet Place 0.4 mg under the tongue every 5 (five) minutes as needed. Total of 3 doses in 15 minutes.     Marland Kitchen omeprazole (PRILOSEC) 20 MG capsule Take 20 mg by mouth daily.    . ondansetron (ZOFRAN-ODT) 4 MG disintegrating tablet Take 4 mg by mouth every 8 (eight) hours as needed for nausea or vomiting.    . pravastatin (PRAVACHOL) 20 MG tablet Take 20 mg by mouth daily.    Marland Kitchen b complex vitamins tablet Take 1 tablet by mouth daily. 100 tablet 3  . cefUROXime (CEFTIN) 250 MG tablet Take 1 tablet (250 mg total) by mouth 2 (two) times daily for 14 days. 14 tablet 1   No facility-administered medications prior to visit.     ROS Review of Systems  Constitutional: Positive for fatigue. Negative for activity change, appetite change, chills and unexpected weight change.  HENT: Negative for congestion, mouth sores and sinus pressure.   Eyes: Negative for visual disturbance.  Respiratory: Negative for cough and chest tightness.   Gastrointestinal: Negative for abdominal pain and nausea.  Genitourinary: Negative for  difficulty urinating, frequency and vaginal pain.  Musculoskeletal: Negative for back pain and gait problem.  Skin: Negative for pallor and rash.  Neurological: Negative for dizziness, tremors, weakness, numbness and headaches.  Psychiatric/Behavioral: Positive for decreased concentration. Negative for confusion, sleep disturbance and suicidal ideas.    Objective:  BP 128/88 (BP Location: Left Arm, Patient Position: Sitting, Cuff Size: Normal)   Pulse 74   Temp 98.2 F (36.8 C) (Oral)   Ht 5\' 3"  (1.6 m)   Wt 114 lb (51.7 kg)   SpO2 98%   BMI 20.19 kg/m   BP Readings from Last 3 Encounters:  10/11/17 128/88  10/04/17 126/72  08/27/17 (!) 146/82    Wt Readings from Last 3 Encounters:  10/11/17 114 lb (51.7 kg)  10/04/17 113 lb (51.3 kg)  08/27/17 113 lb 0.6 oz (51.3 kg)    Physical Exam  Constitutional: She appears well-developed. No distress.  HENT:  Head: Normocephalic.  Right Ear: External ear normal.  Left Ear: External ear normal.  Nose: Nose normal.  Mouth/Throat: Oropharynx is clear and moist.  Eyes: Pupils are equal, round, and reactive to light. Conjunctivae are normal. Right eye exhibits no discharge. Left eye exhibits no discharge.  Neck: Normal range of motion. Neck supple. No JVD present. No tracheal deviation present. No thyromegaly present.  Cardiovascular: Normal rate, regular rhythm and normal  heart sounds.  Pulmonary/Chest: No stridor. No respiratory distress. She has no wheezes.  Abdominal: Soft. Bowel sounds are normal. She exhibits no distension and no mass. There is no tenderness. There is no rebound and no guarding.  Musculoskeletal: She exhibits no edema or tenderness.  Lymphadenopathy:    She has no cervical adenopathy.  Neurological: She displays normal reflexes. No cranial nerve deficit. She exhibits normal muscle tone. Coordination abnormal.  Skin: No rash noted. No erythema.  Psychiatric: Her behavior is normal. Thought content normal.    alert, cooperative Walker No edema  Lab Results  Component Value Date   WBC 9.0 10/04/2017   HGB 12.6 10/04/2017   HCT 38.6 10/04/2017   PLT 288.0 10/04/2017   GLUCOSE 95 10/04/2017   CHOL 197 06/14/2017   TRIG 103.0 06/14/2017   HDL 66.10 06/14/2017   LDLDIRECT 123.0 04/16/2011   LDLCALC 110 (H) 06/14/2017   ALT 10 10/04/2017   AST 12 10/04/2017   NA 137 10/04/2017   K 4.0 10/04/2017   CL 101 10/04/2017   CREATININE 1.07 10/04/2017   BUN 17 10/04/2017   CO2 28 10/04/2017   TSH 28.41 (H) 10/04/2017   INR 2.84 (H) 01/08/2012   HGBA1C 7.1 (H) 05/27/2010    Ct Head Wo Contrast  Result Date: 10/05/2017 CLINICAL DATA:  84 year old female with several days of confusion. No recent falls. Stroke last year. Initial encounter. EXAM: CT HEAD WITHOUT CONTRAST TECHNIQUE: Contiguous axial images were obtained from the base of the skull through the vertex without intravenous contrast. COMPARISON:  04/16/2009 CT. FINDINGS: Brain: No intracranial hemorrhage or CT evidence of large acute infarct. Remote right corona radiata infarct. Prominent chronic microvascular changes. Global atrophy. No intracranial mass lesion noted on this unenhanced exam. Vascular: Vascular calcifications Skull: Negative Sinuses/Orbits: Visualized orbits unremarkable. Visualized paranasal sinuses clear. Other: Visualized mastoid air cells and middle ear cavities clear. IMPRESSION: No acute intracranial abnormality. Remote right corona radiata infarct and prominent chronic microvascular changes. Atrophy. Electronically Signed   By: Genia Del M.D.   On: 10/05/2017 17:56    Assessment & Plan:   There are no diagnoses linked to this encounter. I have discontinued Tashyra L. Collings's b complex vitamins and cefUROXime. I am also having her maintain her nitroGLYCERIN, Vitamin D3, acetaminophen, omeprazole, pravastatin, ondansetron, ELIQUIS, donepezil, losartan, and levothyroxine.  No orders of the defined types were  placed in this encounter.    Follow-up: No follow-ups on file.  Walker Kehr, MD

## 2017-10-11 NOTE — Assessment & Plan Note (Signed)
BP Readings from Last 3 Encounters:  10/11/17 128/88  10/04/17 126/72  08/27/17 (!) 146/82

## 2017-10-11 NOTE — Assessment & Plan Note (Signed)
Lexapro discussed - low dose

## 2017-10-11 NOTE — Assessment & Plan Note (Signed)
Eliquis, Coumadin

## 2017-10-11 NOTE — Assessment & Plan Note (Signed)
D/c methimazole  Started Levothroid

## 2017-10-12 LAB — CUP PACEART REMOTE DEVICE CHECK
Battery Voltage: 3.12 V
Implantable Lead Implant Date: 20180501
Implantable Pulse Generator Implant Date: 20180501
Lead Channel Impedance Value: 437 Ohm
Lead Channel Pacing Threshold Amplitude: 0.625 V
Lead Channel Pacing Threshold Pulse Width: 0.4 ms
Lead Channel Sensing Intrinsic Amplitude: 9 mV
Lead Channel Setting Pacing Pulse Width: 0.4 ms
MDC IDC LEAD LOCATION: 753860
MDC IDC MSMT BATTERY REMAINING LONGEVITY: 160 mo
MDC IDC MSMT LEADCHNL RV IMPEDANCE VALUE: 627 Ohm
MDC IDC MSMT LEADCHNL RV SENSING INTR AMPL: 9 mV
MDC IDC SESS DTM: 20190319130809
MDC IDC SET LEADCHNL RV PACING AMPLITUDE: 2 V
MDC IDC SET LEADCHNL RV SENSING SENSITIVITY: 1.2 mV
MDC IDC STAT BRADY RV PERCENT PACED: 98.74 %

## 2017-10-25 ENCOUNTER — Ambulatory Visit (INDEPENDENT_AMBULATORY_CARE_PROVIDER_SITE_OTHER): Payer: Medicare Other | Admitting: Endocrinology

## 2017-10-25 ENCOUNTER — Encounter: Payer: Self-pay | Admitting: Endocrinology

## 2017-10-25 ENCOUNTER — Telehealth: Payer: Self-pay | Admitting: Internal Medicine

## 2017-10-25 VITALS — BP 176/100 | HR 80 | Wt 110.4 lb

## 2017-10-25 DIAGNOSIS — M6281 Muscle weakness (generalized): Secondary | ICD-10-CM | POA: Diagnosis not present

## 2017-10-25 DIAGNOSIS — R262 Difficulty in walking, not elsewhere classified: Secondary | ICD-10-CM | POA: Diagnosis not present

## 2017-10-25 DIAGNOSIS — Z9181 History of falling: Secondary | ICD-10-CM | POA: Diagnosis not present

## 2017-10-25 DIAGNOSIS — E0590 Thyrotoxicosis, unspecified without thyrotoxic crisis or storm: Secondary | ICD-10-CM

## 2017-10-25 DIAGNOSIS — R300 Dysuria: Secondary | ICD-10-CM

## 2017-10-25 DIAGNOSIS — R2681 Unsteadiness on feet: Secondary | ICD-10-CM | POA: Diagnosis not present

## 2017-10-25 LAB — TSH: TSH: 1.92 u[IU]/mL (ref 0.35–4.50)

## 2017-10-25 LAB — T4, FREE: FREE T4: 1.21 ng/dL (ref 0.60–1.60)

## 2017-10-25 NOTE — Progress Notes (Signed)
Subjective:    Patient ID: [name] Person, female    DOB: 07-24-1918, 84 y.o.   MRN: [id number]  HPI Pt returns for f/u of hyperthyroidism (dx'ed 2018, in [place]; she has been on tapazole since dx; she has never had thyroid imaging; dtr provides hx, due to pt's memory loss).  In 3/19, TSH was high, and methimazole was changed to synthroid.  Since then, dtr says pt's memory and ADL's are worse.  Past Medical History:  Diagnosis Date  . Anxiety   . Aortic stenosis    s/p TAVR  . Arthritis   . Atrial fibrillation ([place]) 2011  . Atrioventricular block, complete ([place])    Pacemaker insertion 11/17/16, Medtronic  . COPD (chronic obstructive pulmonary disease) ([place])   . Diverticulosis of colon   . GERD (gastroesophageal reflux disease)    Dr. Earlean Shawl  . Hx of adenomatous colonic polyps   . Hyperlipemia   . Hypertension   . Mesenteric mass    2 cm  . Peripheral vascular disease, unspecified ([place])   . PVD (peripheral vascular disease) ([facility])    Dr. Elisabeth Cara  . SBO (small bowel obstruction) ([facility])     due to adhesions  . Stricture and stenosis of esophagus   . Thyroid disease   . Unspecified peritonitis   . Unspecified vitamin D deficiency     Past Surgical History:  Procedure Laterality Date  . ABDOMINAL ADHESION SURGERY    . APPENDECTOMY  1997   ruptured  . CARDIAC VALVE REPLACEMENT     TAVR  . CATARACT EXTRACTION, BILATERAL Bilateral 2016  . CHOLECYSTECTOMY    . COLOSTOMY CLOSURE  1997   with end-to-end coloproctostomy anstomosis. ruptured  . CORONARY ANGIOPLASTY WITH STENT PLACEMENT  2018   3 stents  . HIP SURGERY    . OMENTECTOMY     Partial with gastrostomy  . PACEMAKER IMPLANT  11/17/2016   implanted at [facility],  Medtronic [id number] Azure XT SR MRI, QM:[id number], Lead: RV Medtronic [id number]. DX Atrioventricular Block/ I44.2  . SIGMOIDECTOMY     Segemental with colostomy and Hartmann    Social History   Socioeconomic History  . Marital status: Widowed   Spouse name: Not on file  . Number of children: Not on file  . Years of education: Not on file  . Highest education level: Not on file  Occupational History  . Occupation: Retired    Fish farm manager: RETIRED  Social Needs  . Financial resource strain: Not on file  . Food insecurity:    Worry: Not on file    Inability: Not on file  . Transportation needs:    Medical: Not on file    Non-medical: Not on file  Tobacco Use  . Smoking status: Former Smoker    Last attempt to quit: 08/01/1963    Years since quitting: 54.2  . Smokeless tobacco: Never Used  Substance and Sexual Activity  . Alcohol use: No  . Drug use: No  . Sexual activity: Never  Lifestyle  . Physical activity:    Days per week: Not on file    Minutes per session: Not on file  . Stress: Not on file  Relationships  . Social connections:    Talks on phone: Not on file    Gets together: Not on file    Attends religious service: Not on file    Active member of club or organization: Not on file    Attends meetings of clubs or organizations: Not on file  Relationship status: Not on file  . Intimate partner violence:    Fear of current or ex partner: Not on file    Emotionally abused: Not on file    Physically abused: Not on file    Forced sexual activity: Not on file  Other Topics Concern  . Not on file  Social History Narrative   GYN-Dr. Theda Sers      Regular exercise-yes at [employer]    Current Outpatient Medications on File Prior to Visit  Medication Sig Dispense Refill  . acetaminophen (TYLENOL) 500 MG tablet Take 500 mg by mouth every 6 (six) hours as needed. For pain    . Cholecalciferol (VITAMIN D3) 2000 units capsule Take 2,000 Units by mouth daily.     Marland Kitchen donepezil (ARICEPT) 5 MG tablet Take 1 tablet (5 mg total) by mouth at bedtime. 30 tablet 5  . ELIQUIS 2.5 MG TABS tablet take 1 tablet by mouth twice a day 180 tablet 3  . escitalopram (LEXAPRO) 5 MG tablet Take 0.5-1 tablets (2.5-5 mg total) by mouth daily.  30 tablet 5  . losartan (COZAAR) 25 MG tablet Take 1 tablet (25 mg total) by mouth daily. 60 tablet 2  . nitroGLYCERIN (NITROSTAT) 0.4 MG SL tablet Place 0.4 mg under the tongue every 5 (five) minutes as needed. Total of 3 doses in 15 minutes.     Marland Kitchen omeprazole (PRILOSEC) 20 MG capsule Take 20 mg by mouth daily.    . ondansetron (ZOFRAN-ODT) 4 MG disintegrating tablet Take 4 mg by mouth every 8 (eight) hours as needed for nausea or vomiting.    . pravastatin (PRAVACHOL) 20 MG tablet Take 20 mg by mouth daily.     No current facility-administered medications on file prior to visit.     Allergies  Allergen Reactions  . Benicar [Olmesartan Medoxomil]     hyperkalemia  . Ezetimibe-Simvastatin     REACTION: ACHES  . Hydrochlorothiazide     REACTION: ? rash  . Lisinopril     REACTION: cough  . Promethazine Hcl     REACTION: palpitations    Family History  Problem Relation Age of Onset  . Coronary artery disease Father   . Early death Father   . Hypertension Father   . Kidney disease Father   . Hypothyroidism Daughter   . Mental illness Mother   . Miscarriages / [place] Mother   . Stroke Mother   . Hypertension Other   . Colon cancer Neg Hx     BP (!) 176/100 (BP Location: Left Arm, Cuff Size: Normal)   Pulse 80   Wt 110 lb 6.4 oz (50.1 kg)   SpO2 94%   BMI 19.56 kg/m   Review of Systems Denies fever    Objective:   Physical Exam VITAL SIGNS:  See vs page GENERAL: no distress NECK: thyroid is approx twice normal size, (R>L), but no palpable nodule. Neuro: no tremor     Assessment & Plan:  HTN: is noted today. Hyperthyroidism: was overcontrolled on tapazole, now on synthroid.   Patient Instructions  Your blood pressure is high today.  Please see your primary care provider soon, to have it rechecked.  blood tests are requested for you today.  We'll let you know about the results. Based on the results, we may need to stop all thyroid meds for now, or resume the  methimazole at a lower strength. If ever you have fever while taking methimazole, stop it and call us, even if the reason  is obvious, because of the risk of a rare side-effect.  Please come back for a follow-up appointment in 1 month.

## 2017-10-25 NOTE — Telephone Encounter (Signed)
Copied from [place]. Topic: Quick Communication - See Telephone Encounter >> Oct 25, 2017 10:53 AM Cleaster Corin, NT wrote: CRM for notification. See Telephone encounter for: 10/25/17.  Pt. Daughter mary calling to speak with nurse about pt. Possibly having uti  wanting to bring her by the office to give a urine sample and no appt. Pt. Daughter can be reached at [phone]  Daughter is out with pt. Now at another doctors appt. Wanted to come by while they are out also being that the daughter is able to bring her today

## 2017-10-25 NOTE — Patient Instructions (Addendum)
Your blood pressure is high today.  Please see your primary care provider soon, to have it rechecked.  blood tests are requested for you today.  We'll let you know about the results. Based on the results, we may need to stop all thyroid meds for now, or resume the methimazole at a lower strength. If ever you have fever while taking methimazole, stop it and call us, even if the reason is obvious, because of the risk of a rare side-effect.  Please come back for a follow-up appointment in 1 month.

## 2017-10-26 DIAGNOSIS — R41841 Cognitive communication deficit: Secondary | ICD-10-CM | POA: Diagnosis not present

## 2017-10-26 DIAGNOSIS — R2681 Unsteadiness on feet: Secondary | ICD-10-CM | POA: Diagnosis not present

## 2017-10-26 DIAGNOSIS — Z9181 History of falling: Secondary | ICD-10-CM | POA: Diagnosis not present

## 2017-10-26 DIAGNOSIS — R262 Difficulty in walking, not elsewhere classified: Secondary | ICD-10-CM | POA: Diagnosis not present

## 2017-10-26 DIAGNOSIS — M6281 Muscle weakness (generalized): Secondary | ICD-10-CM | POA: Diagnosis not present

## 2017-10-26 NOTE — Telephone Encounter (Signed)
Ok UA Thx 

## 2017-10-26 NOTE — Telephone Encounter (Signed)
Please advise 

## 2017-10-26 NOTE — Telephone Encounter (Signed)
Patient's daughter Sherrie Sport calling to check on status of getting mom in for possible UTI. Awaiting a call from [place].

## 2017-10-27 ENCOUNTER — Encounter: Payer: Self-pay | Admitting: Nurse Practitioner

## 2017-10-27 ENCOUNTER — Other Ambulatory Visit: Payer: Medicare Other

## 2017-10-27 ENCOUNTER — Ambulatory Visit: Payer: Self-pay | Admitting: Nurse Practitioner

## 2017-10-27 VITALS — BP 150/92 | HR 82 | Temp 97.4°F

## 2017-10-27 DIAGNOSIS — M6281 Muscle weakness (generalized): Secondary | ICD-10-CM | POA: Diagnosis not present

## 2017-10-27 DIAGNOSIS — Z9181 History of falling: Secondary | ICD-10-CM | POA: Diagnosis not present

## 2017-10-27 DIAGNOSIS — R300 Dysuria: Secondary | ICD-10-CM

## 2017-10-27 DIAGNOSIS — R262 Difficulty in walking, not elsewhere classified: Secondary | ICD-10-CM | POA: Diagnosis not present

## 2017-10-27 DIAGNOSIS — R41841 Cognitive communication deficit: Secondary | ICD-10-CM | POA: Diagnosis not present

## 2017-10-27 DIAGNOSIS — N390 Urinary tract infection, site not specified: Secondary | ICD-10-CM

## 2017-10-27 DIAGNOSIS — R410 Disorientation, unspecified: Secondary | ICD-10-CM

## 2017-10-27 DIAGNOSIS — R2681 Unsteadiness on feet: Secondary | ICD-10-CM | POA: Diagnosis not present

## 2017-10-27 LAB — POC URINALSYSI DIPSTICK (AUTOMATED)
Bilirubin, UA: NEGATIVE
Glucose, UA: NEGATIVE
Nitrite, UA: NEGATIVE
PH UA: 5 (ref 5.0–8.0)
SPEC GRAV UA: 1.025 (ref 1.010–1.025)
UROBILINOGEN UA: 0.2 U/dL

## 2017-10-27 MED ORDER — CEFUROXIME AXETIL 250 MG PO TABS: 250.0000 mg | ORAL_TABLET | Freq: Two times a day (BID) | ORAL | 0 refills | Status: DC

## 2017-10-27 NOTE — Telephone Encounter (Signed)
Labs entered daughter notified

## 2017-10-27 NOTE — Addendum Note (Signed)
Addended by: Karren Cobble on: 10/27/2017 09:22 AM   Modules accepted: Orders

## 2017-10-27 NOTE — Patient Instructions (Addendum)
Urinary Tract Infection, Adult A urinary tract infection (UTI) is an infection of any part of the urinary tract, which includes the kidneys, ureters, bladder, and urethra. These organs make, store, and get rid of urine in the body. UTI can be a bladder infection (cystitis) or kidney infection (pyelonephritis). What are the causes? This infection may be caused by fungi, viruses, or bacteria. Bacteria are the most common cause of UTIs. This condition can also be caused by repeated incomplete emptying of the bladder during urination. What increases the risk? This condition is more likely to develop if:  You ignore your need to urinate or hold urine for long periods of time.  You do not empty your bladder completely during urination.  You wipe back to front after urinating or having a bowel movement, if you are female.  You are uncircumcised, if you are female.  You are constipated.  You have a urinary catheter that stays in place (indwelling).  You have a weak defense (immune) system.  You have a medical condition that affects your bowels, kidneys, or bladder.  You have diabetes.  You take antibiotic medicines frequently or for long periods of time, and the antibiotics no longer work well against certain types of infections (antibiotic resistance).  You take medicines that irritate your urinary tract.  You are exposed to chemicals that irritate your urinary tract.  You are female.  What are the signs or symptoms? Symptoms of this condition include:  Fever.  Frequent urination or passing small amounts of urine frequently.  Needing to urinate urgently.  Pain or burning with urination.  Urine that smells bad or unusual.  Cloudy urine.  Pain in the lower abdomen or back.  Trouble urinating.  Blood in the urine.  Vomiting or being less hungry than normal.  Diarrhea or abdominal pain.  Vaginal discharge, if you are female.  How is this diagnosed? This condition is  diagnosed with a medical history and physical exam. You will also need to provide a urine sample to test your urine. Other tests may be done, including:  Blood tests.  Sexually transmitted disease (STD) testing.  If you have had more than one UTI, a cystoscopy or imaging studies may be done to determine the cause of the infections. How is this treated? Treatment for this condition often includes a combination of two or more of the following:  Antibiotic medicine.  Other medicines to treat less common causes of UTI.  Over-the-counter medicines to treat pain.  Drinking enough water to stay hydrated.  Follow these instructions at home:  Take over-the-counter and prescription medicines only as told by your health care provider.  If you were prescribed an antibiotic, take it as told by your health care provider. Do not stop taking the antibiotic even if you start to feel better.  Avoid alcohol, caffeine, tea, and carbonated beverages. They can irritate your bladder.  Drink enough fluid to keep your urine clear or pale yellow.  Keep all follow-up visits as told by your health care provider. This is important.  Make sure to: ? Empty your bladder often and completely. Do not hold urine for long periods of time. ? Empty your bladder before and after sex. ? Wipe from front to back after a bowel movement if you are female. Use each tissue one time when you wipe. ? Patient's daughters informed to follow-up with Dr. Alain Marion.  Possible repeat of urinalysis and culture per the doctor's decision Contact a health care provider if:  You have back pain.  You have a fever.  You feel nauseous or vomit.  Your symptoms do not get better after 3 days.  Your symptoms go away and then return. Get help right away if:  You have severe back pain or lower abdominal pain.  You are vomiting and cannot keep down any medicines or water. This information is not intended to replace advice given to  you by your health care provider. Make sure you discuss any questions you have with your health care provider. Document Released: 04/15/2005 Document Revised: 12/18/2015 Document Reviewed: 05/27/2015 Elsevier Interactive Patient Education  [employer].

## 2017-10-27 NOTE — Addendum Note (Signed)
Addended by: Karren Cobble on: 10/27/2017 03:27 PM   Modules accepted: Orders

## 2017-10-27 NOTE — Progress Notes (Addendum)
Subjective:    Patient ID: [name], female    DOB: 04/27/1919, 84 y.o.   MRN: [id number]  [name] is a 84 year old female brought in by HER-2 daughters for complaints of confusion.  Patient currently lives in [facility] independent living [facility] and states that they inform them 2 days ago that there mother was having confusion.  Her daughter state that she has been sitting around and forgetting things as well as walking around without her rollator.  Per her daughters the patient has not complained of any urinary symptoms fever chills or abdominal pain.  The patient is sitting in her Rollator awake and alert oriented to self but has no complaints at this time.  The patient was recently treated by Dr. Alain Marion for a suspected UTI which in which the medications were stopped due to urinalysis results.  Patient's daughters also state patient's appetite is normal however she does not drink very much and is most likely not urinating every 2 hours.  Per her daughters the patient is normally independent at baseline and lives alone currently.  The patient has not received any treatment for her current symptoms at this time.    Patient's past medical history, allergies, and medications were reviewed.   Review of Systems  Constitutional: Positive for activity change. Negative for appetite change, fatigue and fever.  HENT: Negative.   Eyes: Negative.   Respiratory: Negative.   Cardiovascular: Negative.   Gastrointestinal: Negative for abdominal pain, diarrhea, nausea and vomiting.  Genitourinary: Positive for decreased urine volume.       Patient denies urgency hesitancy frequency and dysuria.  Patient also denies flank pain and low back pain.       Objective:   Physical Exam  Constitutional: No distress.  Thin, fragile appearing, smiling and answers appropriately  HENT:  Head: Normocephalic and atraumatic.  Neck: Normal range of motion. Neck supple. No tracheal deviation  present. No thyromegaly present.  Cardiovascular: Normal rate, regular rhythm and normal heart sounds.  Pulmonary/Chest: Effort normal and breath sounds normal.  Abdominal: Soft. Bowel sounds are normal. She exhibits no distension. There is no tenderness.  No CVA tenderness bilaterally  Neurological: She is alert.  Alert and oriented to self  Skin: Skin is warm and dry.  Psychiatric: She has a normal mood and affect.   Urinalysis results Glucose: Negative Bilirubin: Negative Ketones: Trace Specific gravity: 1.025 Blood: Trace PH: 5.0 Protein: 2+ Uro: Negative Nitrates: Negative Leukocytes: Moderate     Assessment & Plan:  Acute urinary tract infection 1.  Ceftin 250 mg twice daily for 10 days.  Use of this medication was resumed due to previous tolerance as indicated by primary care physician.  Patient's daughter is instructed to follow-up with Dr. Alain Marion may decide to repeat urinalysis and send for culture at his discretion. 2.  Tylenol for pain fever or general discomfort. 3.  Avoid caffeine such as sodas and teas coffees until symptoms improve. 4.  Encourage water can also use cranberry juice during symptoms. 5.  Develop toileting schedule for patient. 6.  Go to ER if fever chills abdominal pain or worsening urinary tract infection symptoms. 7.  Patient education provided to daughters.  Daughters had no questions at time of discharge. Meds ordered this encounter  Medications  . cefUROXime (CEFTIN) 250 MG tablet    Sig: Take 1 tablet (250 mg total) by mouth 2 (two) times daily with a meal for 10 days.    Dispense:  20 tablet  Refill:  0    Order Specific Question:   Supervising Provider    Answer:   Ricard Dillon [phone]

## 2017-10-28 DIAGNOSIS — R2681 Unsteadiness on feet: Secondary | ICD-10-CM | POA: Diagnosis not present

## 2017-10-28 DIAGNOSIS — R262 Difficulty in walking, not elsewhere classified: Secondary | ICD-10-CM | POA: Diagnosis not present

## 2017-10-28 DIAGNOSIS — R41841 Cognitive communication deficit: Secondary | ICD-10-CM | POA: Diagnosis not present

## 2017-10-28 DIAGNOSIS — M6281 Muscle weakness (generalized): Secondary | ICD-10-CM | POA: Diagnosis not present

## 2017-10-28 DIAGNOSIS — Z9181 History of falling: Secondary | ICD-10-CM | POA: Diagnosis not present

## 2017-10-29 ENCOUNTER — Ambulatory Visit: Payer: Self-pay

## 2017-10-29 ENCOUNTER — Encounter: Payer: Self-pay | Admitting: Family

## 2017-10-29 ENCOUNTER — Other Ambulatory Visit: Payer: Medicare Other

## 2017-10-29 ENCOUNTER — Ambulatory Visit (INDEPENDENT_AMBULATORY_CARE_PROVIDER_SITE_OTHER): Payer: Medicare Other | Admitting: Family

## 2017-10-29 ENCOUNTER — Telehealth: Payer: Self-pay | Admitting: Internal Medicine

## 2017-10-29 VITALS — BP 140/80 | HR 61 | Temp 98.6°F | Ht 63.0 in | Wt 113.1 lb

## 2017-10-29 DIAGNOSIS — M6281 Muscle weakness (generalized): Secondary | ICD-10-CM | POA: Diagnosis not present

## 2017-10-29 DIAGNOSIS — R410 Disorientation, unspecified: Secondary | ICD-10-CM

## 2017-10-29 DIAGNOSIS — R262 Difficulty in walking, not elsewhere classified: Secondary | ICD-10-CM | POA: Diagnosis not present

## 2017-10-29 DIAGNOSIS — I10 Essential (primary) hypertension: Secondary | ICD-10-CM

## 2017-10-29 DIAGNOSIS — Z9181 History of falling: Secondary | ICD-10-CM | POA: Diagnosis not present

## 2017-10-29 DIAGNOSIS — N390 Urinary tract infection, site not specified: Secondary | ICD-10-CM

## 2017-10-29 DIAGNOSIS — R41841 Cognitive communication deficit: Secondary | ICD-10-CM | POA: Diagnosis not present

## 2017-10-29 DIAGNOSIS — R2681 Unsteadiness on feet: Secondary | ICD-10-CM | POA: Diagnosis not present

## 2017-10-29 MED ORDER — LOSARTAN POTASSIUM 25 MG PO TABS: 25.0000 mg | ORAL_TABLET | Freq: Two times a day (BID) | ORAL | 2 refills | Status: AC

## 2017-10-29 NOTE — Telephone Encounter (Signed)
Hope, pt.'s daughter called - concerned with pt.'s BP. States she has had elevated readings "all week and I think her medicine should be looked at." Pt. Asymptomatic - no headache or blurred vision. Appointment made for today. Reason for Disposition . Systolic BP  >= 211 OR Diastolic >= 173  Answer Assessment - Initial Assessment Questions 1. BLOOD PRESSURE: "What is the blood pressure?" "Did you take at least two measurements 5 minutes apart?"     172/82 2. ONSET: "When did you take your blood pressure?"     This morning 3. HOW: "How did you obtain the blood pressure?" (e.g., visiting nurse, automatic home BP monitor)     Visiting nurse 4. HISTORY: "Do you have a history of high blood pressure?"     Yes 5. MEDICATIONS: "Are you taking any medications for blood pressure?" "Have you missed any doses recently?"     No 6. OTHER SYMPTOMS: "Do you have any symptoms?" (e.g., headache, chest pain, blurred vision, difficulty breathing, weakness)     No 7. PREGNANCY: "Is there any chance you are pregnant?" "When was your last menstrual period?"     No  Protocols used: HIGH BLOOD PRESSURE-A-AH

## 2017-10-29 NOTE — Progress Notes (Signed)
[name] is a 84 y.o. female with the following history as recorded in EpicCare:  Patient Active Problem List   Diagnosis Date Noted  . Hypothyroid 10/11/2017  . Edema 10/04/2017  . Hearing loss 08/16/2017  . Dementia 08/16/2017  . Hyperthyroidism 06/14/2017  . Memory loss 06/14/2017  . Well adult exam 02/03/2012  . Fall against object 04/21/2011  . Hyperkalemia 12/03/2010  . Confusion 11/26/2010  . CORONARY ARTERY DISEASE 10/04/2010  . URI 09/12/2010  . ALLERGIC RHINITIS DUE TO OTHER ALLERGEN 09/12/2010  . ESOPHAGEAL STRICTURE 08/28/2010  . PERITONITIS 08/28/2010  . COLONIC POLYPS, ADENOMATOUS, HX OF 08/28/2010  . WEIGHT LOSS 07/09/2010  . DYSPHAGIA UNSPECIFIED 07/09/2010  . Abdominal pain, generalized 07/09/2010  . Aortic valve disorder 05/21/2010  . Atrial fibrillation ([place]) 05/21/2010  . HEART MURMUR, SYSTOLIC 17/61/6073  . ECCHYMOSES 04/08/2010  . Anxiety 01/24/2010  . CHEST PAIN 01/24/2010  . TOBACCO USE, QUIT 05/07/2009  . CHEST WALL PAIN 04/17/2009  . CONTUSION OF FACE SCALP AND NECK EXCEPT EYE 04/17/2009  . DIZZINESS 03/11/2009  . CONJUNCTIVITIS 09/11/2008  . ORAL ULCER 09/11/2008  . RASH AND OTHER NONSPECIFIC SKIN ERUPTION 09/11/2008  . Pain in limb 11/02/2007  . PARESTHESIA 11/02/2007  . UNSPECIFIED VITAMIN D DEFICIENCY 08/29/2007  . NAUSEA ALONE 08/29/2007  . SBO 07/27/2007  . Dyslipidemia 02/26/2007  . Essential hypertension 02/26/2007  . PERIPHERAL VASCULAR DISEASE 02/26/2007  . COPD 02/26/2007  . GERD 02/26/2007  . DIVERTICULOSIS, COLON 02/26/2007  . ADNEXAL MASS 02/26/2007    Current Outpatient Medications  Medication Sig Dispense Refill  . acetaminophen (TYLENOL) 500 MG tablet Take 500 mg by mouth every 6 (six) hours as needed. For pain    . cefUROXime (CEFTIN) 250 MG tablet Take 1 tablet (250 mg total) by mouth 2 (two) times daily with a meal for 10 days. 20 tablet 0  . Cholecalciferol (VITAMIN D3) 2000 units capsule Take 2,000 Units  by mouth daily.     Marland Kitchen donepezil (ARICEPT) 5 MG tablet Take 1 tablet (5 mg total) by mouth at bedtime. 30 tablet 5  . ELIQUIS 2.5 MG TABS tablet take 1 tablet by mouth twice a day 180 tablet 3  . escitalopram (LEXAPRO) 5 MG tablet Take 0.5-1 tablets (2.5-5 mg total) by mouth daily. 30 tablet 5  . losartan (COZAAR) 25 MG tablet Take 1 tablet (25 mg total) by mouth 2 (two) times daily. 60 tablet 2  . nitroGLYCERIN (NITROSTAT) 0.4 MG SL tablet Place 0.4 mg under the tongue every 5 (five) minutes as needed. Total of 3 doses in 15 minutes.     Marland Kitchen omeprazole (PRILOSEC) 20 MG capsule Take 20 mg by mouth daily.    . ondansetron (ZOFRAN-ODT) 4 MG disintegrating tablet Take 4 mg by mouth every 8 (eight) hours as needed for nausea or vomiting.    . pravastatin (PRAVACHOL) 20 MG tablet Take 20 mg by mouth daily.     No current facility-administered medications for this visit.     Allergies: Benicar [olmesartan medoxomil]; Ezetimibe-simvastatin; Hydrochlorothiazide; Lisinopril; and Promethazine hcl  Past Medical History:  Diagnosis Date  . Anxiety   . Aortic stenosis    s/p TAVR  . Arthritis   . Atrial fibrillation ([place]) 2011  . Atrioventricular block, complete ([place])    Pacemaker insertion 11/17/16, Medtronic  . COPD (chronic obstructive pulmonary disease) ([place])   . Diverticulosis of colon   . GERD (gastroesophageal reflux disease)    Dr. Earlean Shawl  . Hx of adenomatous colonic  polyps   . Hyperlipemia   . Hypertension   . Mesenteric mass    2 cm  . Peripheral vascular disease, unspecified ([place])   . PVD (peripheral vascular disease) ([facility])    Dr. Elisabeth Cara  . SBO (small bowel obstruction) ([facility])     due to adhesions  . Stricture and stenosis of esophagus   . Thyroid disease   . Unspecified peritonitis   . Unspecified vitamin D deficiency     Past Surgical History:  Procedure Laterality Date  . ABDOMINAL ADHESION SURGERY    . APPENDECTOMY  1997   ruptured  . CARDIAC VALVE REPLACEMENT     TAVR  .  CATARACT EXTRACTION, BILATERAL Bilateral 2016  . CHOLECYSTECTOMY    . COLOSTOMY CLOSURE  1997   with end-to-end coloproctostomy anstomosis. ruptured  . CORONARY ANGIOPLASTY WITH STENT PLACEMENT  2018   3 stents  . HIP SURGERY    . OMENTECTOMY     Partial with gastrostomy  . PACEMAKER IMPLANT  11/17/2016   implanted at [facility],  Medtronic [id number] Azure XT SR MRI, QQ:[id number], Lead: RV Medtronic [id number]. DX Atrioventricular Block/ I44.2  . SIGMOIDECTOMY     Segemental with colostomy and Hartmann    Family History  Problem Relation Age of Onset  . Coronary artery disease Father   . Early death Father   . Hypertension Father   . Kidney disease Father   . Hypothyroidism Daughter   . Mental illness Mother   . Miscarriages / [place] Mother   . Stroke Mother   . Hypertension Other   . Colon cancer Neg Hx     Social History   Tobacco Use  . Smoking status: Former Smoker    Last attempt to quit: 08/01/1963    Years since quitting: 54.2  . Smokeless tobacco: Never Used  Substance Use Topics  . Alcohol use: No    Subjective:  Patient is accompanied by her daughter with concerns about blood pressure; has been noted to be elevated "for a while"; notes that healthcare providers at patient's living facility have been commenting on her blood pressure being high; this am, before medication, blood pressure was at 172/82; currently taking 12.5 mg bid of Losartan;  Per daughter, the patient has been having increased problems with confusion recently; not sure if this is related to recent thyroid complications, UTI or blood pressure; was seen at [facility] 2 days ago and diagnosed with UTI- is 2 days into Keflex treatment; daughter would like to get culture done to make sure infection being treated appropriately; Patient does not participate in visit; she rests quietly and allows her daughter to talk for her.   Objective:  Vitals:   10/29/17 1432  BP: 140/80  Pulse: 61  Temp: 98.6 F (37 C)   TempSrc: Oral  SpO2: 99%  Weight: 113 lb 1.3 oz (51.3 kg)  Height: 5\' 3"  (1.6 m)    General: Well developed, well nourished, in no acute distress  Skin : Warm and dry.  Head: Normocephalic and atraumatic  Eyes: Sclera and conjunctiva clear; pupils round and reactive to light; extraocular movements intact  Ears: External normal; canals clear; tympanic membranes normal  Oropharynx: Pink, supple. No suspicious lesions  Neck: Supple without thyromegaly, adenopathy  Lungs: Respirations unlabored; clear to auscultation bilaterally without wheeze, rales, rhonchi  Neurologic: Not engaged in office visit; rests with eyes closed during majority of visit; smiles when spoken to;  Assessment:  1. Essential hypertension   2. Urinary tract  infection without hematuria, site unspecified   3. Confusion     Plan:  1. Per data provided by family members, is not controlled; increase to Losartan 25 mg bid; continue to check blood pressure regularly and call back with response; 2. Continue current antibiotics; check urine culture today; 3. Patient had CT done in 09/2017 due to these symptoms- no acute changes; discussed possible complication from recent thyroid medication change as well as uncontrolled blood pressure and UTI; will see how she responds to new blood pressure regimen; keep planned follow-up with endocrine.   No follow-ups on file.  Orders Placed This Encounter  Procedures  . Urine Culture    Standing Status:   Future    Number of Occurrences:   1    Standing Expiration Date:   10/29/2018    Requested Prescriptions   Signed Prescriptions Disp Refills  . losartan (COZAAR) 25 MG tablet 60 tablet 2    Sig: Take 1 tablet (25 mg total) by mouth 2 (two) times daily.

## 2017-10-29 NOTE — Telephone Encounter (Signed)
FYI

## 2017-10-29 NOTE — Telephone Encounter (Signed)
Pharmacy notified 1 BID

## 2017-10-29 NOTE — Telephone Encounter (Signed)
Copied from [place] [id number]. Topic: Quick Communication - See Telephone Encounter >> Oct 29, 2017 12:26 PM Synthia Innocent wrote: CRM for notification. See Telephone encounter for: 10/29/17. Pharmacy would like to clarify dosage on losartan (COZAAR) 25 MG tablet

## 2017-10-30 ENCOUNTER — Emergency Department (HOSPITAL_COMMUNITY): Payer: Medicare Other

## 2017-10-30 ENCOUNTER — Encounter (HOSPITAL_COMMUNITY): Payer: Self-pay

## 2017-10-30 ENCOUNTER — Inpatient Hospital Stay (HOSPITAL_COMMUNITY)
Admission: EM | Admit: 2017-10-30 | Discharge: 2017-11-03 | DRG: 066 | Disposition: A | Discharge status: Skilled Nursing Facility | Payer: Medicare Other | Attending: Family Medicine | Admitting: Family Medicine

## 2017-10-30 ENCOUNTER — Other Ambulatory Visit: Payer: Self-pay

## 2017-10-30 DIAGNOSIS — Z955 Presence of coronary angioplasty implant and graft: Secondary | ICD-10-CM

## 2017-10-30 DIAGNOSIS — I10 Essential (primary) hypertension: Secondary | ICD-10-CM | POA: Diagnosis present

## 2017-10-30 DIAGNOSIS — R488 Other symbolic dysfunctions: Secondary | ICD-10-CM | POA: Diagnosis not present

## 2017-10-30 DIAGNOSIS — J3089 Other allergic rhinitis: Secondary | ICD-10-CM | POA: Diagnosis present

## 2017-10-30 DIAGNOSIS — I63342 Cerebral infarction due to thrombosis of left cerebellar artery: Secondary | ICD-10-CM | POA: Diagnosis not present

## 2017-10-30 DIAGNOSIS — R278 Other lack of coordination: Secondary | ICD-10-CM | POA: Diagnosis not present

## 2017-10-30 DIAGNOSIS — Z9049 Acquired absence of other specified parts of digestive tract: Secondary | ICD-10-CM

## 2017-10-30 DIAGNOSIS — Z952 Presence of prosthetic heart valve: Secondary | ICD-10-CM | POA: Diagnosis not present

## 2017-10-30 DIAGNOSIS — Z8249 Family history of ischemic heart disease and other diseases of the circulatory system: Secondary | ICD-10-CM

## 2017-10-30 DIAGNOSIS — I482 Chronic atrial fibrillation: Secondary | ICD-10-CM | POA: Diagnosis present

## 2017-10-30 DIAGNOSIS — J449 Chronic obstructive pulmonary disease, unspecified: Secondary | ICD-10-CM | POA: Diagnosis present

## 2017-10-30 DIAGNOSIS — Z9842 Cataract extraction status, left eye: Secondary | ICD-10-CM | POA: Diagnosis not present

## 2017-10-30 DIAGNOSIS — R4701 Aphasia: Secondary | ICD-10-CM | POA: Diagnosis not present

## 2017-10-30 DIAGNOSIS — F419 Anxiety disorder, unspecified: Secondary | ICD-10-CM | POA: Diagnosis present

## 2017-10-30 DIAGNOSIS — Z823 Family history of stroke: Secondary | ICD-10-CM

## 2017-10-30 DIAGNOSIS — K219 Gastro-esophageal reflux disease without esophagitis: Secondary | ICD-10-CM | POA: Diagnosis present

## 2017-10-30 DIAGNOSIS — Z8673 Personal history of transient ischemic attack (TIA), and cerebral infarction without residual deficits: Secondary | ICD-10-CM

## 2017-10-30 DIAGNOSIS — R1310 Dysphagia, unspecified: Secondary | ICD-10-CM | POA: Diagnosis not present

## 2017-10-30 DIAGNOSIS — F329 Major depressive disorder, single episode, unspecified: Secondary | ICD-10-CM | POA: Diagnosis present

## 2017-10-30 DIAGNOSIS — Z950 Presence of cardiac pacemaker: Secondary | ICD-10-CM

## 2017-10-30 DIAGNOSIS — E785 Hyperlipidemia, unspecified: Secondary | ICD-10-CM | POA: Diagnosis present

## 2017-10-30 DIAGNOSIS — Z86010 Personal history of colonic polyps: Secondary | ICD-10-CM

## 2017-10-30 DIAGNOSIS — Z7901 Long term (current) use of anticoagulants: Secondary | ICD-10-CM

## 2017-10-30 DIAGNOSIS — R2689 Other abnormalities of gait and mobility: Secondary | ICD-10-CM | POA: Diagnosis not present

## 2017-10-30 DIAGNOSIS — I340 Nonrheumatic mitral (valve) insufficiency: Secondary | ICD-10-CM | POA: Diagnosis not present

## 2017-10-30 DIAGNOSIS — H9190 Unspecified hearing loss, unspecified ear: Secondary | ICD-10-CM | POA: Diagnosis present

## 2017-10-30 DIAGNOSIS — I6340 Cerebral infarction due to embolism of unspecified cerebral artery: Secondary | ICD-10-CM | POA: Diagnosis not present

## 2017-10-30 DIAGNOSIS — F0390 Unspecified dementia without behavioral disturbance: Secondary | ICD-10-CM | POA: Diagnosis present

## 2017-10-30 DIAGNOSIS — R410 Disorientation, unspecified: Secondary | ICD-10-CM | POA: Diagnosis present

## 2017-10-30 DIAGNOSIS — Z9841 Cataract extraction status, right eye: Secondary | ICD-10-CM

## 2017-10-30 DIAGNOSIS — M6281 Muscle weakness (generalized): Secondary | ICD-10-CM | POA: Diagnosis not present

## 2017-10-30 DIAGNOSIS — I739 Peripheral vascular disease, unspecified: Secondary | ICD-10-CM | POA: Diagnosis present

## 2017-10-30 DIAGNOSIS — R4182 Altered mental status, unspecified: Secondary | ICD-10-CM | POA: Diagnosis not present

## 2017-10-30 DIAGNOSIS — E559 Vitamin D deficiency, unspecified: Secondary | ICD-10-CM | POA: Diagnosis present

## 2017-10-30 DIAGNOSIS — I4891 Unspecified atrial fibrillation: Secondary | ICD-10-CM | POA: Diagnosis present

## 2017-10-30 DIAGNOSIS — E039 Hypothyroidism, unspecified: Secondary | ICD-10-CM | POA: Diagnosis present

## 2017-10-30 DIAGNOSIS — F4489 Other dissociative and conversion disorders: Secondary | ICD-10-CM | POA: Diagnosis not present

## 2017-10-30 DIAGNOSIS — R41841 Cognitive communication deficit: Secondary | ICD-10-CM | POA: Diagnosis not present

## 2017-10-30 DIAGNOSIS — I2510 Atherosclerotic heart disease of native coronary artery without angina pectoris: Secondary | ICD-10-CM | POA: Diagnosis present

## 2017-10-30 DIAGNOSIS — I639 Cerebral infarction, unspecified: Secondary | ICD-10-CM | POA: Diagnosis not present

## 2017-10-30 DIAGNOSIS — Z87891 Personal history of nicotine dependence: Secondary | ICD-10-CM

## 2017-10-30 DIAGNOSIS — Z79899 Other long term (current) drug therapy: Secondary | ICD-10-CM

## 2017-10-30 DIAGNOSIS — L8992 Pressure ulcer of unspecified site, stage 2: Secondary | ICD-10-CM | POA: Diagnosis not present

## 2017-10-30 DIAGNOSIS — I6789 Other cerebrovascular disease: Secondary | ICD-10-CM | POA: Diagnosis not present

## 2017-10-30 DIAGNOSIS — Z888 Allergy status to other drugs, medicaments and biological substances status: Secondary | ICD-10-CM

## 2017-10-30 LAB — COMPREHENSIVE METABOLIC PANEL
ALK PHOS: 94 U/L (ref 38–126)
ALT: 11 U/L — AB (ref 14–54)
AST: 19 U/L (ref 15–41)
Albumin: 3.8 g/dL (ref 3.5–5.0)
Anion gap: 9 (ref 5–15)
BUN: 17 mg/dL (ref 6–20)
CALCIUM: 9.7 mg/dL (ref 8.9–10.3)
CHLORIDE: 105 mmol/L (ref 101–111)
CO2: 25 mmol/L (ref 22–32)
CREATININE: 0.93 mg/dL (ref 0.44–1.00)
GFR calc Af Amer: 58 mL/min — ABNORMAL LOW (ref >60)
GFR calc non Af Amer: 50 mL/min — ABNORMAL LOW (ref >60)
Glucose, Bld: 85 mg/dL (ref 65–99)
Potassium: 4 mmol/L (ref 3.5–5.1)
SODIUM: 139 mmol/L (ref 135–145)
Total Bilirubin: 1 mg/dL (ref 0.3–1.2)
Total Protein: 7.3 g/dL (ref 6.5–8.1)

## 2017-10-30 LAB — CBC WITH DIFFERENTIAL/PLATELET
BASOS ABS: 0 10*3/uL (ref 0.0–0.1)
Basophils Relative: 1 %
EOS ABS: 0.1 10*3/uL (ref 0.0–0.7)
EOS PCT: 1 %
HCT: 40.6 % (ref 36.0–46.0)
HEMOGLOBIN: 13.2 g/dL (ref 12.0–15.0)
LYMPHS ABS: 1.9 10*3/uL (ref 0.7–4.0)
Lymphocytes Relative: 23 %
MCH: 28.2 pg (ref 26.0–34.0)
MCHC: 32.5 g/dL (ref 30.0–36.0)
MCV: 86.8 fL (ref 78.0–100.0)
Monocytes Absolute: 0.9 10*3/uL (ref 0.1–1.0)
Monocytes Relative: 10 %
NEUTROS PCT: 65 %
Neutro Abs: 5.3 10*3/uL (ref 1.7–7.7)
PLATELETS: 218 10*3/uL (ref 150–400)
RBC: 4.68 MIL/uL (ref 3.87–5.11)
RDW: 15.5 % (ref 11.5–15.5)
WBC: 8.2 10*3/uL (ref 4.0–10.5)

## 2017-10-30 LAB — URINALYSIS, ROUTINE W REFLEX MICROSCOPIC
Bilirubin Urine: NEGATIVE
GLUCOSE, UA: NEGATIVE mg/dL
Hgb urine dipstick: NEGATIVE
Ketones, ur: NEGATIVE mg/dL
LEUKOCYTES UA: NEGATIVE
Nitrite: NEGATIVE
PH: 6 (ref 5.0–8.0)
PROTEIN: NEGATIVE mg/dL
Specific Gravity, Urine: 1.011 (ref 1.005–1.030)

## 2017-10-30 LAB — URINE CULTURE
MICRO NUMBER: 90453596
SPECIMEN QUALITY: ADEQUATE

## 2017-10-30 LAB — I-STAT TROPONIN, ED: Troponin i, poc: 0 ng/mL (ref 0.00–0.08)

## 2017-10-30 LAB — TSH: TSH: 1.913 u[IU]/mL (ref 0.350–4.500)

## 2017-10-30 MED ORDER — ONDANSETRON HCL 4 MG PO TABS: 4.0000 mg | ORAL_TABLET | Freq: Four times a day (QID) | ORAL | Status: DC | PRN

## 2017-10-30 MED ORDER — CLONIDINE HCL 0.1 MG PO TABS
0.1000 mg | ORAL_TABLET | Freq: Once | ORAL | Status: AC
  Administered 2017-10-30: 0.1 mg via ORAL
  Filled 2017-10-30: qty 1

## 2017-10-30 MED ORDER — ONDANSETRON HCL 4 MG/2ML IJ SOLN: 4.0000 mg | Freq: Four times a day (QID) | INTRAMUSCULAR | Status: DC | PRN

## 2017-10-30 MED ORDER — ENOXAPARIN SODIUM 30 MG/0.3ML ~~LOC~~ SOLN
30.0000 mg | SUBCUTANEOUS | Status: DC
  Administered 2017-10-30: 30 mg via SUBCUTANEOUS
  Filled 2017-10-30 (×2): qty 0.3

## 2017-10-30 MED ORDER — DEXTROSE-NACL 5-0.45 % IV SOLN
INTRAVENOUS | Status: DC
  Administered 2017-10-30 – 2017-11-03 (×5): via INTRAVENOUS

## 2017-10-30 NOTE — ED Provider Notes (Addendum)
[facility] DEPT Provider Note   CSN: [id number] Arrival date & time: 10/30/17  1422     History   Chief Complaint Chief Complaint  Patient presents with  . Aphasia    HPI [name] is a 84 y.o. female.  Chief complaint is "she quit talking on Tuesday".  HPI: 84 year old female.  Resides at assisted living at [facility].  Has been in that environment for less than 1 year.  Only states that she is normally very talkative and they noticed on Tuesday that she stopped talking.  She will safe words on occasion but does not follow or participate in conversation.  She has been seen at urgent care, as well as primary care.  Both places performed urinalysis.  Placed on one antibiotic.  This was then changed to Ceftin.  She has not improved.  On the first day of symptoms she was walking without her walker off balance and confused at her assisted living facility.  Seen by primary care on Thursday and had medications adjusted.  Family states that she has not been eating for the last 2 days as well raising their concern.  Patient is 54.  In the last year she underwent T AVR, pacemaker placement, and ORIF of hip done at hospital in [place], [facility].  History of A. fib.  Pacemaker done for AV block.  Not currently anticoagulated.  They know that her pacemaker is a [employer].  They do not know if it is MRI compatible.  Of note: Patient had CT scan by her primary care physician for confusion on 31/19 Results: No acute intracranial abnormality.  Remote right corona radiata infarct and prominent chronic microvascular changes.  Past Medical History:  Diagnosis Date  . Anxiety   . Aortic stenosis    s/p TAVR  . Arthritis   . Atrial fibrillation ([place]) 2011  . Atrioventricular block, complete ([place])    Pacemaker insertion 11/17/16, Medtronic  . COPD (chronic obstructive pulmonary disease) ([employer])   . Diverticulosis of colon   . GERD  (gastroesophageal reflux disease)    Dr. Earlean Shawl  . Hx of adenomatous colonic polyps   . Hyperlipemia   . Hypertension   . Mesenteric mass    2 cm  . Peripheral vascular disease, unspecified ([place])   . PVD (peripheral vascular disease) ([facility])    Dr. Elisabeth Cara  . SBO (small bowel obstruction) ([facility])     due to adhesions  . Stricture and stenosis of esophagus   . Thyroid disease   . Unspecified peritonitis   . Unspecified vitamin D deficiency     Patient Active Problem List   Diagnosis Date Noted  . Hypothyroid 10/11/2017  . Edema 10/04/2017  . Hearing loss 08/16/2017  . Dementia 08/16/2017  . Hyperthyroidism 06/14/2017  . Memory loss 06/14/2017  . Well adult exam 02/03/2012  . Fall against object 04/21/2011  . Hyperkalemia 12/03/2010  . Confusion 11/26/2010  . CORONARY ARTERY DISEASE 10/04/2010  . URI 09/12/2010  . ALLERGIC RHINITIS DUE TO OTHER ALLERGEN 09/12/2010  . ESOPHAGEAL STRICTURE 08/28/2010  . PERITONITIS 08/28/2010  . COLONIC POLYPS, ADENOMATOUS, HX OF 08/28/2010  . WEIGHT LOSS 07/09/2010  . DYSPHAGIA UNSPECIFIED 07/09/2010  . Abdominal pain, generalized 07/09/2010  . Aortic valve disorder 05/21/2010  . Atrial fibrillation ([place]) 05/21/2010  . HEART MURMUR, SYSTOLIC 34/74/2595  . ECCHYMOSES 04/08/2010  . Anxiety 01/24/2010  . CHEST PAIN 01/24/2010  . TOBACCO USE, QUIT 05/07/2009  . CHEST WALL PAIN 04/17/2009  .  CONTUSION OF FACE SCALP AND NECK EXCEPT EYE 04/17/2009  . DIZZINESS 03/11/2009  . CONJUNCTIVITIS 09/11/2008  . ORAL ULCER 09/11/2008  . RASH AND OTHER NONSPECIFIC SKIN ERUPTION 09/11/2008  . Pain in limb 11/02/2007  . PARESTHESIA 11/02/2007  . UNSPECIFIED VITAMIN D DEFICIENCY 08/29/2007  . NAUSEA ALONE 08/29/2007  . SBO 07/27/2007  . Dyslipidemia 02/26/2007  . Essential hypertension 02/26/2007  . PERIPHERAL VASCULAR DISEASE 02/26/2007  . COPD 02/26/2007  . GERD 02/26/2007  . DIVERTICULOSIS, COLON 02/26/2007  . ADNEXAL MASS 02/26/2007    Past  Surgical History:  Procedure Laterality Date  . ABDOMINAL ADHESION SURGERY    . APPENDECTOMY  1997   ruptured  . CARDIAC VALVE REPLACEMENT     TAVR  . CATARACT EXTRACTION, BILATERAL Bilateral 2016  . CHOLECYSTECTOMY    . COLOSTOMY CLOSURE  1997   with end-to-end coloproctostomy anstomosis. ruptured  . CORONARY ANGIOPLASTY WITH STENT PLACEMENT  2018   3 stents  . HIP SURGERY    . OMENTECTOMY     Partial with gastrostomy  . PACEMAKER IMPLANT  11/17/2016   implanted at [facility],  Medtronic [id number] Azure XT SR MRI, GB:[id number], Lead: RV Medtronic [id number]. DX Atrioventricular Block/ I44.2  . SIGMOIDECTOMY     Segemental with colostomy and Hartmann     OB History   None      Home Medications    Prior to Admission medications   Medication Sig Start Date End Date Taking? Authorizing Provider  acetaminophen (TYLENOL) 500 MG tablet Take 500 mg by mouth every 6 (six) hours as needed. For pain    [provider]  cefUROXime (CEFTIN) 250 MG tablet Take 1 tablet (250 mg total) by mouth 2 (two) times daily with a meal for 10 days. 10/27/17 11/06/17  Kara Dies, NP  Cholecalciferol (VITAMIN D3) 2000 units capsule Take 2,000 Units by mouth daily.     [provider]  donepezil (ARICEPT) 5 MG tablet Take 1 tablet (5 mg total) by mouth at bedtime. 08/16/17   Plotnikov, Evie Lacks, MD  ELIQUIS 2.5 MG TABS tablet take 1 tablet by mouth twice a day 07/15/17   Plotnikov, Evie Lacks, MD  escitalopram (LEXAPRO) 5 MG tablet Take 0.5-1 tablets (2.5-5 mg total) by mouth daily. 10/11/17   Plotnikov, Evie Lacks, MD  losartan (COZAAR) 25 MG tablet Take 1 tablet (25 mg total) by mouth 2 (two) times daily. 10/29/17   Marrian Salvage, FNP  nitroGLYCERIN (NITROSTAT) 0.4 MG SL tablet Place 0.4 mg under the tongue every 5 (five) minutes as needed. Total of 3 doses in 15 minutes.     [provider]  omeprazole (PRILOSEC) 20 MG capsule Take 20 mg by mouth daily.    [provider]  ondansetron (ZOFRAN-ODT) 4 MG disintegrating tablet Take 4 mg by mouth every 8 (eight) hours as needed for nausea or vomiting.    [provider]  pravastatin (PRAVACHOL) 20 MG tablet Take 20 mg by mouth daily.    [provider]    Family History Family History  Problem Relation Age of Onset  . Coronary artery disease Father   . Early death Father   . Hypertension Father   . Kidney disease Father   . Hypothyroidism Daughter   . Mental illness Mother   . Miscarriages / [place] Mother   . Stroke Mother   . Hypertension Other   . Colon cancer Neg Hx     Social History Social History  Tobacco Use  . Smoking status: Former Smoker    Last attempt to quit: 08/01/1963    Years since quitting: 54.2  . Smokeless tobacco: Never Used  Substance Use Topics  . Alcohol use: No  . Drug use: No     Allergies   Benicar [olmesartan medoxomil]; Ezetimibe-simvastatin; Hydrochlorothiazide; Lisinopril; and Promethazine hcl   Review of Systems Review of Systems  Unable to perform ROS: Mental status change     Physical Exam Updated Vital Signs BP (!) 184/88   Pulse (!) 59   Temp 98.5 F (36.9 C)   Resp (!) 21   SpO2 96%   Physical Exam  Constitutional: She is oriented to person, place, and time. She appears well-developed and well-nourished. No distress.  Thin.  Not emaciated.  Smiles with conversation.  Will say a few words at a time.  Can say her name.  Tells me she is at [employer] long".  Speech is broken, slow, and minimal.  HENT:  Head: Normocephalic.  Eyes: Pupils are equal, round, and reactive to light. Conjunctivae are normal. No scleral icterus.  Neck: Normal range of motion. Neck supple. No thyromegaly present.  Cardiovascular: Normal rate and regular rhythm. Exam reveals no gallop and no friction rub.  No murmur heard. Pulmonary/Chest: Effort normal and breath sounds normal. No respiratory distress. She has no wheezes. She has no  rales.  Abdominal: Soft. Bowel sounds are normal. She exhibits no distension. There is no tenderness. There is no rebound.  Musculoskeletal: Normal range of motion.  Neurological: She is alert and oriented to person, place, and time.  She will lift both arms and both legs without apparent weakness.  A aphasia seems to be expressive.  She seemed to receive well.  Had difficulty performing visual field deficits.  She blinks to threat from both sides.  She responds to my voice and will look to me on her left and right.  Skin: Skin is warm and dry. No rash noted.     ED Treatments / Results  Labs (all labs ordered are listed, but only abnormal results are displayed) Labs Reviewed  COMPREHENSIVE METABOLIC PANEL - Abnormal; Notable for the following components:      Result Value   ALT 11 (*)    GFR calc non Af Amer 50 (*)    GFR calc Af Amer 58 (*)    All other components within normal limits  URINE CULTURE  CBC WITH DIFFERENTIAL/PLATELET  URINALYSIS, ROUTINE W REFLEX MICROSCOPIC  TSH  I-STAT TROPONIN, ED    EKG None  Radiology Ct Head Wo Contrast  Result Date: 10/30/2017 CLINICAL DATA:  Expressive aphasia EXAM: CT HEAD WITHOUT CONTRAST TECHNIQUE: Contiguous axial images were obtained from the base of the skull through the vertex without intravenous contrast. COMPARISON:  October 05, 2017 FINDINGS: Brain: There is moderate diffuse atrophy. There is no intracranial mass, hemorrhage, extra-axial fluid collection, or midline shift. There is extensive small vessel disease throughout the centra semiovale bilaterally, stable. There is small vessel disease in each thalamus, internal capsule common external capsule. No acute infarct evident. Vascular: No hyperdense vessels. There is calcification in each carotid siphon. Skull: Bony calvarium appears intact. Sinuses/Orbits: Visualized paranasal sinuses are clear. Visualized orbits appear symmetric bilaterally. Other: Mastoid air cells are clear.  IMPRESSION: Atrophy with extensive supratentorial small vessel disease. No acute infarct. No mass or hemorrhage. There are foci of arterial vascular calcification. Electronically Signed   By: Lowella Grip III M.D.   On: 10/30/2017 16:22  Procedures Procedures (including critical care time)  Medications Ordered in ED Medications - No data to display   Initial Impression / Assessment and Plan / ED Course  I have reviewed the triage vital signs and the nursing notes.  Pertinent labs & imaging results that were available during my care of the patient were reviewed by me and considered in my medical decision making (see chart for details).    Expressive aphasia.  Would indicate small area of infarct with spared right upper and lower extremity.  May require MRI.  Attempting to determine if pacemaker is MRI compatible.  Meanwhile CT, repeat urine, labs, EKG.  Will reevaluate after the above.   I was able to determine serial number and make and model the patient's MRI. It was placed in [place] at [facility] 11/2016 at time of TAVR.  Medtronic model:[id number] Ventricular lead  Medtronic Model [phone] it is MRI compatible.  However it requires coordination with Medtronic tach to set it" MRI compatibility mode".  Care discussed with neurology on-call, Dr. Cheral Marker.  He did not feel this was consistent with acute stroke based on my description.  However patient remains a phasic.  She does not have obvious toxic metabolic or infectious cause.  She has normal urine.  Normal electrolytes.  Afebrile.  Discussed with Dr. Jonelle Sidle, hospitalist.  Patient will be admitted.   Final Clinical Impressions(s) / ED Diagnoses   Final diagnoses:  Aphasia    ED Discharge Orders    None       Tanna Furry, MD 10/30/17 1933    Tanna Furry, MD 10/30/17 [id number]    Tanna Furry, MD 10/30/17 2032

## 2017-10-30 NOTE — ED Triage Notes (Signed)
Her daughter and son are with her. Her daughter tells [place] pt. "has been like this since (this) Tuesday, when she was put on antibiotics for a u.t.i.". Pt. Is awake with marked expressive aphasia. They state pt., prior to Tuesday is generally verbose and oriented. Pt. Is able to guess "[facility]" when her son asks her to what hospital she was taken.

## 2017-10-30 NOTE — ED Notes (Signed)
ED TO INPATIENT HANDOFF REPORT  Name/Age/Gender [name] 84 y.o. female  Code Status    Code Status Orders  (From admission, onward)        Start     Ordered   10/30/17 1937  Full code  Continuous     10/30/17 1936    Code Status History    This patient has a current code status but no historical code status.      Home/SNF/Other Home  Chief Complaint UTI? AMS  Level of Care/Admitting Diagnosis ED Disposition    ED Disposition Condition [facility] Area: [facility] [100102]  Level of Care: Med-Surg [16]  Diagnosis: Confusion [130865]  Admitting Physician: Elwyn Reach [[id number]]  Attending Physician: Elwyn Reach [[id number]]  Estimated length of stay: past midnight tomorrow  Certification:: I certify this patient will need inpatient services for at least 2 midnights  PT Class (Do Not Modify): Inpatient [101]  PT Acc Code (Do Not Modify): Private [1]       Medical History Past Medical History:  Diagnosis Date  . Anxiety   . Aortic stenosis    s/p TAVR  . Arthritis   . Atrial fibrillation ([place]) 2011  . Atrioventricular block, complete ([place])    Pacemaker insertion 11/17/16, Medtronic  . COPD (chronic obstructive pulmonary disease) ([place])   . Diverticulosis of colon   . GERD (gastroesophageal reflux disease)    Dr. Earlean Shawl  . Hx of adenomatous colonic polyps   . Hyperlipemia   . Hypertension   . Mesenteric mass    2 cm  . Peripheral vascular disease, unspecified ([place])   . PVD (peripheral vascular disease) (HCC)    Dr. Elisabeth Cara  . SBO (small bowel obstruction) (HCC)     due to adhesions  . Stricture and stenosis of esophagus   . Thyroid disease   . Unspecified peritonitis   . Unspecified vitamin D deficiency     Allergies Allergies  Allergen Reactions  . Benicar [Olmesartan Medoxomil]     hyperkalemia  . Ezetimibe-Simvastatin     REACTION: ACHES  . Hydrochlorothiazide     REACTION: ? rash  .  Lisinopril     REACTION: cough  . Promethazine Hcl     REACTION: palpitations    IV Location/Drains/Wounds Patient Lines/Drains/Airways Status   Active Line/Drains/Airways    Name:   Placement date:   Placement time:   Site:   Days:   Peripheral IV 01/08/12 Left Antecubital   01/08/12    0551    Antecubital   2122   Peripheral IV 10/30/17 Left;Posterior Hand   10/30/17    2104    Hand   less than 1          Labs/Imaging Results for orders placed or performed during the hospital encounter of 10/30/17 (from the past 48 hour(s))  CBC with Differential/Platelet     Status: None   Collection Time: 10/30/17  3:50 PM  Result Value Ref Range   WBC 8.2 4.0 - 10.5 K/uL   RBC 4.68 3.87 - 5.11 MIL/uL   Hemoglobin 13.2 12.0 - 15.0 g/dL   HCT 40.6 36.0 - 46.0 %   MCV 86.8 78.0 - 100.0 fL   MCH 28.2 26.0 - 34.0 pg   MCHC 32.5 30.0 - 36.0 g/dL   RDW 15.5 11.5 - 15.5 %   Platelets 218 150 - 400 K/uL   Neutrophils Relative % 65 %   Neutro Abs  5.3 1.7 - 7.7 K/uL   Lymphocytes Relative 23 %   Lymphs Abs 1.9 0.7 - 4.0 K/uL   Monocytes Relative 10 %   Monocytes Absolute 0.9 0.1 - 1.0 K/uL   Eosinophils Relative 1 %   Eosinophils Absolute 0.1 0.0 - 0.7 K/uL   Basophils Relative 1 %   Basophils Absolute 0.0 0.0 - 0.1 K/uL    Comment: Performed at [facility], [place] [address] [place], [place] [address]  Comprehensive metabolic panel     Status: Abnormal   Collection Time: 10/30/17  3:50 PM  Result Value Ref Range   Sodium 139 135 - 145 mmol/L   Potassium 4.0 3.5 - 5.1 mmol/L   Chloride 105 101 - 111 mmol/L   CO2 25 22 - 32 mmol/L   Glucose, Bld 85 65 - 99 mg/dL   BUN 17 6 - 20 mg/dL   Creatinine, Ser 0.93 0.44 - 1.00 mg/dL   Calcium 9.7 8.9 - 10.3 mg/dL   Total Protein 7.3 6.5 - 8.1 g/dL   Albumin 3.8 3.5 - 5.0 g/dL   AST 19 15 - 41 U/L   ALT 11 (L) 14 - 54 U/L   Alkaline Phosphatase 94 38 - 126 U/L   Total Bilirubin 1.0 0.3 - 1.2 mg/dL   GFR calc non Af Amer 50  (L) >60 mL/min   GFR calc Af Amer 58 (L) >60 mL/min    Comment: (NOTE) The eGFR has been calculated using the CKD EPI equation. This calculation has not been validated in all clinical situations. eGFR's persistently <60 mL/min signify possible Chronic Kidney Disease.    Anion gap 9 5 - 15    Comment: Performed at [facility], [place] [address], [place], [place] [address]  TSH     Status: None   Collection Time: 10/30/17  3:52 PM  Result Value Ref Range   TSH 1.913 0.350 - 4.500 uIU/mL    Comment: Performed by a 3rd Generation assay with a functional sensitivity of <=0.01 uIU/mL. Performed at [facility], [place] [address] [place], [place] [address]   I-stat troponin, ED     Status: None   Collection Time: 10/30/17  4:01 PM  Result Value Ref Range   Troponin i, poc 0.00 0.00 - 0.08 ng/mL   Comment 3            Comment: Due to the release kinetics of cTnI, a negative result within the first hours of the onset of symptoms does not rule out myocardial infarction with certainty. If myocardial infarction is still suspected, repeat the test at appropriate intervals.   Urinalysis, Routine w reflex microscopic     Status: None   Collection Time: 10/30/17  4:40 PM  Result Value Ref Range   Color, Urine YELLOW YELLOW   APPearance CLEAR CLEAR   Specific Gravity, Urine 1.011 1.005 - 1.030   pH 6.0 5.0 - 8.0   Glucose, UA NEGATIVE NEGATIVE mg/dL   Hgb urine dipstick NEGATIVE NEGATIVE   Bilirubin Urine NEGATIVE NEGATIVE   Ketones, ur NEGATIVE NEGATIVE mg/dL   Protein, ur NEGATIVE NEGATIVE mg/dL   Nitrite NEGATIVE NEGATIVE   Leukocytes, UA NEGATIVE NEGATIVE    Comment: Performed at [facility] [address] [place], [place] [address]   Ct Head Wo Contrast  Result Date: 10/30/2017 CLINICAL DATA:  Expressive aphasia EXAM: CT HEAD WITHOUT CONTRAST TECHNIQUE: Contiguous axial images were obtained from the base of the skull  through  the vertex without intravenous contrast. COMPARISON:  October 05, 2017 FINDINGS: Brain: There is moderate diffuse atrophy. There is no intracranial mass, hemorrhage, extra-axial fluid collection, or midline shift. There is extensive small vessel disease throughout the centra semiovale bilaterally, stable. There is small vessel disease in each thalamus, internal capsule common external capsule. No acute infarct evident. Vascular: No hyperdense vessels. There is calcification in each carotid siphon. Skull: Bony calvarium appears intact. Sinuses/Orbits: Visualized paranasal sinuses are clear. Visualized orbits appear symmetric bilaterally. Other: Mastoid air cells are clear. IMPRESSION: Atrophy with extensive supratentorial small vessel disease. No acute infarct. No mass or hemorrhage. There are foci of arterial vascular calcification. Electronically Signed   By: Lowella Grip III M.D.   On: 10/30/2017 16:22    Pending Labs Unresulted Labs (From admission, onward)   Start     Ordered   11/06/17 0500  Creatinine, serum  (enoxaparin (LOVENOX)    CrCl >/= 30 ml/min)  Weekly,   R    Comments:  while on enoxaparin therapy    10/30/17 1936   10/31/17 0500  Comprehensive metabolic panel  Tomorrow morning,   R     10/30/17 1936   10/31/17 0500  CBC  Tomorrow morning,   R     10/30/17 1936   10/30/17 1937  CBC  (enoxaparin (LOVENOX)    CrCl >/= 30 ml/min)  Once,   R    Comments:  Baseline for enoxaparin therapy IF NOT ALREADY DRAWN.  Notify MD if PLT < 100 K.    10/30/17 1936   10/30/17 1937  Creatinine, serum  (enoxaparin (LOVENOX)    CrCl >/= 30 ml/min)  Once,   R    Comments:  Baseline for enoxaparin therapy IF NOT ALREADY DRAWN.    10/30/17 1936   10/30/17 1937  TSH  Once,   R     10/30/17 1936   10/30/17 1521  Urine Culture  STAT,   STAT     10/30/17 1520      Vitals/Pain Today's Vitals   10/30/17 1900 10/30/17 1930 10/30/17 2000 10/30/17 2030  BP: (!) 184/88 (!) 190/92 (!) 182/96 (!)  174/99  Pulse: (!) 59 (!) 59 (!) 59 (!) 58  Resp: (!) 21  19 (!) 24  Temp:      TempSrc:      SpO2: 96% 92% 91% 97%    Isolation Precautions No active isolations  Medications Medications  enoxaparin (LOVENOX) injection 30 mg (has no administration in time range)  dextrose 5 %-0.45 % sodium chloride infusion ( Intravenous New Bag/Given 10/30/17 2105)  ondansetron (ZOFRAN) tablet 4 mg (has no administration in time range)    Or  ondansetron (ZOFRAN) injection 4 mg (has no administration in time range)  cloNIDine (CATAPRES) tablet 0.1 mg (has no administration in time range)    Mobility walks with person assist

## 2017-10-30 NOTE — H&P (Signed)
History and Physical    [name] DOB: [birth year] DOA: 10/30/2017  Referring MD/NP/PA: Dr Tanna Furry PCP: Cassandria Anger, MD   Outpatient Specialists: none   Patient coming from: [facility]  Chief Complaint: Confusion  HPI: [name] is a 84 y.o. female with medical history significant of chronic atrial fibrillation with pacemaker in place, GERD, COPD, aortic stenosis status post aortic valve replacement, peripheral vascular disease and previous small bowel obstruction with surgeries who presented with confusion in the last 1 week. Patient was able to do her ADLs and moving around per family and to the last week. She started having confusion and weakness. She was seen by her primary care physician and treated for UTI. Patient's speech has been off since Monday and she is becoming gradually a phasic. In the ER patient appears confused and sleepy. She is arousable but she has reverted to speaking [place] which is her primary language prior to [place]. There is no focal findings. ER physician has consulted neurology who did not think she is having a CVA but there is worried that she may be having a stroke.  ED Course:patient was evaluated in the ER with head CT negative for acute CVA. Urinalysis also negative for UTI. CBC and CMP was unremarkable. Neurology was consulted but was not convinced that patient was having an acute CVA. Patient's symptoms have been going on for days pain head CT has been negative.She has a pacemaker in place but is MRI compatible Review of Systems: As per HPI otherwise 10 point review of systems negative.    Past Medical History:  Diagnosis Date  . Anxiety   . Aortic stenosis    s/p TAVR  . Arthritis   . Atrial fibrillation ([place]) 2011  . Atrioventricular block, complete ([place])    Pacemaker insertion 11/17/16, [employer]tronic  . COPD (chronic obstructive pulmonary disease) ([place])   . Diverticulosis of colon   . GERD  (gastroesophageal reflux disease)    Dr. Earlean Shawl  . Hx of adenomatous colonic polyps   . Hyperlipemia   . Hypertension   . Mesenteric mass    2 cm  . Peripheral vascular disease, unspecified ([place])   . PVD (peripheral vascular disease) (HCC)    Dr. Elisabeth Cara  . SBO (small bowel obstruction) (HCC)     due to adhesions  . Stricture and stenosis of esophagus   . Thyroid disease   . Unspecified peritonitis   . Unspecified vitamin D deficiency     Past Surgical History:  Procedure Laterality Date  . ABDOMINAL ADHESION SURGERY    . APPENDECTOMY  1997   ruptured  . CARDIAC VALVE REPLACEMENT     TAVR  . CATARACT EXTRACTION, BILATERAL Bilateral 2016  . CHOLECYSTECTOMY    . COLOSTOMY CLOSURE  1997   with end-to-end coloproctostomy anstomosis. ruptured  . CORONARY ANGIOPLASTY WITH STENT PLACEMENT  2018   3 stents  . HIP SURGERY    . OMENTECTOMY     Partial with gastrostomy  . PACEMAKER IMPLANT  11/17/2016   implanted at [facility],  Medtronic [id number] Azure XT SR MRI, RC:[id number], Lead: RV Medtronic [id number]. DX Atrioventricular Block/ I44.2  . SIGMOIDECTOMY     Segemental with colostomy and Jeanette Caprice     reports that she quit smoking about 54 years ago. She has never used smokeless tobacco. She reports that she does not drink alcohol or use drugs.  Allergies  Allergen Reactions  . Benicar [Olmesartan Medoxomil]  hyperkalemia  . Ezetimibe-Simvastatin     REACTION: ACHES  . Hydrochlorothiazide     REACTION: ? rash  . Lisinopril     REACTION: cough  . Promethazine Hcl     REACTION: palpitations    Family History  Problem Relation Age of Onset  . Coronary artery disease Father   . Early death Father   . Hypertension Father   . Kidney disease Father   . Hypothyroidism Daughter   . Mental illness Mother   . Miscarriages / [place] Mother   . Stroke Mother   . Hypertension Other   . Colon cancer Neg Hx     Prior to Admission medications   Medication Sig Start Date  End Date Taking? Authorizing Provider  acetaminophen (TYLENOL) 500 MG tablet Take 500 mg by mouth every 6 (six) hours as needed. For pain    [provider]  cefUROXime (CEFTIN) 250 MG tablet Take 1 tablet (250 mg total) by mouth 2 (two) times daily with a meal for 10 days. 10/27/17 11/06/17  Kara Dies, NP  Cholecalciferol (VITAMIN D3) 2000 units capsule Take 2,000 Units by mouth daily.     [provider]  donepezil (ARICEPT) 5 MG tablet Take 1 tablet (5 mg total) by mouth at bedtime. 08/16/17   Plotnikov, Evie Lacks, MD  ELIQUIS 2.5 MG TABS tablet take 1 tablet by mouth twice a day 07/15/17   Plotnikov, Evie Lacks, MD  escitalopram (LEXAPRO) 5 MG tablet Take 0.5-1 tablets (2.5-5 mg total) by mouth daily. 10/11/17   Plotnikov, Evie Lacks, MD  losartan (COZAAR) 25 MG tablet Take 1 tablet (25 mg total) by mouth 2 (two) times daily. 10/29/17   Marrian Salvage, FNP  nitroGLYCERIN (NITROSTAT) 0.4 MG SL tablet Place 0.4 mg under the tongue every 5 (five) minutes as needed. Total of 3 doses in 15 minutes.     [provider]  omeprazole (PRILOSEC) 20 MG capsule Take 20 mg by mouth daily.    [provider]  ondansetron (ZOFRAN-ODT) 4 MG disintegrating tablet Take 4 mg by mouth every 8 (eight) hours as needed for nausea or vomiting.    [provider]  pravastatin (PRAVACHOL) 20 MG tablet Take 20 mg by mouth daily.    [provider]    Physical Exam: Vitals:   10/30/17 1700 10/30/17 1730 10/30/17 1800 10/30/17 1830  BP: (!) 185/91 (!) 192/93 (!) 193/95 (!) 191/89  Pulse: (!) 58 60 (!) 57 (!) 58  Resp: (!) 21  (!) 39 (!) 24  Temp:      TempSrc:      SpO2: 97% 96% 97% 96%      Constitutional: NAD, calm, comfortable Vitals:   10/30/17 1700 10/30/17 1730 10/30/17 1800 10/30/17 1830  BP: (!) 185/91 (!) 192/93 (!) 193/95 (!) 191/89  Pulse: (!) 58 60 (!) 57 (!) 58  Resp: (!) 21  (!) 39 (!) 24  Temp:      TempSrc:      SpO2: 97%  96% 97% 96%   Eyes: PERRL, lids and conjunctivae normal ENMT: Mucous membranes are moist. Posterior pharynx clear of any exudate or lesions.Normal dentition.  Neck: normal, supple, no masses, no thyromegaly Respiratory: clear to auscultation bilaterally, no wheezing, no crackles. Normal respiratory effort. No accessory muscle use.  Cardiovascular: Regular rate and rhythm, no murmurs / rubs / gallops. No extremity edema. 2+ pedal pulses. No carotid bruits.  Abdomen: no tenderness, no masses palpated. No hepatosplenomegaly. Bowel sounds positive.  Musculoskeletal: no clubbing / cyanosis. No joint deformity upper and lower extremities. Good ROM, no contractures. Normal muscle tone.  Skin: no rashes, lesions, ulcers. No induration Neurologic: CN 2-12 grossly intact. Sensation intact, DTR normal. Strength 5/5 in all 4.  Psychiatric: Confused, arousable.   Labs on Admission: I have personally reviewed following labs and imaging studies  CBC: Recent Labs  Lab 10/30/17 1550  WBC 8.2  NEUTROABS 5.3  HGB 13.2  HCT 40.6  MCV 86.8  PLT 517   Basic Metabolic Panel: Recent Labs  Lab 10/30/17 1550  NA 139  K 4.0  CL 105  CO2 25  GLUCOSE 85  BUN 17  CREATININE 0.93  CALCIUM 9.7   GFR: Estimated Creatinine Clearance: 28 mL/min (by C-G formula based on SCr of 0.93 mg/dL). Liver Function Tests: Recent Labs  Lab 10/30/17 1550  AST 19  ALT 11*  ALKPHOS 94  BILITOT 1.0  PROT 7.3  ALBUMIN 3.8   No results for input(s): LIPASE, AMYLASE in the last 168 hours. No results for input(s): AMMONIA in the last 168 hours. Coagulation Profile: No results for input(s): INR, PROTIME in the last 168 hours. Cardiac Enzymes: No results for input(s): CKTOTAL, CKMB, CKMBINDEX, TROPONINI in the last 168 hours. BNP (last 3 results) Recent Labs    10/04/17 1531  PROBNP 313.0*   HbA1C: No results for input(s): HGBA1C in the last 72 hours. CBG: No results for input(s): GLUCAP in the last 168  hours. Lipid Profile: No results for input(s): CHOL, HDL, LDLCALC, TRIG, CHOLHDL, LDLDIRECT in the last 72 hours. Thyroid Function Tests: Recent Labs    10/30/17 1552  TSH 1.913   Anemia Panel: No results for input(s): VITAMINB12, FOLATE, FERRITIN, TIBC, IRON, RETICCTPCT in the last 72 hours. Urine analysis:    Component Value Date/Time   COLORURINE YELLOW 10/30/2017 1640   APPEARANCEUR CLEAR 10/30/2017 1640   LABSPEC 1.011 10/30/2017 1640   PHURINE 6.0 10/30/2017 1640   GLUCOSEU NEGATIVE 10/30/2017 1640   GLUCOSEU NEGATIVE 10/04/2017 1531   HGBUR NEGATIVE 10/30/2017 1640   BILIRUBINUR NEGATIVE 10/30/2017 1640   BILIRUBINUR neg 10/27/2017 1837   KETONESUR NEGATIVE 10/30/2017 1640   PROTEINUR NEGATIVE 10/30/2017 1640   UROBILINOGEN 0.2 10/27/2017 1837   UROBILINOGEN 0.2 10/04/2017 1531   NITRITE NEGATIVE 10/30/2017 1640   LEUKOCYTESUR NEGATIVE 10/30/2017 1640   Sepsis Labs: @LABRCNTIP (procalcitonin:4,lacticidven:4) )No results found for this or any previous visit (from the past 240 hour(s)).   Radiological Exams on Admission: Ct Head Wo Contrast  Result Date: 10/30/2017 CLINICAL DATA:  Expressive aphasia EXAM: CT HEAD WITHOUT CONTRAST TECHNIQUE: Contiguous axial images were obtained from the base of the skull through the vertex without intravenous contrast. COMPARISON:  October 05, 2017 FINDINGS: Brain: There is moderate diffuse atrophy. There is no intracranial mass, hemorrhage, extra-axial fluid collection, or midline shift. There is extensive small vessel disease throughout the centra semiovale bilaterally, stable. There is small vessel disease in each thalamus, internal capsule common external capsule. No acute infarct evident. Vascular: No hyperdense vessels. There is calcification in each carotid siphon. Skull: Bony calvarium appears intact. Sinuses/Orbits: Visualized paranasal sinuses are clear. Visualized orbits appear symmetric bilaterally. Other: Mastoid air cells are  clear. IMPRESSION: Atrophy with extensive supratentorial small vessel disease. No acute infarct. No mass or hemorrhage. There are foci of arterial vascular calcification. Electronically Signed   By: Lowella Grip III M.D.   On: 10/30/2017 16:22    EKG: Independently reviewed. It shows paced rhythm with dual-chamber pacemaker rhythm.  Assessment/Plan Principal Problem:   Confusion Active Problems:   Atrial fibrillation (HCC)   GERD    #1 acute confusion: Most likely secondary to acute illness. CVA not completely ruled out but less likely. Patient will get MRI of the brain but due to pacemaker this will need to be coordinated. It is MRI compatible. PT and OT will be consulted. If stroke is confirmed neurology will be consulted.  #2 atrial fibrillation: Patient has pain stress rhythm which is controlled. She is on eliquis. Continue home with treatment  #3 hypertension: Blood pressure is  Uncontrolled. Resume home medication and continue  #4 GERD: Continue PPIs  #5 dementia: Patient apparently diagnosed with dementia and started on Aricept. We'll continue with that now.   DVT prophylaxis: Eliquis  Code Status: Full  Family Communication: Daughters led by [name], at bedside  Disposition Plan: to be determined  Consults called: None  Admission status: inpatient  Severity of Illness: The appropriate patient status for this patient is INPATIENT. Inpatient status is judged to be reasonable and necessary in order to provide the required intensity of service to ensure the patient's safety. The patient's presenting symptoms, physical exam findings, and initial radiographic and laboratory data in the context of their chronic comorbidities is felt to place them at high risk for further clinical deterioration. Furthermore, it is not anticipated that the patient will be medically stable for discharge from the hospital within 2 midnights of admission. The following factors support the patient status  of inpatient.   " The patient's presenting symptoms include Confusion. " The worrisome physical exam findings include AMS.. " The initial radiographic and laboratory data are worrisome because of Negative CT head.. " The chronic co-morbidities include Dementia.   * I certify that at the point of admission it is my clinical judgment that the patient will require inpatient hospital care spanning beyond 2 midnights from the point of admission due to high intensity of service, high risk for further deterioration and high frequency of surveillance required.Barbette Merino MD [facility] Pager [phone]  If 7PM-7AM, please contact night-coverage [email or web address] Password [facility]  10/30/2017, 6:52 PM

## 2017-10-30 NOTE — ED Notes (Signed)
Bed: SE39 Expected date:  Expected time:  Means of arrival:  Comments: 84 yo UTI

## 2017-10-31 ENCOUNTER — Encounter (HOSPITAL_COMMUNITY): Payer: Self-pay | Admitting: *Deleted

## 2017-10-31 ENCOUNTER — Other Ambulatory Visit: Payer: Self-pay

## 2017-10-31 LAB — CBC
HEMATOCRIT: 38.4 % (ref 36.0–46.0)
Hemoglobin: 12.3 g/dL (ref 12.0–15.0)
MCH: 27.8 pg (ref 26.0–34.0)
MCHC: 32 g/dL (ref 30.0–36.0)
MCV: 86.7 fL (ref 78.0–100.0)
PLATELETS: 123 10*3/uL — AB (ref 150–400)
RBC: 4.43 MIL/uL (ref 3.87–5.11)
RDW: 15.6 % — AB (ref 11.5–15.5)
WBC: 7 10*3/uL (ref 4.0–10.5)

## 2017-10-31 LAB — COMPREHENSIVE METABOLIC PANEL
ALBUMIN: 3.2 g/dL — AB (ref 3.5–5.0)
ALT: 11 U/L — ABNORMAL LOW (ref 14–54)
AST: 16 U/L (ref 15–41)
Alkaline Phosphatase: 82 U/L (ref 38–126)
Anion gap: 9 (ref 5–15)
BILIRUBIN TOTAL: 0.8 mg/dL (ref 0.3–1.2)
BUN: 15 mg/dL (ref 6–20)
CO2: 24 mmol/L (ref 22–32)
CREATININE: 0.89 mg/dL (ref 0.44–1.00)
Calcium: 9 mg/dL (ref 8.9–10.3)
Chloride: 104 mmol/L (ref 101–111)
GFR calc Af Amer: >60 mL/min (ref >60)
GFR calc non Af Amer: 53 mL/min — ABNORMAL LOW (ref >60)
GLUCOSE: 121 mg/dL — AB (ref 65–99)
POTASSIUM: 4 mmol/L (ref 3.5–5.1)
Sodium: 137 mmol/L (ref 135–145)
TOTAL PROTEIN: 6 g/dL — AB (ref 6.5–8.1)

## 2017-10-31 LAB — TSH: TSH: 1.793 u[IU]/mL (ref 0.350–4.500)

## 2017-10-31 MED ORDER — CHLORHEXIDINE GLUCONATE 0.12 % MT SOLN
15.0000 mL | Freq: Two times a day (BID) | OROMUCOSAL | Status: DC
  Administered 2017-10-31 – 2017-11-02 (×5): 15 mL via OROMUCOSAL
  Filled 2017-10-31 (×6): qty 15

## 2017-10-31 MED ORDER — HYDRALAZINE HCL 20 MG/ML IJ SOLN
10.0000 mg | Freq: Four times a day (QID) | INTRAMUSCULAR | Status: DC | PRN
  Administered 2017-10-31 – 2017-11-02 (×3): 10 mg via INTRAVENOUS
  Filled 2017-10-31 (×3): qty 1

## 2017-10-31 MED ORDER — PRAVASTATIN SODIUM 20 MG PO TABS
20.0000 mg | ORAL_TABLET | Freq: Every evening | ORAL | Status: DC
  Administered 2017-10-31 – 2017-11-01 (×2): 20 mg via ORAL
  Filled 2017-10-31 (×2): qty 1

## 2017-10-31 MED ORDER — FLORANEX PO PACK
1.0000 g | PACK | Freq: Three times a day (TID) | ORAL | Status: DC
  Administered 2017-10-31 – 2017-11-02 (×5): 1 g via ORAL
  Filled 2017-10-31 (×12): qty 1

## 2017-10-31 MED ORDER — PANTOPRAZOLE SODIUM 40 MG PO TBEC
40.0000 mg | DELAYED_RELEASE_TABLET | Freq: Every day | ORAL | Status: DC
  Administered 2017-10-31 – 2017-11-03 (×4): 40 mg via ORAL
  Filled 2017-10-31 (×4): qty 1

## 2017-10-31 MED ORDER — ONDANSETRON 4 MG PO TBDP: 4.0000 mg | ORAL_TABLET | Freq: Three times a day (TID) | ORAL | Status: DC | PRN

## 2017-10-31 MED ORDER — ACETAMINOPHEN 500 MG PO TABS: 500.0000 mg | ORAL_TABLET | Freq: Four times a day (QID) | ORAL | Status: DC | PRN

## 2017-10-31 MED ORDER — ESCITALOPRAM OXALATE 10 MG PO TABS
10.0000 mg | ORAL_TABLET | Freq: Every day | ORAL | Status: DC
  Administered 2017-10-31 – 2017-11-03 (×4): 10 mg via ORAL
  Filled 2017-10-31 (×4): qty 1

## 2017-10-31 MED ORDER — VITAMIN D 1000 UNITS PO TABS
2000.0000 [IU] | ORAL_TABLET | Freq: Every day | ORAL | Status: DC
  Administered 2017-10-31 – 2017-11-03 (×4): 2000 [IU] via ORAL
  Filled 2017-10-31 (×4): qty 2

## 2017-10-31 MED ORDER — NITROGLYCERIN 0.4 MG SL SUBL: 0.4000 mg | SUBLINGUAL_TABLET | SUBLINGUAL | Status: DC | PRN

## 2017-10-31 MED ORDER — LOSARTAN POTASSIUM 50 MG PO TABS
25.0000 mg | ORAL_TABLET | Freq: Two times a day (BID) | ORAL | Status: DC
  Administered 2017-10-31 – 2017-11-03 (×6): 25 mg via ORAL
  Filled 2017-10-31 (×6): qty 1

## 2017-10-31 MED ORDER — ORAL CARE MOUTH RINSE: 15.0000 mL | Freq: Two times a day (BID) | OROMUCOSAL | Status: DC

## 2017-10-31 MED ORDER — DONEPEZIL HCL 10 MG PO TABS
5.0000 mg | ORAL_TABLET | Freq: Every day | ORAL | Status: DC
  Administered 2017-10-31 – 2017-11-01 (×2): 5 mg via ORAL
  Filled 2017-10-31 (×2): qty 1

## 2017-10-31 MED ORDER — APIXABAN 2.5 MG PO TABS
2.5000 mg | ORAL_TABLET | Freq: Two times a day (BID) | ORAL | Status: DC
  Administered 2017-10-31 – 2017-11-03 (×6): 2.5 mg via ORAL
  Filled 2017-10-31 (×6): qty 1

## 2017-10-31 NOTE — Progress Notes (Signed)
Patient has expressive aphasia and was unable to answer admission history questions last night. No family was at bedside.  Will ask day shift to complete when family arrives.Roderick Pee

## 2017-10-31 NOTE — Progress Notes (Signed)
Patient Demographics:    [name], is a 84 y.o. female, DOB - 06-10-1919, POE:[id number]  Admit date - 10/30/2017   Admitting Physician Elwyn Reach, MD  Outpatient Primary MD for the patient is Plotnikov, Evie Lacks, MD  LOS - 1   Chief Complaint  Patient presents with  . Aphasia        Subjective:    [name] today has no fevers, no emesis,  No chest pain, patient's daughter [name] and patient's daughter [name] at bedside  Assessment  & Plan :    Principal Problem:   Confusion Active Problems:   Atrial fibrillation [facility])   GERD  Brief summary:  84 y.o. female with medical history significant of chronic atrial fibrillation with pacemaker in place, GERD, COPD, aortic stenosis status post aortic valve replacement, peripheral vascular disease admitted on 10/30/2017 with confusion, behavioral disturbance and lethargy.  CT head without acute findings, apparently patient's pacemaker is MRI compatible, awaiting MRI brain to rule acute stroke.     Plan:- 1)Confusional Episode- ???  Etiology, workup for infection appears negative at this time, CT head without acute findings, apparently patient's pacemaker is MRI compatible, awaiting MRI brain to rule acute stroke.  Already on Eliquis, continue pravastatin   2)Chronic Atrial Fibrillation-continue Eliquis for anticoagulation, rate appears controlled at this time patient actually appears to be in sinus rhythm at this time  3)HTN-elevated BP noted, okay to restart losartan at 25 mg twice daily,,   may use IV Hydralazine 10 mg  Every 4 hours Prn for systolic blood pressure over 160 mmhg  4)Dementia-prior to admission patient was on Aricept, progressive dementia may explain #1 above if MRI brain negative for acute stroke, continue Aricept and Lexapro  5)Social/Ethics-plan of care discussed with patient 2 daughters at bedside [facility] , at  this time patient is a full code,  6)Disposition-prior to admission patient was living in independent living apartment, she will most likely need higher level of care this time around, await PT OT eval  Code Status : Full  Disposition Plan  : ?? SNF  Consults  :  PT/OT   DVT Prophylaxis  :  Eliquis  Lab Results  Component Value Date   PLT 123 (L) 10/31/2017    Inpatient Medications  Scheduled Meds: . apixaban  2.5 mg Oral BID  . chlorhexidine  15 mL Mouth Rinse BID  . cholecalciferol  2,000 Units Oral Daily  . donepezil  5 mg Oral QHS  . lactobacillus  1 g Oral TID WC  . losartan  25 mg Oral BID  . mouth rinse  15 mL Mouth Rinse q12n4p  . pantoprazole  40 mg Oral Daily  . pravastatin  20 mg Oral QPM   Continuous Infusions: . dextrose 5 % and 0.45% NaCl 100 mL/hr at 10/31/17 0654   PRN Meds:.acetaminophen, hydrALAZINE, nitroGLYCERIN, ondansetron **OR** ondansetron (ZOFRAN) IV    Anti-infectives (From admission, onward)   None        Objective:   Vitals:   10/30/17 2239 10/30/17 2321 10/31/17 0715 10/31/17 1300  BP: (!) 195/99 (!) 150/84 (!) 196/97 (!) 152/85  Pulse: (!) 59 (!) 59 63 62  Resp: (!) 22  20 18   Temp:  (!) 97.5  F (36.4 C)  (!) 97.5 F (36.4 C)  TempSrc:  Axillary  Axillary  SpO2: 99%  100% 100%    Wt Readings from Last 3 Encounters:  10/29/17 51.3 kg (113 lb 1.3 oz)  10/25/17 50.1 kg (110 lb 6.4 oz)  10/11/17 51.7 kg (114 lb)     Intake/Output Summary (Last 24 hours) at 10/31/2017 1617 Last data filed at 10/31/2017 1611 Gross per 24 hour  Intake 1791.67 ml  Output 800 ml  Net 991.67 ml     Physical Exam  Gen:- Awake Alert,  In no apparent distress, not very verbal, able to follow commands HEENT:- [place].AT, No sclera icterus Neck-Supple Neck,No JVD,.  Lungs-  CTAB , good air movement CV- S1, S2 normal, right subclavian pacemaker in situ Abd-  +ve B.Sounds, Abd Soft, No tenderness,    Extremity/Skin:- No  edema,   warm and  dry Psych-significant cognitive deficits, overall cooperative and follows commands  neuro-no new focal deficits, no tremors   Data Review:   Micro Results Recent Results (from the past 240 hour(s))  Urine Culture     Status: None   Collection Time: 10/29/17  3:45 PM  Result Value Ref Range Status   MICRO NUMBER: [id number]  Final   SPECIMEN QUALITY: ADEQUATE  Final   Sample Source URINE  Final   STATUS: FINAL  Final   Result:   Final    Multiple organisms present, each less than 10,000 CFU/mL. These organisms, commonly found on external and internal genitalia, are considered to be colonizers. No further testing performed.    Radiology Reports Ct Head Wo Contrast  Result Date: 10/30/2017 CLINICAL DATA:  Expressive aphasia EXAM: CT HEAD WITHOUT CONTRAST TECHNIQUE: Contiguous axial images were obtained from the base of the skull through the vertex without intravenous contrast. COMPARISON:  October 05, 2017 FINDINGS: Brain: There is moderate diffuse atrophy. There is no intracranial mass, hemorrhage, extra-axial fluid collection, or midline shift. There is extensive small vessel disease throughout the centra semiovale bilaterally, stable. There is small vessel disease in each thalamus, internal capsule common external capsule. No acute infarct evident. Vascular: No hyperdense vessels. There is calcification in each carotid siphon. Skull: Bony calvarium appears intact. Sinuses/Orbits: Visualized paranasal sinuses are clear. Visualized orbits appear symmetric bilaterally. Other: Mastoid air cells are clear. IMPRESSION: Atrophy with extensive supratentorial small vessel disease. No acute infarct. No mass or hemorrhage. There are foci of arterial vascular calcification. Electronically Signed   By: Lowella Grip III M.D.   On: 10/30/2017 16:22   Ct Head Wo Contrast  Result Date: 10/05/2017 CLINICAL DATA:  84 year old female with several days of confusion. No recent falls. Stroke last year. Initial  encounter. EXAM: CT HEAD WITHOUT CONTRAST TECHNIQUE: Contiguous axial images were obtained from the base of the skull through the vertex without intravenous contrast. COMPARISON:  04/16/2009 CT. FINDINGS: Brain: No intracranial hemorrhage or CT evidence of large acute infarct. Remote right corona radiata infarct. Prominent chronic microvascular changes. Global atrophy. No intracranial mass lesion noted on this unenhanced exam. Vascular: Vascular calcifications Skull: Negative Sinuses/Orbits: Visualized orbits unremarkable. Visualized paranasal sinuses clear. Other: Visualized mastoid air cells and middle ear cavities clear. IMPRESSION: No acute intracranial abnormality. Remote right corona radiata infarct and prominent chronic microvascular changes. Atrophy. Electronically Signed   By: Genia Del M.D.   On: 10/05/2017 17:56     CBC Recent Labs  Lab 10/30/17 1550 10/31/17 0049  WBC 8.2 7.0  HGB 13.2 12.3  HCT 40.6 38.4  PLT 218 123*  MCV 86.8 86.7  MCH 28.2 27.8  MCHC 32.5 32.0  RDW 15.5 15.6*  LYMPHSABS 1.9  --   MONOABS 0.9  --   EOSABS 0.1  --   BASOSABS 0.0  --     Chemistries  Recent Labs  Lab 10/30/17 1550 10/31/17 0049  NA 139 137  K 4.0 4.0  CL 105 104  CO2 25 24  GLUCOSE 85 121*  BUN 17 15  CREATININE 0.93 0.89  CALCIUM 9.7 9.0  AST 19 16  ALT 11* 11*  ALKPHOS 94 82  BILITOT 1.0 0.8   ------------------------------------------------------------------------------------------------------------------ No results for input(s): CHOL, HDL, LDLCALC, TRIG, CHOLHDL, LDLDIRECT in the last 72 hours.  Lab Results  Component Value Date   HGBA1C 7.1 (H) 05/27/2010   ------------------------------------------------------------------------------------------------------------------ Recent Labs    10/31/17 0049  TSH 1.793   ------------------------------------------------------------------------------------------------------------------ No results for input(s):  VITAMINB12, FOLATE, FERRITIN, TIBC, IRON, RETICCTPCT in the last 72 hours.  Coagulation profile No results for input(s): INR, PROTIME in the last 168 hours.  No results for input(s): DDIMER in the last 72 hours.  Cardiac Enzymes No results for input(s): CKMB, TROPONINI, MYOGLOBIN in the last 168 hours.  Invalid input(s): CK ------------------------------------------------------------------------------------------------------------------ No results found for: BNP   Roxan Hockey M.D on 10/31/2017 at 4:17 PM  Between 7am to 7pm - Pager - [phone]  After 7pm go to [email or web address] - password [email or web address]  [facility] -  Office  [phone]   Voice Recognition Viviann Spare dictation system was used to create this note, attempts have been made to correct errors. Please contact the author with questions and/or clarifications.

## 2017-11-01 LAB — URINE CULTURE: Culture: NO GROWTH

## 2017-11-01 NOTE — Evaluation (Signed)
Occupational Therapy Evaluation Patient Details Name: [name] MRN: [id number] DOB: [birth year] Today's Date: 11/01/2017    History of Present Illness  84 y.o. female with medical history significant of chronic atrial fibrillation with pacemaker in place, GERD, COPD, aortic stenosis status post aortic valve replacement, peripheral vascular disease admitted on 10/30/2017 with confusion, behavioral disturbance and lethargy   Clinical Impression   Pt admitted with above diagnosis. Pt currently with functional limitations due to the deficits listed below (see OT Problem List).  Pt will benefit from skilled OT to increase their safety and independence with ADL and functional mobility for ADL to facilitate discharge to venue listed below.      Follow Up Recommendations  SNF    Equipment Recommendations  None recommended by OT    Recommendations for Other Services       Precautions / Restrictions Precautions Precautions: Fall      Mobility Bed Mobility               General bed mobility comments: NT  Transfers                 General transfer comment: NT        ADL either performed or assessed with clinical judgement   ADL Overall ADL's : Needs assistance/impaired        Total A at this time                                General ADL Comments: Pt did not follow any commands. OT tried familiar task of using lipstick but pt did not take or attempt to use lipstick this day     Vision Patient Visual Report: No change from baseline Additional Comments: no formal assessment but pt did look at OT when spoken too as well as looking at daughter     Perception     Praxis      Pertinent Vitals/Pain Pain Assessment: Faces Faces Pain Scale: No hurt     Hand Dominance     Extremity/Trunk Assessment Upper Extremity Assessment Upper Extremity Assessment: Generalized weakness              Cognition     Overall Cognitive Status:  Impaired/Different from baseline Area of Impairment: Following commands     OT ask questions in which would have been a yes/no answer but pt did not attempt to answer. Pt did smile.                             General Comments: Pt did not follow any commands   General Comments               Home Living Family/patient expects to be discharged to:: Skilled nursing facility                                                 OT Problem List: Decreased strength;Decreased activity tolerance;Decreased safety awareness;Impaired balance (sitting and/or standing);Decreased cognition      OT Treatment/Interventions: Self-care/ADL training;DME and/or AE instruction;Cognitive remediation/compensation;Patient/family education    OT Goals(Current goals can be found in the care plan section) Acute Rehab OT Goals Patient Stated Goal: for mom to get well OT Goal Formulation: With family Time  For Goal Achievement: 11/08/17 Potential to Achieve Goals: Good ADL Goals Pt Will Perform Eating: with supervision;sitting Pt Will Perform Grooming: with supervision Pt Will Transfer to Toilet: with supervision;regular height toilet;bedside commode Pt Will Perform Toileting - Clothing Manipulation and hygiene: with min guard assist;sit to/from stand  OT Frequency: Min 2X/week   Barriers to D/C:               AM-PAC PT "6 Clicks" Daily Activity     Outcome Measure Help from another person eating meals?: Total Help from another person taking care of personal grooming?: Total Help from another person toileting, which includes using toliet, bedpan, or urinal?: Total Help from another person bathing (including washing, rinsing, drying)?: Total Help from another person to put on and taking off regular upper body clothing?: Total   6 Click Score: 5   End of Session    Activity Tolerance: Other (comment)(limited eval as pt not following commands this day) Patient left:  in bed;with call bell/phone within reach;with bed alarm set;with family/visitor present  OT Visit Diagnosis: Other abnormalities of gait and mobility (R26.89);Muscle weakness (generalized) (M62.81);Cognitive communication deficit (R41.841)                Time: 6378-5885 OT Time Calculation (min): 29 min Charges:  OT General Charges $OT Visit: 1 Visit OT Evaluation $OT Eval Moderate Complexity: 1 Mod OT Treatments $Self Care/Home Management : 8-22 mins G-Codes:     Kari Baars, [place]  Payton Mccallum D 11/01/2017, 1:00 PM

## 2017-11-01 NOTE — NC FL2 (Signed)
[place] LEVEL OF CARE SCREENING TOOL     IDENTIFICATION  Patient Name: [name] Birthdate: May 03, 1919 Sex: female Admission Date (Current Location): 10/30/2017  [facility] and [place] Number:  Herbalist and Address:  [facility],  [place] [address] [place]      Provider Number: [id number]  Attending Physician Name and Address:  Roxan Hockey, MD  Relative Name and Phone Number:       Current Level of Care: Hospital Recommended Level of Care: [place] Prior Approval Number:    Date Approved/Denied:   PASRR Number: [id number]  Discharge Plan: SNF    Current Diagnoses: Patient Active Problem List   Diagnosis Date Noted  . Hypothyroid 10/11/2017  . Edema 10/04/2017  . Hearing loss 08/16/2017  . Dementia 08/16/2017  . Hyperthyroidism 06/14/2017  . Memory loss 06/14/2017  . Well adult exam 02/03/2012  . Fall against object 04/21/2011  . Hyperkalemia 12/03/2010  . Confusion 11/26/2010  . CORONARY ARTERY DISEASE 10/04/2010  . URI 09/12/2010  . ALLERGIC RHINITIS DUE TO OTHER ALLERGEN 09/12/2010  . ESOPHAGEAL STRICTURE 08/28/2010  . PERITONITIS 08/28/2010  . COLONIC POLYPS, ADENOMATOUS, HX OF 08/28/2010  . WEIGHT LOSS 07/09/2010  . DYSPHAGIA UNSPECIFIED 07/09/2010  . Abdominal pain, generalized 07/09/2010  . Aortic valve disorder 05/21/2010  . Atrial fibrillation ([place]) 05/21/2010  . HEART MURMUR, SYSTOLIC 28/41/3244  . ECCHYMOSES 04/08/2010  . Anxiety 01/24/2010  . CHEST PAIN 01/24/2010  . TOBACCO USE, QUIT 05/07/2009  . CHEST WALL PAIN 04/17/2009  . CONTUSION OF FACE SCALP AND NECK EXCEPT EYE 04/17/2009  . DIZZINESS 03/11/2009  . CONJUNCTIVITIS 09/11/2008  . ORAL ULCER 09/11/2008  . RASH AND OTHER NONSPECIFIC SKIN ERUPTION 09/11/2008  . Pain in limb 11/02/2007  . PARESTHESIA 11/02/2007  . UNSPECIFIED VITAMIN D DEFICIENCY 08/29/2007  . NAUSEA ALONE 08/29/2007  . SBO 07/27/2007  .  Dyslipidemia 02/26/2007  . Essential hypertension 02/26/2007  . PERIPHERAL VASCULAR DISEASE 02/26/2007  . COPD 02/26/2007  . GERD 02/26/2007  . DIVERTICULOSIS, COLON 02/26/2007  . ADNEXAL MASS 02/26/2007    Orientation RESPIRATION BLADDER Height & Weight     Self, Place  Normal Incontinent Weight:   Height:     BEHAVIORAL SYMPTOMS/MOOD NEUROLOGICAL BOWEL NUTRITION STATUS      Incontinent, Continent(intermittent) Diet(heart healthy diet)  AMBULATORY STATUS COMMUNICATION OF NEEDS Skin   Extensive Assist Verbally Normal                       Personal Care Assistance Level of Assistance  Bathing, Feeding, Dressing Bathing Assistance: Limited assistance Feeding assistance: Independent Dressing Assistance: Limited assistance     Functional Limitations Info  Sight, Hearing, Speech Sight Info: Adequate Hearing Info: Adequate Speech Info: Impaired(aphasia)    SPECIAL CARE FACTORS FREQUENCY  PT (By licensed PT), OT (By licensed OT), Speech therapy     PT Frequency: 5x OT Frequency: 5x     Speech Therapy Frequency: 1x      Contractures Contractures Info: Not present    Additional Factors Info  Code Status, Allergies Code Status Info: full code Allergies Info: Benicar Olmesartan Medoxomil, Ezetimibe-simvastatin, Hydrochlorothiazide, Lisinopril, Promethazine Hcl           Current Medications (11/01/2017):  This is the current hospital active medication list Current Facility-Administered Medications  Medication Dose Route Frequency Provider Last Rate Last Dose  . acetaminophen (TYLENOL) tablet 500 mg  500 mg Oral Q6H PRN Elwyn Reach, MD      .  apixaban (ELIQUIS) tablet 2.5 mg  2.5 mg Oral BID Gala Romney L, MD   2.5 mg at 11/01/17 1025  . chlorhexidine (PERIDEX) 0.12 % solution 15 mL  15 mL Mouth Rinse BID Emokpae, Courage, MD   15 mL at 11/01/17 1024  . cholecalciferol (VITAMIN D) tablet 2,000 Units  2,000 Units Oral Daily Elwyn Reach, MD   2,000  Units at 11/01/17 1025  . dextrose 5 %-0.45 % sodium chloride infusion   Intravenous Continuous Elwyn Reach, MD 100 mL/hr at 10/31/17 1836    . donepezil (ARICEPT) tablet 5 mg  5 mg Oral QHS Elwyn Reach, MD   5 mg at 10/31/17 2124  . escitalopram (LEXAPRO) tablet 10 mg  10 mg Oral Daily Emokpae, Courage, MD   10 mg at 11/01/17 1025  . hydrALAZINE (APRESOLINE) injection 10 mg  10 mg Intravenous Q6H PRN Roxan Hockey, MD   10 mg at 11/01/17 0610  . lactobacillus (FLORANEX/LACTINEX) granules 1 g  1 g Oral TID WC Emokpae, Courage, MD   1 g at 11/01/17 1351  . losartan (COZAAR) tablet 25 mg  25 mg Oral BID Gala Romney L, MD   25 mg at 11/01/17 1025  . MEDLINE mouth rinse  15 mL Mouth Rinse q12n4p Emokpae, Courage, MD      . nitroGLYCERIN (NITROSTAT) SL tablet 0.4 mg  0.4 mg Sublingual Q5 min PRN Jonelle Sidle, Mohammad L, MD      . ondansetron (ZOFRAN) tablet 4 mg  4 mg Oral Q6H PRN Elwyn Reach, MD       Or  . ondansetron (ZOFRAN) injection 4 mg  4 mg Intravenous Q6H PRN Garba, Mohammad L, MD      . pantoprazole (PROTONIX) EC tablet 40 mg  40 mg Oral Daily Gala Romney L, MD   40 mg at 11/01/17 1025  . pravastatin (PRAVACHOL) tablet 20 mg  20 mg Oral QPM Elwyn Reach, MD   20 mg at 10/31/17 1836     Discharge Medications: Please see discharge summary for a list of discharge medications.  Relevant Imaging Results:  Relevant Lab Results:   Additional Information SS#[id number]  Nila Nephew, LCSW

## 2017-11-01 NOTE — Progress Notes (Signed)
(  Routing to [place] as an [place])  Spoke with patient's daughter today. She was admitted to hospital on Saturday because her pressure was still high, seemed more confused and not getting any better. She is currently being worked up with various testing at [facility].

## 2017-11-01 NOTE — Progress Notes (Signed)
Patient Demographics:    [name], is a 84 y.o. female, DOB - [birth year], POE:[id number]  Admit date - 10/30/2017   Admitting Physician Elwyn Reach, MD  Outpatient Primary MD for the patient is Plotnikov, Evie Lacks, MD  LOS - 2   Chief Complaint  Patient presents with  . Aphasia        Subjective:    [name] today has no fevers, no emesis,  No chest pain, patient's daughter [name] at bedside, patient appears to respond better to family members than staff  Assessment  & Plan :    Principal Problem:   Confusion Active Problems:   Atrial fibrillation [facility])   GERD  Brief Summary:  84 y.o. female with medical history significant of chronic atrial fibrillation with pacemaker in place, GERD, COPD, aortic stenosis status post aortic valve replacement, peripheral vascular disease admitted on 10/30/2017 with confusion, behavioral disturbance and lethargy.  CT head without acute findings, apparently patient's pacemaker is MRI compatible, awaiting MRI brain to rule acute stroke.     Plan:- 1)Confusional Episode- ???  Etiology, workup for infection appears negative at this time, CT head without acute findings, apparently patient's pacemaker is MRI compatible, awaiting MRI brain to rule acute stroke.  Already on Eliquis, continue pravastatin  2)Chronic Atrial Fibrillation- continue Eliquis for anticoagulation, rate appears controlled at this time patient actually appears to be in sinus rhythm at this time  3)HTN- elevated BP noted, okay to restart losartan at 25 mg twice daily,,   may use IV Hydralazine 10 mg  Every 4 hours Prn for systolic blood pressure over 160 mmhg  4)Dementia-prior to admission patient was on Aricept, progressive dementia may explain #1 above if MRI brain negative for acute stroke, continue Aricept and Lexapro  5)Social/Ethics-plan of care discussed with patient 2  daughters at bedside [facility] , at this time patient is a full code,  6)Disposition-prior to admission patient was living in independent living apartment, she will most likely need higher level of care this time around, PT evaluation appreciated, recommend skilled nursing facility placement and 24/7 supervision  Code Status : Full  Disposition Plan  : SNF  Consults  :  PT/OT  DVT Prophylaxis  :  Eliquis  Lab Results  Component Value Date   PLT 123 (L) 10/31/2017    Inpatient Medications  Scheduled Meds: . apixaban  2.5 mg Oral BID  . chlorhexidine  15 mL Mouth Rinse BID  . cholecalciferol  2,000 Units Oral Daily  . donepezil  5 mg Oral QHS  . escitalopram  10 mg Oral Daily  . lactobacillus  1 g Oral TID WC  . losartan  25 mg Oral BID  . mouth rinse  15 mL Mouth Rinse q12n4p  . pantoprazole  40 mg Oral Daily  . pravastatin  20 mg Oral QPM   Continuous Infusions: . dextrose 5 % and 0.45% NaCl 100 mL/hr at 11/01/17 1807   PRN Meds:.acetaminophen, hydrALAZINE, nitroGLYCERIN, ondansetron **OR** ondansetron (ZOFRAN) IV    Anti-infectives (From admission, onward)   None        Objective:   Vitals:   11/01/17 0700 11/01/17 0741 11/01/17 1032 11/01/17 1430  BP: (!) 94/48 100/62 136/65 132/62  Pulse: 61  60 (!) 59 61  Resp:    18  Temp:    97.9 F (36.6 C)  TempSrc:    Oral  SpO2:        Wt Readings from Last 3 Encounters:  10/29/17 51.3 kg (113 lb 1.3 oz)  10/25/17 50.1 kg (110 lb 6.4 oz)  10/11/17 51.7 kg (114 lb)     Intake/Output Summary (Last 24 hours) at 11/01/2017 1811 Last data filed at 10/31/2017 2300 Gross per 24 hour  Intake 800 ml  Output -  Net 800 ml     Physical Exam  Gen:- Awake Alert,  In no apparent distress, not very verbal, able to follow commands HEENT:- [place].AT, No sclera icterus Neck-Supple Neck,No JVD,.  Lungs-  CTAB , good air movement CV- S1, S2 normal, right subclavian pacemaker in situ Abd-  +ve B.Sounds, Abd Soft, No  tenderness,    Extremity/Skin:- No  edema,   warm and dry Psych-significant cognitive deficits, overall cooperative and follows commands  neuro-no new focal deficits, no tremors   Data Review:   Micro Results Recent Results (from the past 240 hour(s))  Urine Culture     Status: None   Collection Time: 10/29/17  3:45 PM  Result Value Ref Range Status   MICRO NUMBER: [id number]  Final   SPECIMEN QUALITY: ADEQUATE  Final   Sample Source URINE  Final   STATUS: FINAL  Final   Result:   Final    Multiple organisms present, each less than 10,000 CFU/mL. These organisms, commonly found on external and internal genitalia, are considered to be colonizers. No further testing performed.  Urine Culture     Status: None   Collection Time: 10/30/17  3:40 PM  Result Value Ref Range Status   Specimen Description   Final    URINE, CLEAN CATCH Performed at [facility], [place] [address], [place], [place] [address]    Special Requests   Final    NONE Performed at [facility], [place] [address], [place], [place] [address]    Culture   Final    NO GROWTH Performed at [facility], [place] [address], [place], [place] [address]    Report Status 11/01/2017 FINAL  Final    Radiology Reports Ct Head Wo Contrast  Result Date: 10/30/2017 CLINICAL DATA:  Expressive aphasia EXAM: CT HEAD WITHOUT CONTRAST TECHNIQUE: Contiguous axial images were obtained from the base of the skull through the vertex without intravenous contrast. COMPARISON:  October 05, 2017 FINDINGS: Brain: There is moderate diffuse atrophy. There is no intracranial mass, hemorrhage, extra-axial fluid collection, or midline shift. There is extensive small vessel disease throughout the centra semiovale bilaterally, stable. There is small vessel disease in each thalamus, internal capsule common external capsule. No acute infarct evident. Vascular: No hyperdense vessels. There is calcification in each carotid  siphon. Skull: Bony calvarium appears intact. Sinuses/Orbits: Visualized paranasal sinuses are clear. Visualized orbits appear symmetric bilaterally. Other: Mastoid air cells are clear. IMPRESSION: Atrophy with extensive supratentorial small vessel disease. No acute infarct. No mass or hemorrhage. There are foci of arterial vascular calcification. Electronically Signed   By: Lowella Grip III M.D.   On: 10/30/2017 16:22   Ct Head Wo Contrast  Result Date: 10/05/2017 CLINICAL DATA:  84 year old female with several days of confusion. No recent falls. Stroke last year. Initial encounter. EXAM: CT HEAD WITHOUT CONTRAST TECHNIQUE: Contiguous axial images were obtained from the base of the skull through the vertex without  intravenous contrast. COMPARISON:  04/16/2009 CT. FINDINGS: Brain: No intracranial hemorrhage or CT evidence of large acute infarct. Remote right corona radiata infarct. Prominent chronic microvascular changes. Global atrophy. No intracranial mass lesion noted on this unenhanced exam. Vascular: Vascular calcifications Skull: Negative Sinuses/Orbits: Visualized orbits unremarkable. Visualized paranasal sinuses clear. Other: Visualized mastoid air cells and middle ear cavities clear. IMPRESSION: No acute intracranial abnormality. Remote right corona radiata infarct and prominent chronic microvascular changes. Atrophy. Electronically Signed   By: Genia Del M.D.   On: 10/05/2017 17:56     CBC Recent Labs  Lab 10/30/17 1550 10/31/17 0049  WBC 8.2 7.0  HGB 13.2 12.3  HCT 40.6 38.4  PLT 218 123*  MCV 86.8 86.7  MCH 28.2 27.8  MCHC 32.5 32.0  RDW 15.5 15.6*  LYMPHSABS 1.9  --   MONOABS 0.9  --   EOSABS 0.1  --   BASOSABS 0.0  --     Chemistries  Recent Labs  Lab 10/30/17 1550 10/31/17 0049  NA 139 137  K 4.0 4.0  CL 105 104  CO2 25 24  GLUCOSE 85 121*  BUN 17 15  CREATININE 0.93 0.89  CALCIUM 9.7 9.0  AST 19 16  ALT 11* 11*  ALKPHOS 94 82  BILITOT 1.0 0.8    ------------------------------------------------------------------------------------------------------------------ No results for input(s): CHOL, HDL, LDLCALC, TRIG, CHOLHDL, LDLDIRECT in the last 72 hours.  Lab Results  Component Value Date   HGBA1C 7.1 (H) 05/27/2010   ------------------------------------------------------------------------------------------------------------------ Recent Labs    10/31/17 0049  TSH 1.793   ------------------------------------------------------------------------------------------------------------------ No results for input(s): VITAMINB12, FOLATE, FERRITIN, TIBC, IRON, RETICCTPCT in the last 72 hours.  Coagulation profile No results for input(s): INR, PROTIME in the last 168 hours.  No results for input(s): DDIMER in the last 72 hours.  Cardiac Enzymes No results for input(s): CKMB, TROPONINI, MYOGLOBIN in the last 168 hours.  Invalid input(s): CK ------------------------------------------------------------------------------------------------------------------ No results found for: BNP   Roxan Hockey M.D on 11/01/2017 at 6:11 PM  Between 7am to 7pm - Pager - [phone]  After 7pm go to [email or web address] - password [email or web address]  [facility] -  Office  [phone]   Voice Recognition Viviann Spare dictation system was used to create this note, attempts have been made to correct errors. Please contact the author with questions and/or clarifications.

## 2017-11-01 NOTE — Evaluation (Signed)
Physical Therapy Evaluation Patient Details Name: [name] MRN: [id number] DOB: [birth year] Today's Date: 11/01/2017   History of Present Illness   84 y.o. female with medical history significant of chronic atrial fibrillation with pacemaker in place, GERD, COPD, aortic stenosis status post aortic valve replacement, peripheral vascular disease admitted on 10/30/2017 with confusion, behavioral disturbance and lethargy  Clinical Impression  Pt admitted with above diagnosis. Pt currently with functional limitations due to the deficits listed below (see PT Problem List).  Pt will benefit from skilled PT to increase their independence and safety with mobility to allow discharge to the venue listed below.  Evaluation limited due to pt's cognition. RN reports pt trying to get OOB yesterday and able to sit up in recliner during the day.  Attempted to assist pt with mobility today and she was resistant and appeared anxious so returned to supine.  No family present.  Recommend SNF upon d/c.     Follow Up Recommendations SNF;Supervision/Assistance - 24 hour    Equipment Recommendations  None recommended by PT    Recommendations for Other Services       Precautions / Restrictions Precautions Precautions: Fall      Mobility  Bed Mobility Overal bed mobility: Needs Assistance Bed Mobility: Supine to Sit;Sit to Supine     Supine to sit: Mod assist Sit to supine: Mod assist   General bed mobility comments: assist for completing task, pt resistant to movement, assisted herself more with back to bed, encouraged sitting for taking pills from RN, pt appeared with facial expressions of anxiety with sitting and pill taking so returned to supine and pt more calm/relaxed  Transfers                 General transfer comment: NT  Ambulation/Gait                Stairs            Wheelchair Mobility    Modified Rankin (Stroke Patients Only)       Balance Overall  balance assessment: Needs assistance(not able to fully assess however high fall risk)                                           Pertinent Vitals/Pain Pain Assessment: Faces Faces Pain Scale: No hurt Pain Intervention(s): Repositioned    Home Living Family/patient expects to be discharged to:: Skilled nursing facility                 Additional Comments: unknown PLOF as pt nonverbal at this time    Prior Function           Comments: pt at least ambulatory with rollator, pt's rollator in room     Hand Dominance        Extremity/Trunk Assessment   Upper Extremity Assessment Upper Extremity Assessment: Generalized weakness    Lower Extremity Assessment Lower Extremity Assessment: Generalized weakness       Communication   Communication: Expressive difficulties  Cognition Arousal/Alertness: Awake/alert Behavior During Therapy: Anxious Overall Cognitive Status: Impaired/Different from baseline Area of Impairment: Following commands                       Following Commands: Follows one step commands inconsistently       General Comments: pt closes eyes if not engaged, only able to state  her first name otherwise remained nonverbal, facial appearance of anxiety with assisted movement      General Comments      Exercises     Assessment/Plan    PT Assessment Patient needs continued PT services  PT Problem List Decreased strength;Decreased mobility;Decreased activity tolerance;Decreased balance;Decreased knowledge of use of DME;Decreased cognition       PT Treatment Interventions DME instruction;Therapeutic activities;Gait training;Therapeutic exercise;Patient/family education;Functional mobility training;Balance training    PT Goals (Current goals can be found in the Care Plan section)  Acute Rehab PT Goals Patient Stated Goal: for mom to get well PT Goal Formulation: Patient unable to participate in goal setting Time For  Goal Achievement: 11/15/17 Potential to Achieve Goals: Fair    Frequency Min 2X/week   Barriers to discharge        Co-evaluation               AM-PAC PT "6 Clicks" Daily Activity  Outcome Measure Difficulty turning over in bed (including adjusting bedclothes, sheets and blankets)?: Unable Difficulty moving from lying on back to sitting on the side of the bed? : Unable Difficulty sitting down on and standing up from a chair with arms (e.g., wheelchair, bedside commode, etc,.)?: Unable Help needed moving to and from a bed to chair (including a wheelchair)?: Total Help needed walking in hospital room?: Total Help needed climbing 3-5 steps with a railing? : Total 6 Click Score: 6    End of Session   Activity Tolerance: Other (comment)(limited by cognition) Patient left: in bed;with bed alarm set;with call bell/phone within reach;with nursing/sitter in room Nurse Communication: Mobility status PT Visit Diagnosis: Difficulty in walking, not elsewhere classified (R26.2)    Time: 1505-6979 PT Time Calculation (min) (ACUTE ONLY): 18 min   Charges:   PT Evaluation $PT Eval Low Complexity: 1 Low     PT G CodesCarmelia Bake, PT, DPT 11/01/2017 Pager: [phone]  York Ram E 11/01/2017, 1:18 PM

## 2017-11-01 NOTE — Progress Notes (Signed)
Physical Therapy Treatment Patient Details Name: [name] MRN: [id number] DOB: February 24, 1919 Today's Date: 11/01/2017    History of Present Illness  84 y.o. female with medical history significant of chronic atrial fibrillation with pacemaker in place, GERD, COPD, aortic stenosis status post aortic valve replacement, peripheral vascular disease admitted on 10/30/2017 with confusion, behavioral disturbance and lethargy    PT Comments    RN called PT at daughter's request for pt to mobilize.   Upon arrival, daughter had stepped out of room.  Pt assisted with exercises and upon daughter's return to room, pt assisted with ambulating. Ambulation limited however due to incontinence episode.   Follow Up Recommendations  SNF;Supervision/Assistance - 24 hour     Equipment Recommendations  None recommended by PT    Recommendations for Other Services       Precautions / Restrictions Precautions Precautions: Fall Precaution Comments: incontinent    Mobility  Bed Mobility Overal bed mobility: Needs Assistance Bed Mobility: Supine to Sit     Supine to sit: Mod assist Sit to supine: Mod assist   General bed mobility comments: assist of upper and lower body for initiating and completing task  Transfers Overall transfer level: Needs assistance Equipment used: 4-wheeled walker Transfers: Sit to/from Stand Sit to Stand: Min assist         General transfer comment: multimodal cues for technique, assist for rise and steady, cues at hips for trunk flexion to initiate sitting, manual assist for hand placement  Ambulation/Gait Ambulation/Gait assistance: Min assist Ambulation Distance (Feet): 5 Feet Assistive device: 4-wheeled walker Gait Pattern/deviations: Step-through pattern;Decreased stride length;Narrow base of support     General Gait Details: assist for steadying, pt took a few steps and then incontinent of large amount of urine so recliner brought behind pt to return to  sitting for safety   Stairs             Wheelchair Mobility    Modified Rankin (Stroke Patients Only)       Balance Overall balance assessment: Needs assistance(not able to fully assess however high fall risk)                                          Cognition Arousal/Alertness: Awake/alert Behavior During Therapy: Anxious Overall Cognitive Status: Impaired/Different from baseline Area of Impairment: Following commands                       Following Commands: Follows one step commands inconsistently       General Comments: pt remains nonverbal and smiles when addressed, pt attempting to follow commands for daughter however also requiring multimodal cues       Exercises General Exercises - Lower Extremity Ankle Circles/Pumps: AAROM;Both;10 reps Quad Sets: AROM;10 reps;Both Heel Slides: (pt would not perform knee flexion however observed pt earlier this morning with flexed LEs in bed) Hip ABduction/ADduction: AAROM;10 reps;Both Straight Leg Raises: AAROM;10 reps;Both    General Comments        Pertinent Vitals/Pain Pain Assessment: Faces Faces Pain Scale: No hurt Pain Intervention(s): Limited activity within patient's tolerance;Repositioned;Monitored during session    [place] expects to be discharged to:: Skilled nursing facility               Additional Comments: unknown PLOF as pt nonverbal at this time    Prior Function  Comments: pt at least ambulatory with rollator, pt's rollator in room   PT Goals (current goals can now be found in the care plan section) Acute Rehab PT Goals Patient Stated Goal: for mom to get well PT Goal Formulation: Patient unable to participate in goal setting Time For Goal Achievement: 11/15/17 Potential to Achieve Goals: Fair Progress towards PT goals: Progressing toward goals    Frequency    Min 2X/week      PT Plan Current plan remains appropriate     Co-evaluation              AM-PAC PT "6 Clicks" Daily Activity  Outcome Measure  Difficulty turning over in bed (including adjusting bedclothes, sheets and blankets)?: Unable Difficulty moving from lying on back to sitting on the side of the bed? : Unable Difficulty sitting down on and standing up from a chair with arms (e.g., wheelchair, bedside commode, etc,.)?: Unable Help needed moving to and from a bed to chair (including a wheelchair)?: A Little Help needed walking in hospital room?: A Lot Help needed climbing 3-5 steps with a railing? : Total 6 Click Score: 9    End of Session Equipment Utilized During Treatment: Gait belt Activity Tolerance: Other (comment)(limited by cognition and incontinence) Patient left: with call bell/phone within reach;in chair;with chair alarm set;with family/visitor present Nurse Communication: Mobility status PT Visit Diagnosis: Difficulty in walking, not elsewhere classified (R26.2)     Time: 7209-4709 PT Time Calculation (min) (ACUTE ONLY): 22 min  Charges:  $Therapeutic Exercise: 8-22 mins                    G Codes:       Carmelia Bake, PT, DPT 11/01/2017 Pager: [phone]  York Ram E 11/01/2017, 4:24 PM

## 2017-11-01 NOTE — Plan of Care (Signed)
  Problem: Safety: Goal: Ability to remain free from injury will improve Outcome: Progressing   

## 2017-11-01 NOTE — Progress Notes (Signed)
Per MRI, pace maker is compatible to receive and MRI, however no time slot for exam is available today.  Anticipate test for tomorrow, Tuesday 11/02/17.  RN to contact MRI at [facility] at [phone] for appointment time in order to set up transport.

## 2017-11-01 NOTE — Clinical Social Work Note (Signed)
Clinical Social Work Assessment  Patient Details  Name: [name] MRN: [id number] Date of Birth: January 01, 1919  Date of referral:  11/01/17               Reason for consult:  (no formal consult)                Permission sought to share information with:  Family Supports Permission granted to share information::  Yes, Verbal Permission Granted  Name::     daughter [facility]  Agency::  [place]  Relationship::     Contact Information:     Housing/Transportation Living arrangements for the past 2 months:  Materials engineer, [place] of Information:  Adult Children, Medical Team Patient Interpreter Needed:  None Criminal Activity/Legal Involvement Pertinent to Current Situation/Hospitalization:  No - Comment as needed Significant Relationships:  Warehouse manager, Adult Children Lives with:  Self, Facility Resident Do you feel safe going back to the place where you live?  Yes Need for family participation in patient care:  Yes (Comment)(daughter is HPOA & pt not oriented)  Care giving concerns:  Pt lives alone in an independent living apartment for the past 5 months. Prior to that lived with her daughter since her husband passed away a few years ago. Pt has dementia and at baseline is typically oriented and able to take direction and take care of herself independently per daughters. Uses a rolling walker, they state she cannot ambulate without that. Daughters report a few weeks ago pt became increasingly confused/paranoid, was treated for UTI at PCP but symptoms did not clear. Report over the past week pt started having difficulty speaking and moving. Brought her to ED for further evaluation fearing pt was having/had had a stroke  Facilities manager / plan:  CSW met with pt and her daughters to assist with disposition planning- pt lives in independent living apartment but recommended to go to SNF for [place] [facility] at [place].  Daughters in agreement with plan and hope that  pt "will work with therapy more if she becomes less confused." Hope to have to have pt return to [place] with additional supportive resources once she completes rehab at a SNF. Daughters researching SNFs in area- provided with bed list. CSW completed FL2 and made referrals- gave pt's daughters bed offers.  Plan: SNF for [facility] rehab at [place].   Employment status:  Retired Forensic scientist:  Medicare PT Recommendations:  [place] / Referral to community resources:  [place]  Patient/Family's Response to care:  Appreciative and involved  Patient/Family's Understanding of and Emotional Response to Diagnosis, Current Treatment, and Prognosis:  Pt unable to demonstrate understanding- interacts minimally but does answer some questions. Is calm and appropriate during assessment. Daughter left room at one point and pt became a little agitated then.  Daughters both demonstrate good understanding and ask pertinent questions- report reasonable expectations and goals for therapy above.   Emotional Assessment Appearance:  Appears stated age Attitude/Demeanor/Rapport:  (alert) Affect (typically observed):  Calm Orientation:  Oriented to Self, Oriented to Place Alcohol / Substance use:  Not Applicable Psych involvement (Current and /or in the community):  No (Comment)  Discharge Needs  Concerns to be addressed:  Discharge Planning Concerns Readmission within the last 30 days:  No Current discharge risk:  Dependent with Mobility Barriers to Discharge:  Continued Medical Work up   [employer], LCSW 11/01/2017, 3:39 PM [phone]

## 2017-11-02 ENCOUNTER — Ambulatory Visit (HOSPITAL_COMMUNITY)
Admit: 2017-11-02 | Discharge: 2017-11-02 | Disposition: A | Discharge status: Home / Self Care | Payer: Medicare Other | Attending: Family Medicine | Admitting: Family Medicine

## 2017-11-02 ENCOUNTER — Ambulatory Visit (HOSPITAL_COMMUNITY): Payer: Medicare Other

## 2017-11-02 DIAGNOSIS — R410 Disorientation, unspecified: Secondary | ICD-10-CM | POA: Diagnosis not present

## 2017-11-02 DIAGNOSIS — I639 Cerebral infarction, unspecified: Secondary | ICD-10-CM | POA: Diagnosis not present

## 2017-11-02 MED ORDER — LORAZEPAM 2 MG/ML IJ SOLN
0.5000 mg | Freq: Four times a day (QID) | INTRAMUSCULAR | Status: DC | PRN
  Administered 2017-11-02: 0.5 mg via INTRAVENOUS
  Filled 2017-11-02: qty 1

## 2017-11-02 MED ORDER — ALPRAZOLAM 0.5 MG PO TABS
0.5000 mg | ORAL_TABLET | Freq: Once | ORAL | Status: DC
  Filled 2017-11-02: qty 1

## 2017-11-02 MED ORDER — ALPRAZOLAM 0.5 MG PO TABS: 0.5000 mg | ORAL_TABLET | Freq: Once | ORAL | Status: DC

## 2017-11-02 MED ORDER — LORAZEPAM 2 MG/ML IJ SOLN: 0.5000 mg | Freq: Once | INTRAMUSCULAR | Status: DC

## 2017-11-02 NOTE — Care Management Important Message (Signed)
Important Message  Patient Details  Name: ESTY AHUJA MRN: [id number] Date of Birth: 06/07/1919   Medicare Important Message Given:  Yes    Kerin Salen 11/02/2017, 10:36 AMImportant Message  Patient Details  Name: LILYAHNA SIRMON MRN: [id number] Date of Birth: July 26, 1918   Medicare Important Message Given:  Yes    Kerin Salen 11/02/2017, 10:36 AM

## 2017-11-02 NOTE — Progress Notes (Signed)
Patient Demographics:    [name], is a 84 y.o. female, DOB - 02-Nov-1918, AYT:[id number]  Admit date - 10/30/2017   Admitting Physician Elwyn Reach, MD  Outpatient Primary MD for the patient is Plotnikov, Evie Lacks, MD  LOS - 3   Chief Complaint  Patient presents with  . Aphasia        Subjective:    [name] today has no fevers, no emesis,  No chest pain, patient's daughter [name] at bedside, pt spat out Xanax given in attempt to premedicate her for MRI brain, Patient is talking more with family members   Assessment  & Plan :    Principal Problem:   Confusion Active Problems:   Atrial fibrillation [facility])   GERD  Brief Summary:  84 y.o. female with medical history significant of chronic atrial fibrillation with pacemaker in place, GERD, COPD, aortic stenosis status post aortic valve replacement, peripheral vascular disease admitted on 10/30/2017 with confusion, behavioral disturbance and lethargy.  CT head without acute findings, apparently patient's pacemaker is MRI compatible, awaiting MRI brain at [facility] to rule acute stroke.   MRI Brain w/o Contrast  to be done at [facility]. Patient has MRI compatible Pacemaker that needs to be switched to "Safe Mode" by [employer]    Plan:- 1)Confusional Episode- ???  Etiology, workup for infection appears negative at this time, CT head without acute findings, apparently patient's pacemaker is MRI compatible, awaiting MRI brain to rule acute stroke.  Already on Eliquis, continue pravastatin.  Patient is talking more with family members  2)Chronic Atrial Fibrillation-stable, continue Eliquis for anticoagulation, rate appears controlled at this time patient actually appears to be in sinus rhythm at this time  3)HTN-intermittent elevation of BP noted, c/n  losartan at 25 mg twice daily,,   may use IV Hydralazine 10 mg  Every 4 hours Prn  for systolic blood pressure over 160 mmhg  4)Dementia/Depression-prior to admission patient was on Aricept, progressive dementia may explain #1 above if MRI brain negative for acute stroke, continue Aricept and Lexapro.  According to patient's daughter she has had ECT (ElectroConvulsive Treatment) for severe depression in the past  5)Social/Ethics-plan of care discussed with patient and daughters , at this time patient is a full code,  6)Disposition-prior to admission patient was living in independent living apartment, she will most likely need higher level of care this time around, PT evaluation appreciated, recommend skilled nursing facility placement and 24/7 supervision, awaiting MRI brain  Code Status : Full  Disposition Plan  : SNF  Consults  :  PT/OT  DVT Prophylaxis  :  Eliquis  Lab Results  Component Value Date   PLT 123 (L) 10/31/2017    Inpatient Medications  Scheduled Meds: . apixaban  2.5 mg Oral BID  . chlorhexidine  15 mL Mouth Rinse BID  . cholecalciferol  2,000 Units Oral Daily  . donepezil  5 mg Oral QHS  . escitalopram  10 mg Oral Daily  . lactobacillus  1 g Oral TID WC  . LORazepam  0.5 mg Intravenous Once  . losartan  25 mg Oral BID  . mouth rinse  15 mL Mouth Rinse q12n4p  . pantoprazole  40 mg Oral Daily  . pravastatin  20 mg Oral QPM   Continuous Infusions: . dextrose 5 % and 0.45% NaCl 100 mL/hr at 11/01/17 1807   PRN Meds:.acetaminophen, hydrALAZINE, LORazepam, nitroGLYCERIN, ondansetron **OR** ondansetron (ZOFRAN) IV    Anti-infectives (From admission, onward)   None        Objective:   Vitals:   11/01/17 1812 11/01/17 2015 11/02/17 1131 11/02/17 1210  BP: (!) 158/75 (!) 158/74 (!) 198/95 (!) 193/89  Pulse: 69 (!) 59 (!) 58 60  Resp: 18 16  14   Temp: 98.6 F (37 C) 97.9 F (36.6 C)  98.1 F (36.7 C)  TempSrc: Oral Oral  Oral  SpO2: 100% 98%  97%    Wt Readings from Last 3 Encounters:  10/29/17 51.3 kg (113 lb 1.3 oz)    10/25/17 50.1 kg (110 lb 6.4 oz)  10/11/17 51.7 kg (114 lb)     Intake/Output Summary (Last 24 hours) at 11/02/2017 1715 Last data filed at 11/02/2017 1527 Gross per 24 hour  Intake 480 ml  Output 1000 ml  Net -520 ml     Physical Exam  Gen:- Awake Alert,  In no apparent distress, Patient is talking more with family members,   able to follow commands HEENT:- [place].AT, No sclera icterus Neck-Supple Neck,No JVD,.  Lungs-  CTAB , good air movement CV- S1, S2 normal, right subclavian pacemaker in situ Abd-  +ve B.Sounds, Abd Soft, No tenderness,    Extremity/Skin:- No  edema,   warm and dry Psych-significant cognitive deficits, overall cooperative and follows commands  neuro-no new focal deficits, no tremors   Data Review:   Micro Results Recent Results (from the past 240 hour(s))  Urine Culture     Status: None   Collection Time: 10/29/17  3:45 PM  Result Value Ref Range Status   MICRO NUMBER: [id number]  Final   SPECIMEN QUALITY: ADEQUATE  Final   Sample Source URINE  Final   STATUS: FINAL  Final   Result:   Final    Multiple organisms present, each less than 10,000 CFU/mL. These organisms, commonly found on external and internal genitalia, are considered to be colonizers. No further testing performed.  Urine Culture     Status: None   Collection Time: 10/30/17  3:40 PM  Result Value Ref Range Status   Specimen Description   Final    URINE, CLEAN CATCH Performed at [facility], [place] [address] [place], [place] [address]    Special Requests   Final    NONE Performed at [facility], [place] [address] [place], [place] [address]    Culture   Final    NO GROWTH Performed at [facility], [place] [address], [place], [place] [address]    Report Status 11/01/2017 FINAL  Final    Radiology Reports Ct Head Wo Contrast  Result Date: 10/30/2017 CLINICAL DATA:  Expressive aphasia EXAM: CT HEAD WITHOUT CONTRAST TECHNIQUE: Contiguous  axial images were obtained from the base of the skull through the vertex without intravenous contrast. COMPARISON:  October 05, 2017 FINDINGS: Brain: There is moderate diffuse atrophy. There is no intracranial mass, hemorrhage, extra-axial fluid collection, or midline shift. There is extensive small vessel disease throughout the centra semiovale bilaterally, stable. There is small vessel disease in each thalamus, internal capsule common external capsule. No acute infarct evident. Vascular: No hyperdense vessels. There is calcification in each carotid siphon. Skull: Bony calvarium appears intact. Sinuses/Orbits: Visualized paranasal sinuses are clear. Visualized orbits appear symmetric bilaterally. Other:  Mastoid air cells are clear. IMPRESSION: Atrophy with extensive supratentorial small vessel disease. No acute infarct. No mass or hemorrhage. There are foci of arterial vascular calcification. Electronically Signed   By: Lowella Grip III M.D.   On: 10/30/2017 16:22   Ct Head Wo Contrast  Result Date: 10/05/2017 CLINICAL DATA:  84 year old female with several days of confusion. No recent falls. Stroke last year. Initial encounter. EXAM: CT HEAD WITHOUT CONTRAST TECHNIQUE: Contiguous axial images were obtained from the base of the skull through the vertex without intravenous contrast. COMPARISON:  04/16/2009 CT. FINDINGS: Brain: No intracranial hemorrhage or CT evidence of large acute infarct. Remote right corona radiata infarct. Prominent chronic microvascular changes. Global atrophy. No intracranial mass lesion noted on this unenhanced exam. Vascular: Vascular calcifications Skull: Negative Sinuses/Orbits: Visualized orbits unremarkable. Visualized paranasal sinuses clear. Other: Visualized mastoid air cells and middle ear cavities clear. IMPRESSION: No acute intracranial abnormality. Remote right corona radiata infarct and prominent chronic microvascular changes. Atrophy. Electronically Signed   By: Genia Del M.D.   On: 10/05/2017 17:56     CBC Recent Labs  Lab 10/30/17 1550 10/31/17 0049  WBC 8.2 7.0  HGB 13.2 12.3  HCT 40.6 38.4  PLT 218 123*  MCV 86.8 86.7  MCH 28.2 27.8  MCHC 32.5 32.0  RDW 15.5 15.6*  LYMPHSABS 1.9  --   MONOABS 0.9  --   EOSABS 0.1  --   BASOSABS 0.0  --     Chemistries  Recent Labs  Lab 10/30/17 1550 10/31/17 0049  NA 139 137  K 4.0 4.0  CL 105 104  CO2 25 24  GLUCOSE 85 121*  BUN 17 15  CREATININE 0.93 0.89  CALCIUM 9.7 9.0  AST 19 16  ALT 11* 11*  ALKPHOS 94 82  BILITOT 1.0 0.8   ------------------------------------------------------------------------------------------------------------------ No results for input(s): CHOL, HDL, LDLCALC, TRIG, CHOLHDL, LDLDIRECT in the last 72 hours.  Lab Results  Component Value Date   HGBA1C 7.1 (H) 05/27/2010   ------------------------------------------------------------------------------------------------------------------ Recent Labs    10/31/17 0049  TSH 1.793   ------------------------------------------------------------------------------------------------------------------ No results for input(s): VITAMINB12, FOLATE, FERRITIN, TIBC, IRON, RETICCTPCT in the last 72 hours.  Coagulation profile No results for input(s): INR, PROTIME in the last 168 hours.  No results for input(s): DDIMER in the last 72 hours.  Cardiac Enzymes No results for input(s): CKMB, TROPONINI, MYOGLOBIN in the last 168 hours.  Invalid input(s): CK ------------------------------------------------------------------------------------------------------------------ No results found for: BNP   Roxan Hockey M.D on 11/02/2017 at 5:15 PM  Between 7am to 7pm - Pager - [phone]  After 7pm go to [email or web address] - password [email or web address]  [facility] -  Office  [phone]   Voice Recognition Viviann Spare dictation system was used to create this note, attempts have been made to correct errors. Please contact the author  with questions and/or clarifications.

## 2017-11-03 ENCOUNTER — Inpatient Hospital Stay (HOSPITAL_COMMUNITY): Payer: Medicare Other

## 2017-11-03 DIAGNOSIS — R410 Disorientation, unspecified: Secondary | ICD-10-CM | POA: Diagnosis not present

## 2017-11-03 DIAGNOSIS — F329 Major depressive disorder, single episode, unspecified: Secondary | ICD-10-CM | POA: Diagnosis not present

## 2017-11-03 DIAGNOSIS — I639 Cerebral infarction, unspecified: Secondary | ICD-10-CM

## 2017-11-03 DIAGNOSIS — I4891 Unspecified atrial fibrillation: Secondary | ICD-10-CM | POA: Diagnosis not present

## 2017-11-03 DIAGNOSIS — R41841 Cognitive communication deficit: Secondary | ICD-10-CM | POA: Diagnosis not present

## 2017-11-03 DIAGNOSIS — R278 Other lack of coordination: Secondary | ICD-10-CM | POA: Diagnosis not present

## 2017-11-03 DIAGNOSIS — R1310 Dysphagia, unspecified: Secondary | ICD-10-CM | POA: Diagnosis not present

## 2017-11-03 DIAGNOSIS — I340 Nonrheumatic mitral (valve) insufficiency: Secondary | ICD-10-CM | POA: Diagnosis not present

## 2017-11-03 DIAGNOSIS — I69320 Aphasia following cerebral infarction: Secondary | ICD-10-CM | POA: Diagnosis not present

## 2017-11-03 DIAGNOSIS — F0390 Unspecified dementia without behavioral disturbance: Secondary | ICD-10-CM | POA: Diagnosis not present

## 2017-11-03 DIAGNOSIS — M6281 Muscle weakness (generalized): Secondary | ICD-10-CM | POA: Diagnosis not present

## 2017-11-03 DIAGNOSIS — I63342 Cerebral infarction due to thrombosis of left cerebellar artery: Secondary | ICD-10-CM | POA: Diagnosis not present

## 2017-11-03 DIAGNOSIS — L8992 Pressure ulcer of unspecified site, stage 2: Secondary | ICD-10-CM | POA: Diagnosis not present

## 2017-11-03 DIAGNOSIS — R4182 Altered mental status, unspecified: Secondary | ICD-10-CM | POA: Diagnosis not present

## 2017-11-03 DIAGNOSIS — R4701 Aphasia: Secondary | ICD-10-CM | POA: Diagnosis not present

## 2017-11-03 DIAGNOSIS — Z8673 Personal history of transient ischemic attack (TIA), and cerebral infarction without residual deficits: Secondary | ICD-10-CM | POA: Diagnosis not present

## 2017-11-03 DIAGNOSIS — R2689 Other abnormalities of gait and mobility: Secondary | ICD-10-CM | POA: Diagnosis not present

## 2017-11-03 DIAGNOSIS — R488 Other symbolic dysfunctions: Secondary | ICD-10-CM | POA: Diagnosis not present

## 2017-11-03 DIAGNOSIS — I10 Essential (primary) hypertension: Secondary | ICD-10-CM | POA: Diagnosis not present

## 2017-11-03 LAB — ECHOCARDIOGRAM COMPLETE

## 2017-11-03 MED ORDER — RIVAROXABAN 15 MG PO TABS: 15.0000 mg | ORAL_TABLET | Freq: Every day | ORAL | 2 refills | Status: AC

## 2017-11-03 MED ORDER — ONDANSETRON HCL 4 MG PO TABS: 4.0000 mg | ORAL_TABLET | Freq: Four times a day (QID) | ORAL | 0 refills | Status: AC | PRN

## 2017-11-03 MED ORDER — FLORANEX PO PACK: 1.0000 g | PACK | Freq: Three times a day (TID) | ORAL | 1 refills | Status: AC

## 2017-11-03 MED ORDER — ASPIRIN EC 81 MG PO TBEC
81.0000 mg | DELAYED_RELEASE_TABLET | Freq: Every day | ORAL | Status: DC
  Administered 2017-11-03: 81 mg via ORAL
  Filled 2017-11-03: qty 1

## 2017-11-03 MED ORDER — PRAVASTATIN SODIUM 40 MG PO TABS: 40.0000 mg | ORAL_TABLET | Freq: Every evening | ORAL | 2 refills | Status: AC

## 2017-11-03 MED ORDER — ASPIRIN 81 MG PO TBEC: 81.0000 mg | DELAYED_RELEASE_TABLET | Freq: Every day | ORAL | 1 refills | Status: DC

## 2017-11-03 MED ORDER — PRAVASTATIN SODIUM 20 MG PO TABS: 40.0000 mg | ORAL_TABLET | Freq: Every evening | ORAL | Status: DC

## 2017-11-03 MED ORDER — ESCITALOPRAM OXALATE 5 MG PO TABS
5.0000 mg | ORAL_TABLET | Freq: Every day | ORAL | 5 refills | Status: AC
Start: 2017-11-03 — End: ?

## 2017-11-03 MED ORDER — ASPIRIN 81 MG PO TBEC: 81.0000 mg | DELAYED_RELEASE_TABLET | Freq: Every day | ORAL | 0 refills | Status: AC

## 2017-11-03 NOTE — Consult Note (Addendum)
Requesting Physician: Dr. Denton Brick    Chief Complaint: stroke  History obtained from:  daughter  HPI:                                                                                                                                         [name] is an 84 y.o. female with past medical history significant chronic a fibrillation with pacemaker in place, she currently is on Eliquis.  She is status post aortic valve replacement, peripheral vascular disease.  Patient apparently for the last week has been more confused than usual.  Patient was able to do ADLs moving around per family last week however she started to have confusion and weakness.  Patient was seen by her primary care physician and noted to have UTI.  Patient speech has been slightly off since Monday and becoming gradually more difficult with expressing herself.  Patient was brought to the hospital had an MRI and found cardioembolic emboli scattered throughout with the largest in her right cerebellum.  At this time she has been changed from Eliquis to [place].  Echo at this time is shown 55-60% ejection fraction with no thrombus.  Currently she is lying in bed, confused unable to tell me the date, month, and recognize her daughter.  She does have a history of dementia.  Patient continues to perseverate on Easter.  She is moving all of her extremities and face is symmetrical.  Initially patient was going to be transferred to [facility] for further stroke workup.  However patient's family does not want to have a TEE or any further workup done as she is 84 years old.  Date last known well: Unable to determine Time last known well: Unable to determine tPA Given: No: out of window Modified Rankin: Rankin Score=4   Past Medical History:  Diagnosis Date  . Anxiety   . Aortic stenosis    s/p TAVR  . Arthritis   . Atrial fibrillation ([place]) 2011  . Atrioventricular block, complete ([place])    Pacemaker insertion 11/17/16, [employer]  .  COPD (chronic obstructive pulmonary disease) ([place])   . Diverticulosis of colon   . GERD (gastroesophageal reflux disease)    Dr. Earlean Shawl  . Hx of adenomatous colonic polyps   . Hyperlipemia   . Hypertension   . Mesenteric mass    2 cm  . Peripheral vascular disease, unspecified ([place])   . PVD (peripheral vascular disease) ([facility])    Dr. Elisabeth Cara  . SBO (small bowel obstruction) ([facility])     due to adhesions  . Stricture and stenosis of esophagus   . Thyroid disease   . Unspecified peritonitis   . Unspecified vitamin D deficiency     Past Surgical History:  Procedure Laterality Date  . ABDOMINAL ADHESION SURGERY    . APPENDECTOMY  1997   ruptured  . CARDIAC VALVE REPLACEMENT  TAVR  . CATARACT EXTRACTION, BILATERAL Bilateral 2016  . CHOLECYSTECTOMY    . COLOSTOMY CLOSURE  1997   with end-to-end coloproctostomy anstomosis. ruptured  . CORONARY ANGIOPLASTY WITH STENT PLACEMENT  2018   3 stents  . HIP SURGERY    . OMENTECTOMY     Partial with gastrostomy  . PACEMAKER IMPLANT  11/17/2016   implanted at [facility],  Medtronic [id number] Azure XT SR MRI, JX:[id number], Lead: RV Medtronic [id number]. DX Atrioventricular Block/ I44.2  . SIGMOIDECTOMY     Segemental with colostomy and Hartmann    Family History  Problem Relation Age of Onset  . Coronary artery disease Father   . Early death Father   . Hypertension Father   . Kidney disease Father   . Hypothyroidism Daughter   . Mental illness Mother   . Miscarriages / [place] Mother   . Stroke Mother   . Hypertension Other   . Colon cancer Neg Hx          Social History:  reports that she quit smoking about 54 years ago. She has never used smokeless tobacco. She reports that she does not drink alcohol or use drugs.  Allergies:  Allergies  Allergen Reactions  . Benicar [Olmesartan Medoxomil]     hyperkalemia  . Ezetimibe-Simvastatin     REACTION: ACHES  . Hydrochlorothiazide     REACTION: ? rash  . Lisinopril      REACTION: cough  . Promethazine Hcl     REACTION: palpitations    Medications:                                                                                                                           Prior to Admission:  Medications Prior to Admission  Medication Sig Dispense Refill Last Dose  . cefUROXime (CEFTIN) 250 MG tablet Take 1 tablet (250 mg total) by mouth 2 (two) times daily with a meal for 10 days. 20 tablet 0 10/30/2017 at Unknown time  . Cholecalciferol (VITAMIN D3) 2000 units capsule Take 2,000 Units by mouth daily.    10/30/2017 at Unknown time  . donepezil (ARICEPT) 5 MG tablet Take 1 tablet (5 mg total) by mouth at bedtime. 30 tablet 5 10/29/2017 at Unknown time  . ELIQUIS 2.5 MG TABS tablet take 1 tablet by mouth twice a day 180 tablet 3 10/30/2017 at 0900  . losartan (COZAAR) 25 MG tablet Take 1 tablet (25 mg total) by mouth 2 (two) times daily. 60 tablet 2 10/30/2017 at Unknown time  . omeprazole (PRILOSEC) 20 MG capsule Take 20 mg by mouth daily.   10/30/2017 at Unknown time  . pravastatin (PRAVACHOL) 20 MG tablet Take 20 mg by mouth every evening.    10/29/2017 at Unknown time  . acetaminophen (TYLENOL) 500 MG tablet Take 500 mg by mouth every 6 (six) hours as needed. For pain   unknown  . nitroGLYCERIN (NITROSTAT) 0.4 MG SL tablet Place 0.4 mg under the tongue  every 5 (five) minutes as needed. Total of 3 doses in 15 minutes.    unknown  . ondansetron (ZOFRAN-ODT) 4 MG disintegrating tablet Take 4 mg by mouth every 8 (eight) hours as needed for nausea or vomiting.   unknown  . [DISCONTINUED] escitalopram (LEXAPRO) 5 MG tablet Take 0.5-1 tablets (2.5-5 mg total) by mouth daily. (Patient not taking: Reported on 10/30/2017) 30 tablet 5 Not Taking at Unknown time   Scheduled: . ALPRAZolam  0.5 mg Oral Once  . apixaban  2.5 mg Oral BID  . aspirin EC  81 mg Oral Daily  . chlorhexidine  15 mL Mouth Rinse BID  . cholecalciferol  2,000 Units Oral Daily  . donepezil  5 mg Oral QHS   . escitalopram  10 mg Oral Daily  . lactobacillus  1 g Oral TID WC  . LORazepam  0.5 mg Intravenous Once  . losartan  25 mg Oral BID  . mouth rinse  15 mL Mouth Rinse q12n4p  . pantoprazole  40 mg Oral Daily  . pravastatin  40 mg Oral QPM   Continuous: . dextrose 5 % and 0.45% NaCl 50 mL/hr at 11/03/17 1354   ZOX:[id number], hydrALAZINE, LORazepam, nitroGLYCERIN, ondansetron **OR** ondansetron (ZOFRAN) IV  ROS:                                                                                                                                       History obtained from unobtainable from patient due to mental status     General Examination:                                                                                                      Blood pressure (!) 158/86, pulse 62, temperature (!) 97.5 F (36.4 C), temperature source Oral, resp. rate 18, SpO2 100 %.  HEENT-  Normocephalic, no lesions, without obvious abnormality.  Normal external eye and conjunctiva.   Cardiovascular-  pulses palpable throughout   Lungs-no rhonchi or wheezing noted, no excessive working breathing.  Saturations within normal limits Abdomen- All 4 quadrants palpated and nontender Extremities- Warm, dry and intact Musculoskeletal-no joint tenderness, deformity or swelling Skin-lower extremities have multiple bruises at the shins and ankles  Neurological Examination Mental Status: Patient is very drowsy however awakens to sternal rub.  She does look around the room, however does not follow commands and does not recognize her daughter when her daughter asked who she is.  She does not follow simple commands such as  showing me her thumb.  She does blink to threat bilaterally.  Patient is perseverating on the word "Easter" Cranial Nerves: II: Wounds to threat bilaterally III,IV, VI: ptosis not present, extra-ocular motions intact bilaterally, pupils equal, round, reactive to light and accommodation V,VII: smile  symmetric, facial light touch sensation normal bilaterally VIII: hearing normal bilaterally IX,X: uvula rises symmetrically XI: bilateral shoulder shrug XII: midline tongue extension Motor: Moving all extremities antigravity without any difficulty Sensory: Pinprick and light touch intact throughout, bilaterally Deep Tendon Reflexes: 2+ and symmetric throughout Plantars: Right: downgoing   Left: downgoing Cerebellar: Unable to test secondary to confusion Gait: Not tested Lab Results: Basic Metabolic Panel: Recent Labs  Lab 10/30/17 1550 10/31/17 0049  NA 139 137  K 4.0 4.0  CL 105 104  CO2 25 24  GLUCOSE 85 121*  BUN 17 15  CREATININE 0.93 0.89  CALCIUM 9.7 9.0    CBC: Recent Labs  Lab 10/30/17 1550 10/31/17 0049  WBC 8.2 7.0  NEUTROABS 5.3  --   HGB 13.2 12.3  HCT 40.6 38.4  MCV 86.8 86.7  PLT 218 123*  LDL from November 2018-110 No A1c available.  Imaging: Mr Virgel Paling VH Contrast  Result Date: 11/02/2017 CLINICAL DATA:  Confusion.  Atrial fibrillation. EXAM: MRI HEAD WITHOUT CONTRAST MRA HEAD WITHOUT CONTRAST TECHNIQUE: Multiplanar, multiecho pulse sequences of the brain and surrounding structures were obtained without intravenous contrast. Angiographic images of the head were obtained using MRA technique without contrast. COMPARISON:  Head CT 10/30/2017 FINDINGS: MRI HEAD FINDINGS The study is motion degraded throughout, with some sequences being moderately to severely degraded. Brain: There is a small acute infarct in the right cerebellum. Punctate foci of mild trace diffusion hyperintensity in the left frontal white matter and right body of the corpus callosum also likely represent acute infarcts. A chronic microhemorrhage is noted in the right parietal lobe. There is moderate cerebral atrophy. Patchy to confluent T2 hyperintensities throughout the cerebral white matter and pons are nonspecific but compatible with moderately extensive chronic small vessel ischemic  disease. There are small chronic infarcts in the left cerebellum. Vascular: Major intracranial vascular flow voids are preserved. Skull and upper cervical spine: No suspicious marrow lesion. Sinuses/Orbits: Bilateral cataract extraction. Paranasal sinuses and mastoid air cells are clear. Other: None. MRA HEAD FINDINGS The study is severely motion degraded and only marginally diagnostic. There is gross flow in the distal intracranial vertebral arteries with the left appearing mildly dominant. The basilar artery is patent without evidence of significant stenosis allowing for focal motion artifact through its midportion. The PCAs are grossly patent with motion artifact precluding assessment for stenosis. The internal carotid arteries are patent from skull base to carotid termini without convincing high-grade stenosis allowing for motion artifact. The ACAs and MCAs are grossly patent with signal loss in both distal M1 segments favored to be artifactual given the presence of flow in more distal branch vessels. IMPRESSION: 1. Small acute infarcts in the right cerebellum, left frontal white matter, and corpus callosum. 2. Moderately extensive chronic small vessel ischemic disease. 3. Chronic left cerebellar infarcts. 4. Severely motion degraded head MRA.  Patent large vessels. Electronically Signed   By: Logan Bores M.D.   On: 11/02/2017 19:32   Mr Brain Wo Contrast  Result Date: 11/02/2017 CLINICAL DATA:  Confusion.  Atrial fibrillation. EXAM: MRI HEAD WITHOUT CONTRAST MRA HEAD WITHOUT CONTRAST TECHNIQUE: Multiplanar, multiecho pulse sequences of the brain and surrounding structures were obtained without intravenous contrast. Angiographic images of  the head were obtained using MRA technique without contrast. COMPARISON:  Head CT 10/30/2017 FINDINGS: MRI HEAD FINDINGS The study is motion degraded throughout, with some sequences being moderately to severely degraded. Brain: There is a small acute infarct in the right  cerebellum. Punctate foci of mild trace diffusion hyperintensity in the left frontal white matter and right body of the corpus callosum also likely represent acute infarcts. A chronic microhemorrhage is noted in the right parietal lobe. There is moderate cerebral atrophy. Patchy to confluent T2 hyperintensities throughout the cerebral white matter and pons are nonspecific but compatible with moderately extensive chronic small vessel ischemic disease. There are small chronic infarcts in the left cerebellum. Vascular: Major intracranial vascular flow voids are preserved. Skull and upper cervical spine: No suspicious marrow lesion. Sinuses/Orbits: Bilateral cataract extraction. Paranasal sinuses and mastoid air cells are clear. Other: None. MRA HEAD FINDINGS The study is severely motion degraded and only marginally diagnostic. There is gross flow in the distal intracranial vertebral arteries with the left appearing mildly dominant. The basilar artery is patent without evidence of significant stenosis allowing for focal motion artifact through its midportion. The PCAs are grossly patent with motion artifact precluding assessment for stenosis. The internal carotid arteries are patent from skull base to carotid termini without convincing high-grade stenosis allowing for motion artifact. The ACAs and MCAs are grossly patent with signal loss in both distal M1 segments favored to be artifactual given the presence of flow in more distal branch vessels. IMPRESSION: 1. Small acute infarcts in the right cerebellum, left frontal white matter, and corpus callosum. 2. Moderately extensive chronic small vessel ischemic disease. 3. Chronic left cerebellar infarcts. 4. Severely motion degraded head MRA.  Patent large vessels. Electronically Signed   By: Logan Bores M.D.   On: 11/02/2017 19:32    Assessment and plan discussed with with attending physician and they are in agreement.    Etta Quill PA-C [facility] [phone]  11/03/2017, 3:43 PM   Assessment: 84 y.o. female resented to the hospital with confusion.  Patient had been treated for urinary tract infection as an outpatient however question if this has been only partially treated.  MRI did show multiple cardioembolic infarcts with the right cerebellum, left frontal white matter, and corpus callosum.  Patient does have moderately extensive chronic small vessel ischemic changes and left cerebellar infarcts that are chronic. Most likely acute ischemic stroke, likely cardio embolic.  Stroke Risk Factors - hyperlipidemia and hypertension  Recommend --Check hemoglobin A1c -goal to keep less than 6.5. --PT consult, OT consult, Speech consult --Echocardiogram--done showing no abnormalities --On Pravachol 40.  Consider increasing to max dose for goal LDL of less than 70. --Was on Eliquis at home.  Switch to Xarelto-pharmacy to dose. --Telemetry monitoring --Frequent neuro checks --NPO until passes stroke swallow screen  Neurology service will sign off at this time.  Please call with further questions as needed. Patient and family was given the option to be transferred to [facility], complains of stroke center, but they prefer to stay over at [facility].  Since the workup is all completed, I am agreeable to not transferring the patient to [facility]. This was discussed in detail with the patient's daughter at bedside. Plan was communicated to the attending hospitalist.  Attending Neurohospitalist Addendum Patient seen and examined with APP/Resident. Agree with the history and physical as documented above. Agree with the plan as documented, which I helped formulate. I have independently reviewed the chart,  obtained history, review of systems and examined the patient.I have personally reviewed pertinent head/neck/spine imaging (CT/MRI). I have made edits to the note to reflect my assessment and  plan. Please feel free to call with any questions. --- Amie Portland, MD [facility] Pager: [phone]  If 7pm to 7am, please call on call as listed on AMION.

## 2017-11-03 NOTE — Progress Notes (Signed)
  Echocardiogram 2D Echocardiogram has been performed.  Darlina Sicilian M 11/03/2017, 12:29 PM

## 2017-11-03 NOTE — Clinical Social Work Placement (Signed)
    3:53 PM Patient and family chose bed at [facility].   LCSW confirmed bed. Room 1201P.  LCSW faxed dc docs to facility.   LCSW notified family of transfer.   Patient will transport by [facility].  RN report #: [id number]  BK[facility]  CLINICAL SOCIAL WORK PLACEMENT  NOTE  Date:  11/03/2017  Patient Details  Name: [name] MRN: [id number] Date of Birth: 1918/12/29  Clinical Social Work is seeking post-discharge placement for this patient at the [place] level of care (*CSW will initial, date and re-position this form in  chart as items are completed):  Yes   Patient/family provided with [place] Work Department's list of facilities offering this level of care within the geographic area requested by the patient (or if unable, by the patient's family).  Yes   Patient/family informed of their freedom to choose among providers that offer the needed level of care, that participate in Medicare, Medicaid or managed care program needed by the patient, have an available bed and are willing to accept the patient.  Yes   Patient/family informed of [place]'s ownership interest in [facility] and [facility], as well as of the fact that they are under no obligation to receive care at these facilities.  PASRR submitted to EDS on       PASRR number received on       Existing PASRR number confirmed on 11/01/17     FL2 transmitted to all facilities in geographic area requested by pt/family on 11/01/17     FL2 transmitted to all facilities within larger geographic area on       Patient informed that his/her managed care company has contracts with or will negotiate with certain facilities, including the following:        Yes   Patient/family informed of bed offers received.  Patient chooses bed at [facility]     Physician recommends and patient chooses bed at      Patient to be transferred to [facility] on 11/03/17.  Patient to be  transferred to facility by EMS     Patient family notified on 11/03/17 of transfer.  Name of family member notified:  daughters Merri and [place]       Additional Comment:    _______________________________________________ Servando Snare, LCSW 11/03/2017, 3:53 PM

## 2017-11-03 NOTE — Progress Notes (Signed)
Daughter at bedside requesting that nurse print a copy of the MRI report. Nurse informed patient that copies of results from the medical records should be obtained through medical records. Daughter verbalized understanding.

## 2017-11-03 NOTE — Clinical Social Work Placement (Signed)
Pt admitting to [facility] for [place] rehab- report [phone]. Will arrange [facility] transportation once pt ready for DC. Daughters at bedside bringing pt belongings to SNF. Will provide DC information via the HUB once prepared.    CLINICAL SOCIAL WORK PLACEMENT  NOTE  Date:  11/03/2017  Patient Details  Name: [name] MRN: [id number] Date of Birth: [birth year]  Clinical Social Work is seeking post-discharge placement for this patient at the [place] level of care (*CSW will initial, date and re-position this form in  chart as items are completed):  Yes   Patient/family provided with [place] Work Department's list of facilities offering this level of care within the geographic area requested by the patient (or if unable, by the patient's family).  Yes   Patient/family informed of their freedom to choose among providers that offer the needed level of care, that participate in Medicare, Medicaid or managed care program needed by the patient, have an available bed and are willing to accept the patient.  Yes   Patient/family informed of [place]'s ownership interest in [facility] and [facility], as well as of the fact that they are under no obligation to receive care at these facilities.  PASRR submitted to EDS on       PASRR number received on       Existing PASRR number confirmed on 11/01/17     FL2 transmitted to all facilities in geographic area requested by pt/family on 11/01/17     FL2 transmitted to all facilities within larger geographic area on       Patient informed that his/her managed care company has contracts with or will negotiate with certain facilities, including the following:        Yes   Patient/family informed of bed offers received.  Patient chooses bed at       Physician recommends and patient chooses bed at      Patient to be transferred to   on  .  Patient to be transferred to facility by        Patient family notified on   of transfer.  Name of family member notified:  daughters Merri and [place]       Additional Comment:    _______________________________________________ Nila Nephew, LCSW 11/03/2017, 10:15 AM

## 2017-11-03 NOTE — Progress Notes (Signed)
Physical Therapy Treatment Patient Details Name: [name] MRN: [id number] DOB: 1918/09/18 Today's Date: 11/03/2017    History of Present Illness  84 y.o. female with medical history significant of chronic atrial fibrillation with pacemaker in place, GERD, COPD, aortic stenosis status post aortic valve replacement, peripheral vascular disease admitted on 10/30/2017 with confusion, behavioral disturbance and lethargy    PT Comments    Patient's cognitive status limiting patient's progress in PT. Patient was able to sit-to-stand, stand pivot and ambulate with assistance and RW.  Pt currently with functional limitations due to the deficits listed below (see PT Problem List).  Pt will benefit from skilled PT to increase their independence and safety with mobility to allow discharge to the venue listed below.      Follow Up Recommendations  SNF;Supervision/Assistance - 24 hour     Equipment Recommendations  None recommended by PT    Recommendations for Other Services       Precautions / Restrictions Precautions Precautions: Fall Precaution Comments: incontinent Restrictions Weight Bearing Restrictions: No    Mobility  Bed Mobility Overal bed mobility: Needs Assistance         Sit to supine: Mod assist   General bed mobility comments: assist of upper and lower body for initiating and completing task  Transfers Overall transfer level: Needs assistance Equipment used: Rolling walker (2 wheeled) Transfers: Sit to/from [facility] Sit to Stand: Mod assist(resistive posterior lean upon standing.) Stand pivot transfers: Mod assist       General transfer comment: multimodal cues for technique, assist for rise and steady, cues at hips for trunk flexion to initiate sitting, manual assist for hand placement  Ambulation/Gait Ambulation/Gait assistance: Min assist Ambulation Distance (Feet): 5 Feet Assistive device: Rolling walker (2 wheeled) Gait  Pattern/deviations: Step-through pattern;Decreased stride length;Narrow base of support Gait velocity: resistive, decreased   General Gait Details: assist for steadying and moving RW from chair to bed as unable to follow direction of where to walk to.   Stairs             Wheelchair Mobility    Modified Rankin (Stroke Patients Only)       Balance Overall balance assessment: Needs assistance(not able to fully assess however high fall risk) Sitting-balance support: Bilateral upper extremity supported Sitting balance-Leahy Scale: Fair   Postural control: Posterior lean Standing balance support: Bilateral upper extremity supported;During functional activity Standing balance-Leahy Scale: Poor                              Cognition Arousal/Alertness: Lethargic Behavior During Therapy: Agitated Overall Cognitive Status: No family/caregiver present to determine baseline cognitive functioning                                 General Comments: Somewhat difficult to arouse patient at first. Became more alert with activity (upon standing), required tactile cuing, did not respond to verbal cuing.      Exercises      General Comments        Pertinent Vitals/Pain Pain Assessment: Faces Faces Pain Scale: No hurt    Home Living Family/patient expects to be discharged to:: Skilled nursing facility               Additional Comments: unknown PLOF as pt nonverbal at this time    Prior Function  Comments: rollator no longer in room   PT Goals (current goals can now be found in the care plan section) Progress towards PT goals: Progressing toward goals    Frequency    Min 2X/week      PT Plan      Co-evaluation              AM-PAC PT "6 Clicks" Daily Activity  Outcome Measure  Difficulty turning over in bed (including adjusting bedclothes, sheets and blankets)?: Unable Difficulty moving from lying on back to sitting on  the side of the bed? : A Lot Difficulty sitting down on and standing up from a chair with arms (e.g., wheelchair, bedside commode, etc,.)?: A Lot Help needed moving to and from a bed to chair (including a wheelchair)?: A Lot Help needed walking in hospital room?: A Lot Help needed climbing 3-5 steps with a railing? : Total 6 Click Score: 10    End of Session Equipment Utilized During Treatment: Gait belt Activity Tolerance: Other (comment)(treatment limited by cognition.) Patient left: in bed;with call bell/phone within reach Nurse Communication: Mobility status PT Visit Diagnosis: Difficulty in walking, not elsewhere classified (R26.2)     Time: 8016-5537 PT Time Calculation (min) (ACUTE ONLY): 20 min  Charges:  $Gait Training: 8-22 mins $Therapeutic Activity: 8-22 mins                    G Codes:       [name] Imran D. Hartnett-Rands, MS, PT [facility] [place] [phone] 11/03/2017, 11:12 AM

## 2017-11-03 NOTE — Discharge Instructions (Signed)
1) take Xarelto 15 mg with supper daily in place of Eliquis for secondary stroke prevention due to history of atrial fibrillation with multiple acute strokes 2) increase pravastatin to 40 mg every evening to reduce risk of further strokes 3) consider adding amlodipine 2.5 mg daily for improved blood pressure control if blood pressure remains elevated over the next few days despite taking losartan 25 mg twice a day 4) physical therapy, occupational therapy and speech therapy advised 5) fall precautions 6) follow-up with neurologist as outpatient in 1-2 weeks

## 2017-11-03 NOTE — Progress Notes (Signed)
Attempted to call report to [facility]  with no response.

## 2017-11-03 NOTE — Progress Notes (Signed)
Attempted to call report to [facility] place with no answer. Voicemail left for nurse.

## 2017-11-03 NOTE — Discharge Summary (Signed)
[name], is a 84 y.o. female  DOB [birth year]  MRN [id number].  Admission date:  10/30/2017  Admitting Physician  Elwyn Reach, MD  Discharge Date:  11/03/2017   Primary MD  Plotnikov, Evie Lacks, MD  Recommendations for primary care physician for things to follow:   1) take Xarelto 15 mg with supper daily in place of Eliquis for secondary stroke prevention due to history of atrial fibrillation with multiple acute strokes 2) increase pravastatin to 40 mg every evening to reduce risk of further strokes 3) consider adding amlodipine 2.5 mg daily for improved blood pressure control if blood pressure remains elevated over the next few days despite taking losartan 25 mg twice a day 4) physical therapy, occupational therapy and speech therapy advised 5) fall precautions 6) follow-up with neurologist as outpatient in 1-2 weeks   Admission Diagnosis  Aphasia [R47.01]   Discharge Diagnosis  Aphasia [R47.01]    Principal Problem:   Confusion Active Problems:   Atrial fibrillation ([place])   GERD      Past Medical History:  Diagnosis Date  . Anxiety   . Aortic stenosis    s/p TAVR  . Arthritis   . Atrial fibrillation ([place]) 2011  . Atrioventricular block, complete ([place])    Pacemaker insertion 11/17/16, Medtronic  . COPD (chronic obstructive pulmonary disease) ([place])   . Diverticulosis of colon   . GERD (gastroesophageal reflux disease)    Dr. Earlean Shawl  . Hx of adenomatous colonic polyps   . Hyperlipemia   . Hypertension   . Mesenteric mass    2 cm  . Peripheral vascular disease, unspecified ([place])   . PVD (peripheral vascular disease) ([facility])    Dr. Elisabeth Cara  . SBO (small bowel obstruction) ([facility])     due to adhesions  . Stricture and stenosis of esophagus   . Thyroid disease   . Unspecified peritonitis   . Unspecified vitamin D deficiency     Past Surgical History:  Procedure Laterality  Date  . ABDOMINAL ADHESION SURGERY    . APPENDECTOMY  1997   ruptured  . CARDIAC VALVE REPLACEMENT     TAVR  . CATARACT EXTRACTION, BILATERAL Bilateral 2016  . CHOLECYSTECTOMY    . COLOSTOMY CLOSURE  1997   with end-to-end coloproctostomy anstomosis. ruptured  . CORONARY ANGIOPLASTY WITH STENT PLACEMENT  2018   3 stents  . HIP SURGERY    . OMENTECTOMY     Partial with gastrostomy  . PACEMAKER IMPLANT  11/17/2016   implanted at [facility],  Medtronic [id number] Azure XT SR MRI, IF:[id number], Lead: RV Medtronic [id number]. DX Atrioventricular Block/ I44.2  . SIGMOIDECTOMY     Segemental with colostomy and Hartmann       HPI  from the history and physical done on the day of admission:    Chief Complaint: Confusion  HPI: [name] is a 84 y.o. female with medical history significant of chronic atrial fibrillation with pacemaker in place, GERD, COPD, aortic stenosis status post aortic valve  replacement, peripheral vascular disease and previous small bowel obstruction with surgeries who presented with confusion in the last 1 week. Patient was able to do her ADLs and moving around per family and to the last week. She started having confusion and weakness. She was seen by her primary care physician and treated for UTI. Patient's speech has been off since Monday and she is becoming gradually a phasic. In the ER patient appears confused and sleepy. She is arousable but she has reverted to speaking [place] which is her primary language prior to [place]. There is no focal findings. ER physician has consulted neurology who did not think she is having a CVA but there is worried that she may be having a stroke.  ED Course:patient was evaluated in the ER with head CT negative for acute CVA. Urinalysis also negative for UTI. CBC and CMP was unremarkable. Neurology was consulted but was not convinced that patient was having an acute CVA. Patient's symptoms have been going on for days pain head CT has  been negative.She has a pacemaker in place but is MRI compatible    Hospital Course:   Brief Summary: 84 y.o. female with medical history significant of chronic atrial fibrillation with pacemaker in place on Eliquis PTA , GERD, COPD, aortic stenosis status post aortic valve replacement (TAVR 10/20/16) as well as  peripheral vascular disease admitted on 10/30/2017 with confusion, behavioral disturbance and lethargy and later found to have acute strokes in multiple vascular distributions suspicious for embolic stroke in the setting of atrial fibrillation  IMPRESSION- MRI/MRA Head 1. Small acute infarcts in the right cerebellum, left frontal white matter, and corpus callosum. 2. Moderately extensive chronic small vessel ischemic disease. 3. Chronic left cerebellar infarcts. 4. Severely motion degraded head MRA.  Patent large vessels   Plan:- 1)Acute CVAs----in the setting of underlying atrial fibrillation and the patient was on Eliquis prior to admission , patient admitted with aphasia and confusional episodes,  MRI/MRA head with Small acute infarcts in the right cerebellum, left frontal white matter, and corpus callosum as well as chronic left cerebellar infarcts . workup for infection appears negative at this time. D/w On-call Neurologist Dr Rory Percy recommended transfer to [facility] for further cardiology and neurology evaluation and possible TEE as well as possible MRA of the neck vessels.  Family at this time declining further aggressive cardiology/or neurology workup, family specifically declines transfer to [facility] for further cardiology and neurology evaluation.  Patient was apparently compliant with Eliquis prior to admission, this would be considered an Eliquis failure, will increase pravastatin to 40 mg daily, will switch from Eliquis to Xarelto adjusted for patient's creatinine clearance.  Echocardiogram with EF of 50-53%, diastolic dysfunction, no evidence of intracardiac  thrombus on transthoracic echo.  Again family declined TEE at this time.   2)Chronic Atrial Fibrillation-stable, Patient was apparently compliant with Eliquis prior to admission, this would be considered an Eliquis failure, will increase pravastatin to 40 mg daily, will switch from Eliquis to Xarelto adjusted for patient's creatinine clearance.  , rate appears controlled at this time patient actually appears to be in sinus rhythm at this time.  Transthoracic echo without intracardiac thrombus, please see #1 above  3)HTN-intermittent elevation of BP noted, c/n  losartan at 25 mg twice daily, we allowed some permissive hypertension given acute stroke, okay to add amlodipine 2.5 mg daily if blood pressure remains elevated over the next couple days  4)Dementia/Depression-prior to admission patient was on Aricept,  continue Aricept  and Lexapro.  According to patient's daughter she has had ECT (ElectroConvulsive Treatment) for severe depression in the past  5)Social/Ethics-plan of care discussed with patient and daughters , at this time patient is a full code,  6)Disposition-prior to admission patient was living in independent living apartment, s PT evaluation appreciated, transferred to skilled nursing facility placement and 24/7 supervision,   Code Status : Full  Disposition Plan  : SNF  Consults  :  PT/OT  DVT Prophylaxis  :  Xarelto   Discharge Condition: stable  Follow UP  Contact information for after-discharge care    Destination    [facility] .   Service:  Skilled Nursing Contact information: [place] [phone]               Consults obtained - d/w Dr Rory Percy by phone  Diet and Activity recommendation:  As advised  Discharge Instructions     Discharge Instructions    Call MD for:  difficulty breathing, headache or visual disturbances   Complete by:  As directed    Call MD for:  persistant dizziness or  light-headedness   Complete by:  As directed    Call MD for:  persistant nausea and vomiting   Complete by:  As directed    Call MD for:  severe uncontrolled pain   Complete by:  As directed    Call MD for:  temperature >100.4   Complete by:  As directed    Diet - low sodium heart healthy   Complete by:  As directed    Discharge instructions   Complete by:  As directed    1) take Xarelto 15 mg with supper daily in place of Eliquis for secondary stroke prevention due to history of atrial fibrillation with multiple acute strokes 2) increase pravastatin to 40 mg every evening to reduce risk of further strokes 3) consider adding amlodipine 2.5 mg daily for improved blood pressure control if blood pressure remains elevated over the next few days despite taking losartan 25 mg twice a day 4) physical therapy, occupational therapy and speech therapy advised 5) fall precautions 6) follow-up with neurologist as outpatient in 1-2 weeks   Increase activity slowly   Complete by:  As directed         Discharge Medications     Allergies as of 11/03/2017      Reactions   Benicar [olmesartan Medoxomil]    hyperkalemia   Ezetimibe-simvastatin    REACTION: ACHES   Hydrochlorothiazide    REACTION: ? rash   Lisinopril    REACTION: cough   Promethazine Hcl    REACTION: palpitations      Medication List    STOP taking these medications   cefUROXime 250 MG tablet Commonly known as:  CEFTIN   ELIQUIS 2.5 MG Tabs tablet Generic drug:  apixaban     TAKE these medications   acetaminophen 500 MG tablet Commonly known as:  TYLENOL Take 500 mg by mouth every 6 (six) hours as needed. For pain   aspirin 81 MG EC tablet Take 1 tablet (81 mg total) by mouth daily. With food for 1 month only Start taking on:  11/04/2017   donepezil 5 MG tablet Commonly known as:  ARICEPT Take 1 tablet (5 mg total) by mouth at bedtime.   escitalopram 5 MG tablet Commonly known as:  LEXAPRO Take 1 tablet (5  mg total) by mouth daily. What changed:  how much to take  lactobacillus Pack Take 1 packet (1 g total) by mouth 3 (three) times daily with meals.   losartan 25 MG tablet Commonly known as:  COZAAR Take 1 tablet (25 mg total) by mouth 2 (two) times daily.   nitroGLYCERIN 0.4 MG SL tablet Commonly known as:  NITROSTAT Place 0.4 mg under the tongue every 5 (five) minutes as needed. Total of 3 doses in 15 minutes.   omeprazole 20 MG capsule Commonly known as:  PRILOSEC Take 20 mg by mouth daily.   ondansetron 4 MG disintegrating tablet Commonly known as:  ZOFRAN-ODT Take 4 mg by mouth every 8 (eight) hours as needed for nausea or vomiting.   ondansetron 4 MG tablet Commonly known as:  ZOFRAN Take 1 tablet (4 mg total) by mouth every 6 (six) hours as needed for nausea.   pravastatin 40 MG tablet Commonly known as:  PRAVACHOL Take 1 tablet (40 mg total) by mouth every evening. What changed:    medication strength  how much to take   Rivaroxaban 15 MG Tabs tablet Commonly known as:  XARELTO Take 1 tablet (15 mg total) by mouth daily with supper.   Vitamin D3 2000 units capsule Take 2,000 Units by mouth daily.       Major procedures and Radiology Reports - PLEASE review detailed and final reports for all details, in brief -     Ct Head Wo Contrast  Result Date: 10/30/2017 CLINICAL DATA:  Expressive aphasia EXAM: CT HEAD WITHOUT CONTRAST TECHNIQUE: Contiguous axial images were obtained from the base of the skull through the vertex without intravenous contrast. COMPARISON:  October 05, 2017 FINDINGS: Brain: There is moderate diffuse atrophy. There is no intracranial mass, hemorrhage, extra-axial fluid collection, or midline shift. There is extensive small vessel disease throughout the centra semiovale bilaterally, stable. There is small vessel disease in each thalamus, internal capsule common external capsule. No acute infarct evident. Vascular: No hyperdense vessels. There  is calcification in each carotid siphon. Skull: Bony calvarium appears intact. Sinuses/Orbits: Visualized paranasal sinuses are clear. Visualized orbits appear symmetric bilaterally. Other: Mastoid air cells are clear. IMPRESSION: Atrophy with extensive supratentorial small vessel disease. No acute infarct. No mass or hemorrhage. There are foci of arterial vascular calcification. Electronically Signed   By: Lowella Grip III M.D.   On: 10/30/2017 16:22   Ct Head Wo Contrast  Result Date: 10/05/2017 CLINICAL DATA:  84 year old female with several days of confusion. No recent falls. Stroke last year. Initial encounter. EXAM: CT HEAD WITHOUT CONTRAST TECHNIQUE: Contiguous axial images were obtained from the base of the skull through the vertex without intravenous contrast. COMPARISON:  04/16/2009 CT. FINDINGS: Brain: No intracranial hemorrhage or CT evidence of large acute infarct. Remote right corona radiata infarct. Prominent chronic microvascular changes. Global atrophy. No intracranial mass lesion noted on this unenhanced exam. Vascular: Vascular calcifications Skull: Negative Sinuses/Orbits: Visualized orbits unremarkable. Visualized paranasal sinuses clear. Other: Visualized mastoid air cells and middle ear cavities clear. IMPRESSION: No acute intracranial abnormality. Remote right corona radiata infarct and prominent chronic microvascular changes. Atrophy. Electronically Signed   By: Genia Del M.D.   On: 10/05/2017 17:56   Mr Jodene Nam Head Wo Contrast  Result Date: 11/02/2017 CLINICAL DATA:  Confusion.  Atrial fibrillation. EXAM: MRI HEAD WITHOUT CONTRAST MRA HEAD WITHOUT CONTRAST TECHNIQUE: Multiplanar, multiecho pulse sequences of the brain and surrounding structures were obtained without intravenous contrast. Angiographic images of the head were obtained using MRA technique without contrast. COMPARISON:  Head CT 10/30/2017 FINDINGS: MRI  HEAD FINDINGS The study is motion degraded throughout, with  some sequences being moderately to severely degraded. Brain: There is a small acute infarct in the right cerebellum. Punctate foci of mild trace diffusion hyperintensity in the left frontal white matter and right body of the corpus callosum also likely represent acute infarcts. A chronic microhemorrhage is noted in the right parietal lobe. There is moderate cerebral atrophy. Patchy to confluent T2 hyperintensities throughout the cerebral white matter and pons are nonspecific but compatible with moderately extensive chronic small vessel ischemic disease. There are small chronic infarcts in the left cerebellum. Vascular: Major intracranial vascular flow voids are preserved. Skull and upper cervical spine: No suspicious marrow lesion. Sinuses/Orbits: Bilateral cataract extraction. Paranasal sinuses and mastoid air cells are clear. Other: None. MRA HEAD FINDINGS The study is severely motion degraded and only marginally diagnostic. There is gross flow in the distal intracranial vertebral arteries with the left appearing mildly dominant. The basilar artery is patent without evidence of significant stenosis allowing for focal motion artifact through its midportion. The PCAs are grossly patent with motion artifact precluding assessment for stenosis. The internal carotid arteries are patent from skull base to carotid termini without convincing high-grade stenosis allowing for motion artifact. The ACAs and MCAs are grossly patent with signal loss in both distal M1 segments favored to be artifactual given the presence of flow in more distal branch vessels. IMPRESSION: 1. Small acute infarcts in the right cerebellum, left frontal white matter, and corpus callosum. 2. Moderately extensive chronic small vessel ischemic disease. 3. Chronic left cerebellar infarcts. 4. Severely motion degraded head MRA.  Patent large vessels. Electronically Signed   By: Logan Bores M.D.   On: 11/02/2017 19:32   Mr Brain Wo Contrast  Result  Date: 11/02/2017 CLINICAL DATA:  Confusion.  Atrial fibrillation. EXAM: MRI HEAD WITHOUT CONTRAST MRA HEAD WITHOUT CONTRAST TECHNIQUE: Multiplanar, multiecho pulse sequences of the brain and surrounding structures were obtained without intravenous contrast. Angiographic images of the head were obtained using MRA technique without contrast. COMPARISON:  Head CT 10/30/2017 FINDINGS: MRI HEAD FINDINGS The study is motion degraded throughout, with some sequences being moderately to severely degraded. Brain: There is a small acute infarct in the right cerebellum. Punctate foci of mild trace diffusion hyperintensity in the left frontal white matter and right body of the corpus callosum also likely represent acute infarcts. A chronic microhemorrhage is noted in the right parietal lobe. There is moderate cerebral atrophy. Patchy to confluent T2 hyperintensities throughout the cerebral white matter and pons are nonspecific but compatible with moderately extensive chronic small vessel ischemic disease. There are small chronic infarcts in the left cerebellum. Vascular: Major intracranial vascular flow voids are preserved. Skull and upper cervical spine: No suspicious marrow lesion. Sinuses/Orbits: Bilateral cataract extraction. Paranasal sinuses and mastoid air cells are clear. Other: None. MRA HEAD FINDINGS The study is severely motion degraded and only marginally diagnostic. There is gross flow in the distal intracranial vertebral arteries with the left appearing mildly dominant. The basilar artery is patent without evidence of significant stenosis allowing for focal motion artifact through its midportion. The PCAs are grossly patent with motion artifact precluding assessment for stenosis. The internal carotid arteries are patent from skull base to carotid termini without convincing high-grade stenosis allowing for motion artifact. The ACAs and MCAs are grossly patent with signal loss in both distal M1 segments favored to be  artifactual given the presence of flow in more distal branch vessels. IMPRESSION: 1. Small acute infarcts  in the right cerebellum, left frontal white matter, and corpus callosum. 2. Moderately extensive chronic small vessel ischemic disease. 3. Chronic left cerebellar infarcts. 4. Severely motion degraded head MRA.  Patent large vessels. Electronically Signed   By: Logan Bores M.D.   On: 11/02/2017 19:32    Micro Results    Recent Results (from the past 240 hour(s))  Urine Culture     Status: None   Collection Time: 10/29/17  3:45 PM  Result Value Ref Range Status   MICRO NUMBER: [id number]  Final   SPECIMEN QUALITY: ADEQUATE  Final   Sample Source URINE  Final   STATUS: FINAL  Final   Result:   Final    Multiple organisms present, each less than 10,000 CFU/mL. These organisms, commonly found on external and internal genitalia, are considered to be colonizers. No further testing performed.  Urine Culture     Status: None   Collection Time: 10/30/17  3:40 PM  Result Value Ref Range Status   Specimen Description   Final    URINE, CLEAN CATCH Performed at [facility], [place] [address] [place], [place] [address]    Special Requests   Final    NONE Performed at [facility], [place] [address], [place], [place] [address]    Culture   Final    NO GROWTH Performed at [facility], [place] [address], [place], [place] [address]    Report Status 11/01/2017 FINAL  Final       Today   Subjective    [name] today has no new complaints, was more awake and talking in the morning patient becoming more sleepy now she sleeps on and off, family at bedside,          Patient has been seen and examined prior to discharge   Objective   Blood pressure (!) 158/86, pulse 62, temperature (!) 97.5 F (36.4 C), temperature source Oral, resp. rate 18, SpO2 100 %.   Intake/Output Summary (Last 24 hours) at 11/03/2017 1513 Last data filed at 11/03/2017  1000 Gross per 24 hour  Intake 120 ml  Output 0 ml  Net 120 ml    Exam Gen:-Patient sleeps on and off HEENT:- [place].AT, No sclera icterus Neck-Supple Neck,No JVD,.  Lungs-  CTAB , good air movement CV- S1, S2 normal, history of A. fib with actually regular at this time Abd-  +ve B.Sounds, Abd Soft, No tenderness,    Extremity/Skin:- No  edema,   good pulses Psych-somewhat sleepy, significant cognitive deficits consistent with underlying dementia Neuro-speech appears to have improved, cognitive deficits noted, generalized weakness, appears to move all extremities well,   Data Review   CBC w Diff:  Lab Results  Component Value Date   WBC 7.0 10/31/2017   HGB 12.3 10/31/2017   HCT 38.4 10/31/2017   PLT 123 (L) 10/31/2017   LYMPHOPCT 23 10/30/2017   MONOPCT 10 10/30/2017   EOSPCT 1 10/30/2017   BASOPCT 1 10/30/2017    CMP:  Lab Results  Component Value Date   NA 137 10/31/2017   K 4.0 10/31/2017   CL 104 10/31/2017   CO2 24 10/31/2017   BUN 15 10/31/2017   CREATININE 0.89 10/31/2017   PROT 6.0 (L) 10/31/2017   ALBUMIN 3.2 (L) 10/31/2017   BILITOT 0.8 10/31/2017   ALKPHOS 82 10/31/2017   AST 16 10/31/2017   ALT 11 (L) 10/31/2017   Total Discharge time is about 33 minutes  [name]  M.D on 11/03/2017 at 3:13 PM  [facility]   Office  [phone]  Voice Recognition Viviann Spare dictation system was used to create this note, attempts have been made to correct errors. Please contact the author with questions and/or clarifications.

## 2017-11-03 NOTE — Progress Notes (Signed)
CSW contacted and arranged PTAR,patient's family notified [facility]). Packet complete. CSW signing off, no other needs identified at this time.   Abundio Miu, [place] Social Worker [facility] Cell#: [phone]

## 2017-11-03 NOTE — Progress Notes (Signed)
Occupational Therapy Treatment Patient Details Name: [name] MRN: [id number] DOB: 1918/10/14 Today's Date: 11/03/2017    History of present illness  84 y.o. female with medical history significant of chronic atrial fibrillation with pacemaker in place, GERD, COPD, aortic stenosis status post aortic valve replacement, peripheral vascular disease admitted on 10/30/2017 with confusion, behavioral disturbance and lethargy   OT comments  Pt very difficult to arouse. Pts eyes did open with repositioning   Follow Up Recommendations  SNF    Equipment Recommendations  None recommended by OT    Recommendations for Other Services      Precautions / Restrictions Precautions Precautions: Fall Precaution Comments: incontinent Restrictions Weight Bearing Restrictions: No       Mobility Bed Mobility Overal bed mobility: Needs Assistance Bed Mobility: Rolling Rolling: +2 for physical assistance;+2 for safety/equipment;Max assist     Sit to supine: Mod assist   General bed mobility comments: pt seemed to stiffen up with attempted movement        General transfer comment: multimodal cues for technique, assist for rise and steady, cues at hips for trunk flexion to initiate sitting, manual assist for hand placement        ADL either performed or assessed with clinical judgement   ADL       Grooming: Bed level;Total assistance                                 General ADL Comments: pt very hard to arouse. Pt stating easter over and over.  Pt did not follow any commands               Cognition Arousal/Alertness: Lethargic Behavior During Therapy: Agitated Overall Cognitive Status: Impaired/Different from baseline                                 General Comments: Somewhat difficult to arouse patient at first. Became more alert with activity (upon standing), required tactile cuing, did not respond to verbal cuing.                    Pertinent Vitals/ Pain       Pain Assessment: Faces Faces Pain Scale: No hurt  Home Living Family/patient expects to be discharged to:: Skilled nursing facility                                 Additional Comments: unknown PLOF as pt nonverbal at this time      Prior Functioning/Environment          Comments: rollator no longer in room   Frequency  Min 2X/week        Progress Toward Goals  OT Goals(current goals can now be found in the care plan section)        Plan Discharge plan remains appropriate    Co-evaluation                 AM-PAC PT "6 Clicks" Daily Activity     Outcome Measure   Help from another person eating meals?: Total Help from another person taking care of personal grooming?: Total Help from another person toileting, which includes using toliet, bedpan, or urinal?: Total Help from another person bathing (including washing, rinsing, drying)?: Total Help from another person to put on and  taking off regular upper body clothing?: Total   6 Click Score: 5    End of Session    OT Visit Diagnosis: Other abnormalities of gait and mobility (R26.89);Muscle weakness (generalized) (M62.81);Cognitive communication deficit (R41.841)   Activity Tolerance Treatment limited secondary to agitation(limited eval as pt not following commands this day)   Patient Left in bed;with call bell/phone within reach;with bed alarm set;with family/visitor present   Nurse Communication Mobility status        Time: 1430-1444 OT Time Calculation (min): 14 min  Charges: OT General Charges $OT Visit: 1 Visit OT Treatments $Self Care/Home Management : 8-22 mins  [place], [place]   Betsy Pries 11/03/2017, 2:53 PM

## 2017-11-04 ENCOUNTER — Telehealth: Payer: Self-pay

## 2017-11-04 DIAGNOSIS — Z8673 Personal history of transient ischemic attack (TIA), and cerebral infarction without residual deficits: Secondary | ICD-10-CM | POA: Diagnosis not present

## 2017-11-04 DIAGNOSIS — I69320 Aphasia following cerebral infarction: Secondary | ICD-10-CM | POA: Diagnosis not present

## 2017-11-04 DIAGNOSIS — I4891 Unspecified atrial fibrillation: Secondary | ICD-10-CM | POA: Diagnosis not present

## 2017-11-04 DIAGNOSIS — F0390 Unspecified dementia without behavioral disturbance: Secondary | ICD-10-CM | POA: Diagnosis not present

## 2017-11-04 DIAGNOSIS — R2689 Other abnormalities of gait and mobility: Secondary | ICD-10-CM | POA: Diagnosis not present

## 2017-11-04 DIAGNOSIS — I10 Essential (primary) hypertension: Secondary | ICD-10-CM | POA: Diagnosis not present

## 2017-11-04 NOTE — Telephone Encounter (Signed)
Pt dc'ed to SNF [facility]) on 11/04/2017 after admission for Aphasia.

## 2017-11-11 DIAGNOSIS — F329 Major depressive disorder, single episode, unspecified: Secondary | ICD-10-CM | POA: Diagnosis not present

## 2017-11-15 DIAGNOSIS — I10 Essential (primary) hypertension: Secondary | ICD-10-CM | POA: Diagnosis not present

## 2017-11-15 DIAGNOSIS — Z8673 Personal history of transient ischemic attack (TIA), and cerebral infarction without residual deficits: Secondary | ICD-10-CM | POA: Diagnosis not present

## 2017-11-15 DIAGNOSIS — I4891 Unspecified atrial fibrillation: Secondary | ICD-10-CM | POA: Diagnosis not present

## 2017-11-15 DIAGNOSIS — F0390 Unspecified dementia without behavioral disturbance: Secondary | ICD-10-CM | POA: Diagnosis not present

## 2017-11-16 DIAGNOSIS — F0390 Unspecified dementia without behavioral disturbance: Secondary | ICD-10-CM | POA: Diagnosis not present

## 2017-11-16 DIAGNOSIS — I4891 Unspecified atrial fibrillation: Secondary | ICD-10-CM | POA: Diagnosis not present

## 2017-11-16 DIAGNOSIS — K219 Gastro-esophageal reflux disease without esophagitis: Secondary | ICD-10-CM | POA: Diagnosis not present

## 2017-11-16 DIAGNOSIS — R1310 Dysphagia, unspecified: Secondary | ICD-10-CM | POA: Diagnosis not present

## 2017-11-16 DIAGNOSIS — I10 Essential (primary) hypertension: Secondary | ICD-10-CM | POA: Diagnosis not present

## 2017-11-16 DIAGNOSIS — I482 Chronic atrial fibrillation: Secondary | ICD-10-CM | POA: Diagnosis not present

## 2017-11-16 DIAGNOSIS — Z79899 Other long term (current) drug therapy: Secondary | ICD-10-CM | POA: Diagnosis not present

## 2017-11-16 DIAGNOSIS — R41841 Cognitive communication deficit: Secondary | ICD-10-CM | POA: Diagnosis not present

## 2017-11-16 DIAGNOSIS — R4701 Aphasia: Secondary | ICD-10-CM | POA: Diagnosis not present

## 2017-11-16 DIAGNOSIS — I679 Cerebrovascular disease, unspecified: Secondary | ICD-10-CM | POA: Diagnosis not present

## 2017-11-16 DIAGNOSIS — I63342 Cerebral infarction due to thrombosis of left cerebellar artery: Secondary | ICD-10-CM | POA: Diagnosis not present

## 2017-11-16 DIAGNOSIS — I69320 Aphasia following cerebral infarction: Secondary | ICD-10-CM | POA: Diagnosis not present

## 2017-11-16 DIAGNOSIS — N390 Urinary tract infection, site not specified: Secondary | ICD-10-CM | POA: Diagnosis not present

## 2017-11-16 DIAGNOSIS — R1311 Dysphagia, oral phase: Secondary | ICD-10-CM | POA: Diagnosis not present

## 2017-11-16 DIAGNOSIS — R197 Diarrhea, unspecified: Secondary | ICD-10-CM | POA: Diagnosis not present

## 2017-11-16 DIAGNOSIS — E039 Hypothyroidism, unspecified: Secondary | ICD-10-CM | POA: Diagnosis not present

## 2017-11-16 DIAGNOSIS — M6281 Muscle weakness (generalized): Secondary | ICD-10-CM | POA: Diagnosis not present

## 2017-11-16 DIAGNOSIS — E785 Hyperlipidemia, unspecified: Secondary | ICD-10-CM | POA: Diagnosis not present

## 2017-11-17 ENCOUNTER — Ambulatory Visit: Payer: Self-pay | Admitting: Neurology

## 2017-11-17 ENCOUNTER — Encounter: Payer: Self-pay | Admitting: Neurology

## 2017-11-17 DIAGNOSIS — I679 Cerebrovascular disease, unspecified: Secondary | ICD-10-CM | POA: Diagnosis not present

## 2017-11-17 DIAGNOSIS — I482 Chronic atrial fibrillation: Secondary | ICD-10-CM | POA: Diagnosis not present

## 2017-11-17 DIAGNOSIS — I10 Essential (primary) hypertension: Secondary | ICD-10-CM | POA: Diagnosis not present

## 2017-11-18 ENCOUNTER — Telehealth: Payer: Self-pay

## 2017-11-18 NOTE — Telephone Encounter (Signed)
Patient no show for appt on 11/17/2017.

## 2017-11-19 DIAGNOSIS — I482 Chronic atrial fibrillation: Secondary | ICD-10-CM | POA: Diagnosis not present

## 2017-11-19 DIAGNOSIS — I69320 Aphasia following cerebral infarction: Secondary | ICD-10-CM | POA: Diagnosis not present

## 2017-11-19 DIAGNOSIS — I10 Essential (primary) hypertension: Secondary | ICD-10-CM | POA: Diagnosis not present

## 2017-11-19 DIAGNOSIS — I679 Cerebrovascular disease, unspecified: Secondary | ICD-10-CM | POA: Diagnosis not present

## 2017-11-22 ENCOUNTER — Ambulatory Visit: Payer: Medicare Other | Admitting: Endocrinology

## 2017-11-22 DIAGNOSIS — I69320 Aphasia following cerebral infarction: Secondary | ICD-10-CM | POA: Diagnosis not present

## 2017-11-22 DIAGNOSIS — N390 Urinary tract infection, site not specified: Secondary | ICD-10-CM | POA: Diagnosis not present

## 2017-11-25 DIAGNOSIS — I679 Cerebrovascular disease, unspecified: Secondary | ICD-10-CM | POA: Diagnosis not present

## 2017-11-25 DIAGNOSIS — N390 Urinary tract infection, site not specified: Secondary | ICD-10-CM | POA: Diagnosis not present

## 2017-11-25 DIAGNOSIS — I69320 Aphasia following cerebral infarction: Secondary | ICD-10-CM | POA: Diagnosis not present

## 2017-12-02 DIAGNOSIS — I69320 Aphasia following cerebral infarction: Secondary | ICD-10-CM | POA: Diagnosis not present

## 2017-12-02 DIAGNOSIS — I679 Cerebrovascular disease, unspecified: Secondary | ICD-10-CM | POA: Diagnosis not present

## 2017-12-02 DIAGNOSIS — I482 Chronic atrial fibrillation: Secondary | ICD-10-CM | POA: Diagnosis not present

## 2017-12-10 ENCOUNTER — Encounter

## 2017-12-10 ENCOUNTER — Ambulatory Visit: Payer: Medicare Other | Admitting: Cardiology

## 2017-12-20 DIAGNOSIS — I679 Cerebrovascular disease, unspecified: Secondary | ICD-10-CM | POA: Diagnosis not present

## 2017-12-20 DIAGNOSIS — E039 Hypothyroidism, unspecified: Secondary | ICD-10-CM | POA: Diagnosis not present

## 2017-12-20 DIAGNOSIS — R197 Diarrhea, unspecified: Secondary | ICD-10-CM | POA: Diagnosis not present

## 2017-12-23 DIAGNOSIS — R197 Diarrhea, unspecified: Secondary | ICD-10-CM | POA: Diagnosis not present

## 2018-01-03 ENCOUNTER — Telehealth: Payer: Self-pay | Admitting: Cardiology

## 2018-01-03 ENCOUNTER — Encounter: Payer: Medicare Other | Admitting: *Deleted

## 2018-01-03 NOTE — Telephone Encounter (Signed)
LMOVM reminding pt to send remote transmission.   

## 2018-01-05 ENCOUNTER — Encounter: Payer: Self-pay | Admitting: Cardiology

## 2018-01-12 DIAGNOSIS — I63449 Cerebral infarction due to embolism of unspecified cerebellar artery: Secondary | ICD-10-CM | POA: Diagnosis not present

## 2018-01-18 DIAGNOSIS — C44622 Squamous cell carcinoma of skin of right upper limb, including shoulder: Secondary | ICD-10-CM | POA: Diagnosis not present

## 2018-01-18 DIAGNOSIS — C44722 Squamous cell carcinoma of skin of right lower limb, including hip: Secondary | ICD-10-CM | POA: Diagnosis not present

## 2018-01-18 DIAGNOSIS — D485 Neoplasm of uncertain behavior of skin: Secondary | ICD-10-CM | POA: Diagnosis not present

## 2018-01-19 DIAGNOSIS — M2010 Hallux valgus (acquired), unspecified foot: Secondary | ICD-10-CM | POA: Diagnosis not present

## 2018-01-19 DIAGNOSIS — I739 Peripheral vascular disease, unspecified: Secondary | ICD-10-CM | POA: Diagnosis not present

## 2018-01-19 DIAGNOSIS — L603 Nail dystrophy: Secondary | ICD-10-CM | POA: Diagnosis not present

## 2018-01-19 DIAGNOSIS — E785 Hyperlipidemia, unspecified: Secondary | ICD-10-CM | POA: Diagnosis not present

## 2018-01-19 DIAGNOSIS — R296 Repeated falls: Secondary | ICD-10-CM | POA: Diagnosis not present

## 2018-01-19 DIAGNOSIS — B351 Tinea unguium: Secondary | ICD-10-CM | POA: Diagnosis not present

## 2018-01-19 DIAGNOSIS — F09 Unspecified mental disorder due to known physiological condition: Secondary | ICD-10-CM | POA: Diagnosis not present

## 2018-01-19 DIAGNOSIS — R945 Abnormal results of liver function studies: Secondary | ICD-10-CM | POA: Diagnosis not present

## 2018-01-19 DIAGNOSIS — I10 Essential (primary) hypertension: Secondary | ICD-10-CM | POA: Diagnosis not present

## 2018-01-19 DIAGNOSIS — R1310 Dysphagia, unspecified: Secondary | ICD-10-CM | POA: Diagnosis not present

## 2018-01-19 DIAGNOSIS — Q845 Enlarged and hypertrophic nails: Secondary | ICD-10-CM | POA: Diagnosis not present

## 2018-01-19 DIAGNOSIS — E559 Vitamin D deficiency, unspecified: Secondary | ICD-10-CM | POA: Diagnosis not present

## 2018-01-19 DIAGNOSIS — E119 Type 2 diabetes mellitus without complications: Secondary | ICD-10-CM | POA: Diagnosis not present

## 2018-01-19 DIAGNOSIS — F411 Generalized anxiety disorder: Secondary | ICD-10-CM | POA: Diagnosis not present

## 2018-01-19 DIAGNOSIS — F330 Major depressive disorder, recurrent, mild: Secondary | ICD-10-CM | POA: Diagnosis not present

## 2018-01-19 DIAGNOSIS — E039 Hypothyroidism, unspecified: Secondary | ICD-10-CM | POA: Diagnosis not present

## 2018-01-19 DIAGNOSIS — L853 Xerosis cutis: Secondary | ICD-10-CM | POA: Diagnosis not present

## 2018-01-20 DIAGNOSIS — I10 Essential (primary) hypertension: Secondary | ICD-10-CM | POA: Diagnosis not present

## 2018-01-20 DIAGNOSIS — Z7982 Long term (current) use of aspirin: Secondary | ICD-10-CM | POA: Diagnosis not present

## 2018-01-20 DIAGNOSIS — F0390 Unspecified dementia without behavioral disturbance: Secondary | ICD-10-CM | POA: Diagnosis not present

## 2018-01-20 DIAGNOSIS — I4891 Unspecified atrial fibrillation: Secondary | ICD-10-CM | POA: Diagnosis not present

## 2018-01-20 DIAGNOSIS — K219 Gastro-esophageal reflux disease without esophagitis: Secondary | ICD-10-CM | POA: Diagnosis not present

## 2018-01-20 DIAGNOSIS — Z8673 Personal history of transient ischemic attack (TIA), and cerebral infarction without residual deficits: Secondary | ICD-10-CM | POA: Diagnosis not present

## 2018-01-20 DIAGNOSIS — M6281 Muscle weakness (generalized): Secondary | ICD-10-CM | POA: Diagnosis not present

## 2018-01-20 DIAGNOSIS — Z7901 Long term (current) use of anticoagulants: Secondary | ICD-10-CM | POA: Diagnosis not present

## 2018-01-24 DIAGNOSIS — I10 Essential (primary) hypertension: Secondary | ICD-10-CM | POA: Diagnosis not present

## 2018-01-24 DIAGNOSIS — Z8673 Personal history of transient ischemic attack (TIA), and cerebral infarction without residual deficits: Secondary | ICD-10-CM | POA: Diagnosis not present

## 2018-01-24 DIAGNOSIS — K219 Gastro-esophageal reflux disease without esophagitis: Secondary | ICD-10-CM | POA: Diagnosis not present

## 2018-01-24 DIAGNOSIS — I4891 Unspecified atrial fibrillation: Secondary | ICD-10-CM | POA: Diagnosis not present

## 2018-01-24 DIAGNOSIS — F0390 Unspecified dementia without behavioral disturbance: Secondary | ICD-10-CM | POA: Diagnosis not present

## 2018-01-24 DIAGNOSIS — M6281 Muscle weakness (generalized): Secondary | ICD-10-CM | POA: Diagnosis not present

## 2018-01-26 DIAGNOSIS — M6281 Muscle weakness (generalized): Secondary | ICD-10-CM | POA: Diagnosis not present

## 2018-01-26 DIAGNOSIS — R296 Repeated falls: Secondary | ICD-10-CM | POA: Diagnosis not present

## 2018-01-26 DIAGNOSIS — I4891 Unspecified atrial fibrillation: Secondary | ICD-10-CM | POA: Diagnosis not present

## 2018-01-26 DIAGNOSIS — I10 Essential (primary) hypertension: Secondary | ICD-10-CM | POA: Diagnosis not present

## 2018-01-26 DIAGNOSIS — F0390 Unspecified dementia without behavioral disturbance: Secondary | ICD-10-CM | POA: Diagnosis not present

## 2018-01-26 DIAGNOSIS — Z8673 Personal history of transient ischemic attack (TIA), and cerebral infarction without residual deficits: Secondary | ICD-10-CM | POA: Diagnosis not present

## 2018-01-26 DIAGNOSIS — K219 Gastro-esophageal reflux disease without esophagitis: Secondary | ICD-10-CM | POA: Diagnosis not present

## 2018-02-02 DIAGNOSIS — M6281 Muscle weakness (generalized): Secondary | ICD-10-CM | POA: Diagnosis not present

## 2018-02-02 DIAGNOSIS — F0390 Unspecified dementia without behavioral disturbance: Secondary | ICD-10-CM | POA: Diagnosis not present

## 2018-02-02 DIAGNOSIS — I10 Essential (primary) hypertension: Secondary | ICD-10-CM | POA: Diagnosis not present

## 2018-02-02 DIAGNOSIS — K219 Gastro-esophageal reflux disease without esophagitis: Secondary | ICD-10-CM | POA: Diagnosis not present

## 2018-02-02 DIAGNOSIS — I4891 Unspecified atrial fibrillation: Secondary | ICD-10-CM | POA: Diagnosis not present

## 2018-02-02 DIAGNOSIS — H1033 Unspecified acute conjunctivitis, bilateral: Secondary | ICD-10-CM | POA: Diagnosis not present

## 2018-02-02 DIAGNOSIS — Z8673 Personal history of transient ischemic attack (TIA), and cerebral infarction without residual deficits: Secondary | ICD-10-CM | POA: Diagnosis not present

## 2018-02-04 DIAGNOSIS — G3184 Mild cognitive impairment, so stated: Secondary | ICD-10-CM | POA: Diagnosis not present

## 2018-02-04 DIAGNOSIS — K219 Gastro-esophageal reflux disease without esophagitis: Secondary | ICD-10-CM | POA: Diagnosis not present

## 2018-02-04 DIAGNOSIS — F0390 Unspecified dementia without behavioral disturbance: Secondary | ICD-10-CM | POA: Diagnosis not present

## 2018-02-04 DIAGNOSIS — I4891 Unspecified atrial fibrillation: Secondary | ICD-10-CM | POA: Diagnosis not present

## 2018-02-04 DIAGNOSIS — I10 Essential (primary) hypertension: Secondary | ICD-10-CM | POA: Diagnosis not present

## 2018-02-04 DIAGNOSIS — F330 Major depressive disorder, recurrent, mild: Secondary | ICD-10-CM | POA: Diagnosis not present

## 2018-02-04 DIAGNOSIS — M6281 Muscle weakness (generalized): Secondary | ICD-10-CM | POA: Diagnosis not present

## 2018-02-04 DIAGNOSIS — Z8673 Personal history of transient ischemic attack (TIA), and cerebral infarction without residual deficits: Secondary | ICD-10-CM | POA: Diagnosis not present

## 2018-02-07 DIAGNOSIS — Z8673 Personal history of transient ischemic attack (TIA), and cerebral infarction without residual deficits: Secondary | ICD-10-CM | POA: Diagnosis not present

## 2018-02-07 DIAGNOSIS — K219 Gastro-esophageal reflux disease without esophagitis: Secondary | ICD-10-CM | POA: Diagnosis not present

## 2018-02-07 DIAGNOSIS — F0390 Unspecified dementia without behavioral disturbance: Secondary | ICD-10-CM | POA: Diagnosis not present

## 2018-02-07 DIAGNOSIS — E039 Hypothyroidism, unspecified: Secondary | ICD-10-CM | POA: Diagnosis not present

## 2018-02-07 DIAGNOSIS — I4891 Unspecified atrial fibrillation: Secondary | ICD-10-CM | POA: Diagnosis not present

## 2018-02-07 DIAGNOSIS — E559 Vitamin D deficiency, unspecified: Secondary | ICD-10-CM | POA: Diagnosis not present

## 2018-02-07 DIAGNOSIS — E785 Hyperlipidemia, unspecified: Secondary | ICD-10-CM | POA: Diagnosis not present

## 2018-02-07 DIAGNOSIS — E119 Type 2 diabetes mellitus without complications: Secondary | ICD-10-CM | POA: Diagnosis not present

## 2018-02-07 DIAGNOSIS — R945 Abnormal results of liver function studies: Secondary | ICD-10-CM | POA: Diagnosis not present

## 2018-02-07 DIAGNOSIS — M6281 Muscle weakness (generalized): Secondary | ICD-10-CM | POA: Diagnosis not present

## 2018-02-07 DIAGNOSIS — I10 Essential (primary) hypertension: Secondary | ICD-10-CM | POA: Diagnosis not present

## 2018-02-09 DIAGNOSIS — M6281 Muscle weakness (generalized): Secondary | ICD-10-CM | POA: Diagnosis not present

## 2018-02-09 DIAGNOSIS — K219 Gastro-esophageal reflux disease without esophagitis: Secondary | ICD-10-CM | POA: Diagnosis not present

## 2018-02-09 DIAGNOSIS — I4891 Unspecified atrial fibrillation: Secondary | ICD-10-CM | POA: Diagnosis not present

## 2018-02-09 DIAGNOSIS — Z8673 Personal history of transient ischemic attack (TIA), and cerebral infarction without residual deficits: Secondary | ICD-10-CM | POA: Diagnosis not present

## 2018-02-09 DIAGNOSIS — F0390 Unspecified dementia without behavioral disturbance: Secondary | ICD-10-CM | POA: Diagnosis not present

## 2018-02-09 DIAGNOSIS — I10 Essential (primary) hypertension: Secondary | ICD-10-CM | POA: Diagnosis not present

## 2018-02-10 DIAGNOSIS — C44622 Squamous cell carcinoma of skin of right upper limb, including shoulder: Secondary | ICD-10-CM | POA: Diagnosis not present

## 2018-02-11 ENCOUNTER — Encounter: Payer: Self-pay | Admitting: Cardiology

## 2018-02-11 DIAGNOSIS — I4891 Unspecified atrial fibrillation: Secondary | ICD-10-CM | POA: Diagnosis not present

## 2018-02-11 DIAGNOSIS — F09 Unspecified mental disorder due to known physiological condition: Secondary | ICD-10-CM | POA: Diagnosis not present

## 2018-02-14 DIAGNOSIS — M6281 Muscle weakness (generalized): Secondary | ICD-10-CM | POA: Diagnosis not present

## 2018-02-14 DIAGNOSIS — Z8673 Personal history of transient ischemic attack (TIA), and cerebral infarction without residual deficits: Secondary | ICD-10-CM | POA: Diagnosis not present

## 2018-02-14 DIAGNOSIS — F0390 Unspecified dementia without behavioral disturbance: Secondary | ICD-10-CM | POA: Diagnosis not present

## 2018-02-14 DIAGNOSIS — I4891 Unspecified atrial fibrillation: Secondary | ICD-10-CM | POA: Diagnosis not present

## 2018-02-14 DIAGNOSIS — K219 Gastro-esophageal reflux disease without esophagitis: Secondary | ICD-10-CM | POA: Diagnosis not present

## 2018-02-14 DIAGNOSIS — I10 Essential (primary) hypertension: Secondary | ICD-10-CM | POA: Diagnosis not present

## 2018-02-17 DIAGNOSIS — I350 Nonrheumatic aortic (valve) stenosis: Secondary | ICD-10-CM | POA: Diagnosis not present

## 2018-02-17 DIAGNOSIS — Z8673 Personal history of transient ischemic attack (TIA), and cerebral infarction without residual deficits: Secondary | ICD-10-CM | POA: Diagnosis not present

## 2018-02-17 DIAGNOSIS — I739 Peripheral vascular disease, unspecified: Secondary | ICD-10-CM | POA: Diagnosis not present

## 2018-02-17 DIAGNOSIS — I480 Paroxysmal atrial fibrillation: Secondary | ICD-10-CM | POA: Diagnosis not present

## 2018-02-17 DIAGNOSIS — Z950 Presence of cardiac pacemaker: Secondary | ICD-10-CM | POA: Diagnosis not present

## 2018-02-17 DIAGNOSIS — M6281 Muscle weakness (generalized): Secondary | ICD-10-CM | POA: Diagnosis not present

## 2018-02-17 DIAGNOSIS — E0590 Thyrotoxicosis, unspecified without thyrotoxic crisis or storm: Secondary | ICD-10-CM | POA: Diagnosis not present

## 2018-02-17 DIAGNOSIS — K219 Gastro-esophageal reflux disease without esophagitis: Secondary | ICD-10-CM | POA: Diagnosis not present

## 2018-02-17 DIAGNOSIS — I2510 Atherosclerotic heart disease of native coronary artery without angina pectoris: Secondary | ICD-10-CM | POA: Diagnosis not present

## 2018-02-17 DIAGNOSIS — E7800 Pure hypercholesterolemia, unspecified: Secondary | ICD-10-CM | POA: Diagnosis not present

## 2018-02-17 DIAGNOSIS — I5032 Chronic diastolic (congestive) heart failure: Secondary | ICD-10-CM | POA: Diagnosis not present

## 2018-02-17 DIAGNOSIS — Z952 Presence of prosthetic heart valve: Secondary | ICD-10-CM | POA: Diagnosis not present

## 2018-02-17 DIAGNOSIS — I4891 Unspecified atrial fibrillation: Secondary | ICD-10-CM | POA: Diagnosis not present

## 2018-02-17 DIAGNOSIS — F0390 Unspecified dementia without behavioral disturbance: Secondary | ICD-10-CM | POA: Diagnosis not present

## 2018-02-17 DIAGNOSIS — I10 Essential (primary) hypertension: Secondary | ICD-10-CM | POA: Diagnosis not present

## 2018-02-22 DIAGNOSIS — I480 Paroxysmal atrial fibrillation: Secondary | ICD-10-CM | POA: Diagnosis not present

## 2018-02-23 DIAGNOSIS — E559 Vitamin D deficiency, unspecified: Secondary | ICD-10-CM | POA: Diagnosis not present

## 2018-02-23 DIAGNOSIS — R296 Repeated falls: Secondary | ICD-10-CM | POA: Diagnosis not present

## 2018-02-23 DIAGNOSIS — E039 Hypothyroidism, unspecified: Secondary | ICD-10-CM | POA: Diagnosis not present

## 2018-02-23 DIAGNOSIS — D3131 Benign neoplasm of right choroid: Secondary | ICD-10-CM | POA: Diagnosis not present

## 2018-02-23 DIAGNOSIS — E785 Hyperlipidemia, unspecified: Secondary | ICD-10-CM | POA: Diagnosis not present

## 2018-02-23 DIAGNOSIS — K219 Gastro-esophageal reflux disease without esophagitis: Secondary | ICD-10-CM | POA: Diagnosis not present

## 2018-02-23 DIAGNOSIS — R1310 Dysphagia, unspecified: Secondary | ICD-10-CM | POA: Diagnosis not present

## 2018-02-23 DIAGNOSIS — I10 Essential (primary) hypertension: Secondary | ICD-10-CM | POA: Diagnosis not present

## 2018-02-23 DIAGNOSIS — R945 Abnormal results of liver function studies: Secondary | ICD-10-CM | POA: Diagnosis not present

## 2018-02-23 DIAGNOSIS — I4891 Unspecified atrial fibrillation: Secondary | ICD-10-CM | POA: Diagnosis not present

## 2018-02-23 DIAGNOSIS — F09 Unspecified mental disorder due to known physiological condition: Secondary | ICD-10-CM | POA: Diagnosis not present

## 2018-02-23 DIAGNOSIS — H43813 Vitreous degeneration, bilateral: Secondary | ICD-10-CM | POA: Diagnosis not present

## 2018-02-23 DIAGNOSIS — E119 Type 2 diabetes mellitus without complications: Secondary | ICD-10-CM | POA: Diagnosis not present

## 2018-02-23 DIAGNOSIS — Z9849 Cataract extraction status, unspecified eye: Secondary | ICD-10-CM | POA: Diagnosis not present

## 2018-02-23 DIAGNOSIS — Z961 Presence of intraocular lens: Secondary | ICD-10-CM | POA: Diagnosis not present

## 2018-03-02 ENCOUNTER — Telehealth: Payer: Self-pay | Admitting: Cardiology

## 2018-03-02 NOTE — Telephone Encounter (Signed)
Spoke w/ pt daughter and confirmed that patient wanted to be released to [facility].

## 2018-03-04 DIAGNOSIS — F330 Major depressive disorder, recurrent, mild: Secondary | ICD-10-CM | POA: Diagnosis not present

## 2018-03-04 DIAGNOSIS — G3184 Mild cognitive impairment, so stated: Secondary | ICD-10-CM | POA: Diagnosis not present

## 2018-03-17 DIAGNOSIS — I4891 Unspecified atrial fibrillation: Secondary | ICD-10-CM | POA: Diagnosis not present

## 2018-03-17 DIAGNOSIS — Z7901 Long term (current) use of anticoagulants: Secondary | ICD-10-CM | POA: Diagnosis not present

## 2018-03-17 DIAGNOSIS — F0390 Unspecified dementia without behavioral disturbance: Secondary | ICD-10-CM | POA: Diagnosis not present

## 2018-03-17 DIAGNOSIS — E559 Vitamin D deficiency, unspecified: Secondary | ICD-10-CM | POA: Diagnosis not present

## 2018-03-17 DIAGNOSIS — F09 Unspecified mental disorder due to known physiological condition: Secondary | ICD-10-CM | POA: Diagnosis not present

## 2018-03-17 DIAGNOSIS — Z8673 Personal history of transient ischemic attack (TIA), and cerebral infarction without residual deficits: Secondary | ICD-10-CM | POA: Diagnosis not present

## 2018-03-17 DIAGNOSIS — I639 Cerebral infarction, unspecified: Secondary | ICD-10-CM | POA: Diagnosis not present

## 2018-03-17 DIAGNOSIS — Z950 Presence of cardiac pacemaker: Secondary | ICD-10-CM | POA: Diagnosis not present

## 2018-03-17 DIAGNOSIS — L570 Actinic keratosis: Secondary | ICD-10-CM | POA: Diagnosis not present

## 2018-03-17 DIAGNOSIS — Z7982 Long term (current) use of aspirin: Secondary | ICD-10-CM | POA: Diagnosis not present

## 2018-03-17 DIAGNOSIS — R1310 Dysphagia, unspecified: Secondary | ICD-10-CM | POA: Diagnosis not present

## 2018-03-17 DIAGNOSIS — C44622 Squamous cell carcinoma of skin of right upper limb, including shoulder: Secondary | ICD-10-CM | POA: Diagnosis not present

## 2018-04-05 DIAGNOSIS — I2510 Atherosclerotic heart disease of native coronary artery without angina pectoris: Secondary | ICD-10-CM | POA: Diagnosis not present

## 2018-04-05 DIAGNOSIS — C44722 Squamous cell carcinoma of skin of right lower limb, including hip: Secondary | ICD-10-CM | POA: Diagnosis not present

## 2018-04-05 DIAGNOSIS — Z7982 Long term (current) use of aspirin: Secondary | ICD-10-CM | POA: Diagnosis not present

## 2018-04-05 DIAGNOSIS — L570 Actinic keratosis: Secondary | ICD-10-CM | POA: Diagnosis not present

## 2018-04-05 DIAGNOSIS — Z952 Presence of prosthetic heart valve: Secondary | ICD-10-CM | POA: Diagnosis not present

## 2018-04-05 DIAGNOSIS — Z7901 Long term (current) use of anticoagulants: Secondary | ICD-10-CM | POA: Diagnosis not present

## 2018-04-05 DIAGNOSIS — Z950 Presence of cardiac pacemaker: Secondary | ICD-10-CM | POA: Diagnosis not present

## 2018-04-05 DIAGNOSIS — Z955 Presence of coronary angioplasty implant and graft: Secondary | ICD-10-CM | POA: Diagnosis not present

## 2018-04-13 DIAGNOSIS — R1310 Dysphagia, unspecified: Secondary | ICD-10-CM | POA: Diagnosis not present

## 2018-04-13 DIAGNOSIS — K219 Gastro-esophageal reflux disease without esophagitis: Secondary | ICD-10-CM | POA: Diagnosis not present

## 2018-04-14 DIAGNOSIS — R58 Hemorrhage, not elsewhere classified: Secondary | ICD-10-CM | POA: Diagnosis not present

## 2018-04-14 DIAGNOSIS — Z7901 Long term (current) use of anticoagulants: Secondary | ICD-10-CM | POA: Diagnosis not present

## 2018-04-14 DIAGNOSIS — K219 Gastro-esophageal reflux disease without esophagitis: Secondary | ICD-10-CM | POA: Diagnosis not present

## 2018-04-14 DIAGNOSIS — Z79899 Other long term (current) drug therapy: Secondary | ICD-10-CM | POA: Diagnosis not present

## 2018-04-14 DIAGNOSIS — R51 Headache: Secondary | ICD-10-CM | POA: Diagnosis not present

## 2018-04-14 DIAGNOSIS — I4891 Unspecified atrial fibrillation: Secondary | ICD-10-CM | POA: Diagnosis not present

## 2018-04-14 DIAGNOSIS — Z950 Presence of cardiac pacemaker: Secondary | ICD-10-CM | POA: Diagnosis not present

## 2018-04-14 DIAGNOSIS — W19XXXA Unspecified fall, initial encounter: Secondary | ICD-10-CM | POA: Diagnosis not present

## 2018-04-14 DIAGNOSIS — E785 Hyperlipidemia, unspecified: Secondary | ICD-10-CM | POA: Diagnosis not present

## 2018-04-14 DIAGNOSIS — F419 Anxiety disorder, unspecified: Secondary | ICD-10-CM | POA: Diagnosis not present

## 2018-04-14 DIAGNOSIS — Z87891 Personal history of nicotine dependence: Secondary | ICD-10-CM | POA: Diagnosis not present

## 2018-04-14 DIAGNOSIS — I10 Essential (primary) hypertension: Secondary | ICD-10-CM | POA: Diagnosis not present

## 2018-04-14 DIAGNOSIS — S81811A Laceration without foreign body, right lower leg, initial encounter: Secondary | ICD-10-CM | POA: Diagnosis not present

## 2018-04-14 DIAGNOSIS — S81812A Laceration without foreign body, left lower leg, initial encounter: Secondary | ICD-10-CM | POA: Diagnosis not present

## 2018-04-15 DIAGNOSIS — R531 Weakness: Secondary | ICD-10-CM | POA: Diagnosis not present

## 2018-04-15 DIAGNOSIS — S81801A Unspecified open wound, right lower leg, initial encounter: Secondary | ICD-10-CM | POA: Diagnosis not present

## 2018-04-17 DIAGNOSIS — I4891 Unspecified atrial fibrillation: Secondary | ICD-10-CM | POA: Diagnosis not present

## 2018-04-17 DIAGNOSIS — Z7901 Long term (current) use of anticoagulants: Secondary | ICD-10-CM | POA: Diagnosis not present

## 2018-04-17 DIAGNOSIS — Z8673 Personal history of transient ischemic attack (TIA), and cerebral infarction without residual deficits: Secondary | ICD-10-CM | POA: Diagnosis not present

## 2018-04-17 DIAGNOSIS — S81801D Unspecified open wound, right lower leg, subsequent encounter: Secondary | ICD-10-CM | POA: Diagnosis not present

## 2018-04-17 DIAGNOSIS — S81811D Laceration without foreign body, right lower leg, subsequent encounter: Secondary | ICD-10-CM | POA: Diagnosis not present

## 2018-04-17 DIAGNOSIS — S81012D Laceration without foreign body, left knee, subsequent encounter: Secondary | ICD-10-CM | POA: Diagnosis not present

## 2018-04-17 DIAGNOSIS — I10 Essential (primary) hypertension: Secondary | ICD-10-CM | POA: Diagnosis not present

## 2018-04-17 DIAGNOSIS — Z9181 History of falling: Secondary | ICD-10-CM | POA: Diagnosis not present

## 2018-04-17 DIAGNOSIS — F0390 Unspecified dementia without behavioral disturbance: Secondary | ICD-10-CM | POA: Diagnosis not present

## 2018-04-17 DIAGNOSIS — Z7982 Long term (current) use of aspirin: Secondary | ICD-10-CM | POA: Diagnosis not present

## 2018-04-18 DIAGNOSIS — F0390 Unspecified dementia without behavioral disturbance: Secondary | ICD-10-CM | POA: Diagnosis not present

## 2018-04-18 DIAGNOSIS — S81012D Laceration without foreign body, left knee, subsequent encounter: Secondary | ICD-10-CM | POA: Diagnosis not present

## 2018-04-18 DIAGNOSIS — I10 Essential (primary) hypertension: Secondary | ICD-10-CM | POA: Diagnosis not present

## 2018-04-18 DIAGNOSIS — S81801D Unspecified open wound, right lower leg, subsequent encounter: Secondary | ICD-10-CM | POA: Diagnosis not present

## 2018-04-18 DIAGNOSIS — I4891 Unspecified atrial fibrillation: Secondary | ICD-10-CM | POA: Diagnosis not present

## 2018-04-18 DIAGNOSIS — S81811D Laceration without foreign body, right lower leg, subsequent encounter: Secondary | ICD-10-CM | POA: Diagnosis not present

## 2018-04-20 DIAGNOSIS — F09 Unspecified mental disorder due to known physiological condition: Secondary | ICD-10-CM | POA: Diagnosis not present

## 2018-04-20 DIAGNOSIS — S81811D Laceration without foreign body, right lower leg, subsequent encounter: Secondary | ICD-10-CM | POA: Diagnosis not present

## 2018-04-20 DIAGNOSIS — S81012D Laceration without foreign body, left knee, subsequent encounter: Secondary | ICD-10-CM | POA: Diagnosis not present

## 2018-04-20 DIAGNOSIS — R1310 Dysphagia, unspecified: Secondary | ICD-10-CM | POA: Diagnosis not present

## 2018-04-20 DIAGNOSIS — E785 Hyperlipidemia, unspecified: Secondary | ICD-10-CM | POA: Diagnosis not present

## 2018-04-20 DIAGNOSIS — E559 Vitamin D deficiency, unspecified: Secondary | ICD-10-CM | POA: Diagnosis not present

## 2018-04-20 DIAGNOSIS — I10 Essential (primary) hypertension: Secondary | ICD-10-CM | POA: Diagnosis not present

## 2018-04-20 DIAGNOSIS — E119 Type 2 diabetes mellitus without complications: Secondary | ICD-10-CM | POA: Diagnosis not present

## 2018-04-20 DIAGNOSIS — R296 Repeated falls: Secondary | ICD-10-CM | POA: Diagnosis not present

## 2018-04-20 DIAGNOSIS — K219 Gastro-esophageal reflux disease without esophagitis: Secondary | ICD-10-CM | POA: Diagnosis not present

## 2018-04-20 DIAGNOSIS — S81801D Unspecified open wound, right lower leg, subsequent encounter: Secondary | ICD-10-CM | POA: Diagnosis not present

## 2018-04-20 DIAGNOSIS — R945 Abnormal results of liver function studies: Secondary | ICD-10-CM | POA: Diagnosis not present

## 2018-04-20 DIAGNOSIS — F0390 Unspecified dementia without behavioral disturbance: Secondary | ICD-10-CM | POA: Diagnosis not present

## 2018-04-20 DIAGNOSIS — I4891 Unspecified atrial fibrillation: Secondary | ICD-10-CM | POA: Diagnosis not present

## 2018-04-20 DIAGNOSIS — E039 Hypothyroidism, unspecified: Secondary | ICD-10-CM | POA: Diagnosis not present

## 2018-04-21 DIAGNOSIS — S81811D Laceration without foreign body, right lower leg, subsequent encounter: Secondary | ICD-10-CM | POA: Diagnosis not present

## 2018-04-21 DIAGNOSIS — F0390 Unspecified dementia without behavioral disturbance: Secondary | ICD-10-CM | POA: Diagnosis not present

## 2018-04-21 DIAGNOSIS — I10 Essential (primary) hypertension: Secondary | ICD-10-CM | POA: Diagnosis not present

## 2018-04-21 DIAGNOSIS — I4891 Unspecified atrial fibrillation: Secondary | ICD-10-CM | POA: Diagnosis not present

## 2018-04-21 DIAGNOSIS — S81012D Laceration without foreign body, left knee, subsequent encounter: Secondary | ICD-10-CM | POA: Diagnosis not present

## 2018-04-21 DIAGNOSIS — S81801D Unspecified open wound, right lower leg, subsequent encounter: Secondary | ICD-10-CM | POA: Diagnosis not present

## 2018-04-22 DIAGNOSIS — S81012D Laceration without foreign body, left knee, subsequent encounter: Secondary | ICD-10-CM | POA: Diagnosis not present

## 2018-04-22 DIAGNOSIS — S81801D Unspecified open wound, right lower leg, subsequent encounter: Secondary | ICD-10-CM | POA: Diagnosis not present

## 2018-04-22 DIAGNOSIS — I10 Essential (primary) hypertension: Secondary | ICD-10-CM | POA: Diagnosis not present

## 2018-04-22 DIAGNOSIS — I4891 Unspecified atrial fibrillation: Secondary | ICD-10-CM | POA: Diagnosis not present

## 2018-04-22 DIAGNOSIS — F0390 Unspecified dementia without behavioral disturbance: Secondary | ICD-10-CM | POA: Diagnosis not present

## 2018-04-22 DIAGNOSIS — S81811D Laceration without foreign body, right lower leg, subsequent encounter: Secondary | ICD-10-CM | POA: Diagnosis not present

## 2018-04-25 DIAGNOSIS — F0390 Unspecified dementia without behavioral disturbance: Secondary | ICD-10-CM | POA: Diagnosis not present

## 2018-04-25 DIAGNOSIS — S81801D Unspecified open wound, right lower leg, subsequent encounter: Secondary | ICD-10-CM | POA: Diagnosis not present

## 2018-04-25 DIAGNOSIS — I10 Essential (primary) hypertension: Secondary | ICD-10-CM | POA: Diagnosis not present

## 2018-04-25 DIAGNOSIS — I4891 Unspecified atrial fibrillation: Secondary | ICD-10-CM | POA: Diagnosis not present

## 2018-04-25 DIAGNOSIS — S81012D Laceration without foreign body, left knee, subsequent encounter: Secondary | ICD-10-CM | POA: Diagnosis not present

## 2018-04-25 DIAGNOSIS — S81811D Laceration without foreign body, right lower leg, subsequent encounter: Secondary | ICD-10-CM | POA: Diagnosis not present

## 2018-04-26 DIAGNOSIS — I10 Essential (primary) hypertension: Secondary | ICD-10-CM | POA: Diagnosis not present

## 2018-04-26 DIAGNOSIS — S81811D Laceration without foreign body, right lower leg, subsequent encounter: Secondary | ICD-10-CM | POA: Diagnosis not present

## 2018-04-26 DIAGNOSIS — I4891 Unspecified atrial fibrillation: Secondary | ICD-10-CM | POA: Diagnosis not present

## 2018-04-26 DIAGNOSIS — F0390 Unspecified dementia without behavioral disturbance: Secondary | ICD-10-CM | POA: Diagnosis not present

## 2018-04-26 DIAGNOSIS — S81801D Unspecified open wound, right lower leg, subsequent encounter: Secondary | ICD-10-CM | POA: Diagnosis not present

## 2018-04-26 DIAGNOSIS — S81012D Laceration without foreign body, left knee, subsequent encounter: Secondary | ICD-10-CM | POA: Diagnosis not present

## 2018-04-27 DIAGNOSIS — S81012D Laceration without foreign body, left knee, subsequent encounter: Secondary | ICD-10-CM | POA: Diagnosis not present

## 2018-04-27 DIAGNOSIS — S81801D Unspecified open wound, right lower leg, subsequent encounter: Secondary | ICD-10-CM | POA: Diagnosis not present

## 2018-04-27 DIAGNOSIS — I10 Essential (primary) hypertension: Secondary | ICD-10-CM | POA: Diagnosis not present

## 2018-04-27 DIAGNOSIS — I4891 Unspecified atrial fibrillation: Secondary | ICD-10-CM | POA: Diagnosis not present

## 2018-04-27 DIAGNOSIS — S81811D Laceration without foreign body, right lower leg, subsequent encounter: Secondary | ICD-10-CM | POA: Diagnosis not present

## 2018-04-27 DIAGNOSIS — F0390 Unspecified dementia without behavioral disturbance: Secondary | ICD-10-CM | POA: Diagnosis not present

## 2018-04-28 DIAGNOSIS — I10 Essential (primary) hypertension: Secondary | ICD-10-CM | POA: Diagnosis not present

## 2018-04-28 DIAGNOSIS — I4891 Unspecified atrial fibrillation: Secondary | ICD-10-CM | POA: Diagnosis not present

## 2018-04-28 DIAGNOSIS — S81012D Laceration without foreign body, left knee, subsequent encounter: Secondary | ICD-10-CM | POA: Diagnosis not present

## 2018-04-28 DIAGNOSIS — F0390 Unspecified dementia without behavioral disturbance: Secondary | ICD-10-CM | POA: Diagnosis not present

## 2018-04-28 DIAGNOSIS — S81801D Unspecified open wound, right lower leg, subsequent encounter: Secondary | ICD-10-CM | POA: Diagnosis not present

## 2018-04-28 DIAGNOSIS — S81811D Laceration without foreign body, right lower leg, subsequent encounter: Secondary | ICD-10-CM | POA: Diagnosis not present

## 2018-05-03 DIAGNOSIS — S81012D Laceration without foreign body, left knee, subsequent encounter: Secondary | ICD-10-CM | POA: Diagnosis not present

## 2018-05-03 DIAGNOSIS — S81811D Laceration without foreign body, right lower leg, subsequent encounter: Secondary | ICD-10-CM | POA: Diagnosis not present

## 2018-05-03 DIAGNOSIS — S81801D Unspecified open wound, right lower leg, subsequent encounter: Secondary | ICD-10-CM | POA: Diagnosis not present

## 2018-05-03 DIAGNOSIS — F0390 Unspecified dementia without behavioral disturbance: Secondary | ICD-10-CM | POA: Diagnosis not present

## 2018-05-03 DIAGNOSIS — I10 Essential (primary) hypertension: Secondary | ICD-10-CM | POA: Diagnosis not present

## 2018-05-03 DIAGNOSIS — I4891 Unspecified atrial fibrillation: Secondary | ICD-10-CM | POA: Diagnosis not present

## 2018-05-04 DIAGNOSIS — H6120 Impacted cerumen, unspecified ear: Secondary | ICD-10-CM | POA: Diagnosis not present

## 2018-05-05 DIAGNOSIS — S81801D Unspecified open wound, right lower leg, subsequent encounter: Secondary | ICD-10-CM | POA: Diagnosis not present

## 2018-05-05 DIAGNOSIS — S81811D Laceration without foreign body, right lower leg, subsequent encounter: Secondary | ICD-10-CM | POA: Diagnosis not present

## 2018-05-05 DIAGNOSIS — I4891 Unspecified atrial fibrillation: Secondary | ICD-10-CM | POA: Diagnosis not present

## 2018-05-05 DIAGNOSIS — S81012D Laceration without foreign body, left knee, subsequent encounter: Secondary | ICD-10-CM | POA: Diagnosis not present

## 2018-05-05 DIAGNOSIS — I10 Essential (primary) hypertension: Secondary | ICD-10-CM | POA: Diagnosis not present

## 2018-05-05 DIAGNOSIS — F0390 Unspecified dementia without behavioral disturbance: Secondary | ICD-10-CM | POA: Diagnosis not present

## 2018-05-06 DIAGNOSIS — S81012D Laceration without foreign body, left knee, subsequent encounter: Secondary | ICD-10-CM | POA: Diagnosis not present

## 2018-05-06 DIAGNOSIS — F0390 Unspecified dementia without behavioral disturbance: Secondary | ICD-10-CM | POA: Diagnosis not present

## 2018-05-06 DIAGNOSIS — I4891 Unspecified atrial fibrillation: Secondary | ICD-10-CM | POA: Diagnosis not present

## 2018-05-06 DIAGNOSIS — I10 Essential (primary) hypertension: Secondary | ICD-10-CM | POA: Diagnosis not present

## 2018-05-06 DIAGNOSIS — S81811D Laceration without foreign body, right lower leg, subsequent encounter: Secondary | ICD-10-CM | POA: Diagnosis not present

## 2018-05-06 DIAGNOSIS — S81801D Unspecified open wound, right lower leg, subsequent encounter: Secondary | ICD-10-CM | POA: Diagnosis not present

## 2018-05-09 DIAGNOSIS — S81801D Unspecified open wound, right lower leg, subsequent encounter: Secondary | ICD-10-CM | POA: Diagnosis not present

## 2018-05-09 DIAGNOSIS — I10 Essential (primary) hypertension: Secondary | ICD-10-CM | POA: Diagnosis not present

## 2018-05-09 DIAGNOSIS — S81811D Laceration without foreign body, right lower leg, subsequent encounter: Secondary | ICD-10-CM | POA: Diagnosis not present

## 2018-05-09 DIAGNOSIS — F0390 Unspecified dementia without behavioral disturbance: Secondary | ICD-10-CM | POA: Diagnosis not present

## 2018-05-09 DIAGNOSIS — S81012D Laceration without foreign body, left knee, subsequent encounter: Secondary | ICD-10-CM | POA: Diagnosis not present

## 2018-05-09 DIAGNOSIS — I4891 Unspecified atrial fibrillation: Secondary | ICD-10-CM | POA: Diagnosis not present

## 2018-05-11 ENCOUNTER — Encounter: Payer: Self-pay | Admitting: Cardiology

## 2018-05-12 DIAGNOSIS — S81811D Laceration without foreign body, right lower leg, subsequent encounter: Secondary | ICD-10-CM | POA: Diagnosis not present

## 2018-05-12 DIAGNOSIS — I4891 Unspecified atrial fibrillation: Secondary | ICD-10-CM | POA: Diagnosis not present

## 2018-05-12 DIAGNOSIS — S81801D Unspecified open wound, right lower leg, subsequent encounter: Secondary | ICD-10-CM | POA: Diagnosis not present

## 2018-05-12 DIAGNOSIS — F0390 Unspecified dementia without behavioral disturbance: Secondary | ICD-10-CM | POA: Diagnosis not present

## 2018-05-12 DIAGNOSIS — S81012D Laceration without foreign body, left knee, subsequent encounter: Secondary | ICD-10-CM | POA: Diagnosis not present

## 2018-05-12 DIAGNOSIS — I10 Essential (primary) hypertension: Secondary | ICD-10-CM | POA: Diagnosis not present

## 2018-05-13 DIAGNOSIS — F0390 Unspecified dementia without behavioral disturbance: Secondary | ICD-10-CM | POA: Diagnosis not present

## 2018-05-13 DIAGNOSIS — I4891 Unspecified atrial fibrillation: Secondary | ICD-10-CM | POA: Diagnosis not present

## 2018-05-13 DIAGNOSIS — S81811D Laceration without foreign body, right lower leg, subsequent encounter: Secondary | ICD-10-CM | POA: Diagnosis not present

## 2018-05-13 DIAGNOSIS — S81801D Unspecified open wound, right lower leg, subsequent encounter: Secondary | ICD-10-CM | POA: Diagnosis not present

## 2018-05-13 DIAGNOSIS — S81012D Laceration without foreign body, left knee, subsequent encounter: Secondary | ICD-10-CM | POA: Diagnosis not present

## 2018-05-13 DIAGNOSIS — I10 Essential (primary) hypertension: Secondary | ICD-10-CM | POA: Diagnosis not present

## 2018-05-16 DIAGNOSIS — I10 Essential (primary) hypertension: Secondary | ICD-10-CM | POA: Diagnosis not present

## 2018-05-16 DIAGNOSIS — F0390 Unspecified dementia without behavioral disturbance: Secondary | ICD-10-CM | POA: Diagnosis not present

## 2018-05-16 DIAGNOSIS — I4891 Unspecified atrial fibrillation: Secondary | ICD-10-CM | POA: Diagnosis not present

## 2018-05-16 DIAGNOSIS — S81811D Laceration without foreign body, right lower leg, subsequent encounter: Secondary | ICD-10-CM | POA: Diagnosis not present

## 2018-05-16 DIAGNOSIS — E782 Mixed hyperlipidemia: Secondary | ICD-10-CM | POA: Diagnosis not present

## 2018-05-16 DIAGNOSIS — S81801D Unspecified open wound, right lower leg, subsequent encounter: Secondary | ICD-10-CM | POA: Diagnosis not present

## 2018-05-16 DIAGNOSIS — E039 Hypothyroidism, unspecified: Secondary | ICD-10-CM | POA: Diagnosis not present

## 2018-05-16 DIAGNOSIS — E559 Vitamin D deficiency, unspecified: Secondary | ICD-10-CM | POA: Diagnosis not present

## 2018-05-16 DIAGNOSIS — S81012D Laceration without foreign body, left knee, subsequent encounter: Secondary | ICD-10-CM | POA: Diagnosis not present

## 2018-05-17 DIAGNOSIS — B351 Tinea unguium: Secondary | ICD-10-CM | POA: Diagnosis not present

## 2018-05-17 DIAGNOSIS — I739 Peripheral vascular disease, unspecified: Secondary | ICD-10-CM | POA: Diagnosis not present

## 2018-05-17 DIAGNOSIS — M79675 Pain in left toe(s): Secondary | ICD-10-CM | POA: Diagnosis not present

## 2018-05-17 DIAGNOSIS — L600 Ingrowing nail: Secondary | ICD-10-CM | POA: Diagnosis not present

## 2018-05-17 DIAGNOSIS — M79674 Pain in right toe(s): Secondary | ICD-10-CM | POA: Diagnosis not present

## 2018-05-19 DIAGNOSIS — S81811D Laceration without foreign body, right lower leg, subsequent encounter: Secondary | ICD-10-CM | POA: Diagnosis not present

## 2018-05-19 DIAGNOSIS — F0390 Unspecified dementia without behavioral disturbance: Secondary | ICD-10-CM | POA: Diagnosis not present

## 2018-05-19 DIAGNOSIS — I10 Essential (primary) hypertension: Secondary | ICD-10-CM | POA: Diagnosis not present

## 2018-05-19 DIAGNOSIS — S81012D Laceration without foreign body, left knee, subsequent encounter: Secondary | ICD-10-CM | POA: Diagnosis not present

## 2018-05-19 DIAGNOSIS — S81801D Unspecified open wound, right lower leg, subsequent encounter: Secondary | ICD-10-CM | POA: Diagnosis not present

## 2018-05-19 DIAGNOSIS — I4891 Unspecified atrial fibrillation: Secondary | ICD-10-CM | POA: Diagnosis not present

## 2018-05-24 DIAGNOSIS — S81811D Laceration without foreign body, right lower leg, subsequent encounter: Secondary | ICD-10-CM | POA: Diagnosis not present

## 2018-05-24 DIAGNOSIS — S81801D Unspecified open wound, right lower leg, subsequent encounter: Secondary | ICD-10-CM | POA: Diagnosis not present

## 2018-05-24 DIAGNOSIS — S81012D Laceration without foreign body, left knee, subsequent encounter: Secondary | ICD-10-CM | POA: Diagnosis not present

## 2018-05-24 DIAGNOSIS — F0390 Unspecified dementia without behavioral disturbance: Secondary | ICD-10-CM | POA: Diagnosis not present

## 2018-05-24 DIAGNOSIS — I4891 Unspecified atrial fibrillation: Secondary | ICD-10-CM | POA: Diagnosis not present

## 2018-05-24 DIAGNOSIS — I10 Essential (primary) hypertension: Secondary | ICD-10-CM | POA: Diagnosis not present

## 2018-05-26 DIAGNOSIS — S81012D Laceration without foreign body, left knee, subsequent encounter: Secondary | ICD-10-CM | POA: Diagnosis not present

## 2018-05-26 DIAGNOSIS — S81811D Laceration without foreign body, right lower leg, subsequent encounter: Secondary | ICD-10-CM | POA: Diagnosis not present

## 2018-05-26 DIAGNOSIS — I4891 Unspecified atrial fibrillation: Secondary | ICD-10-CM | POA: Diagnosis not present

## 2018-05-26 DIAGNOSIS — S81801D Unspecified open wound, right lower leg, subsequent encounter: Secondary | ICD-10-CM | POA: Diagnosis not present

## 2018-05-26 DIAGNOSIS — F0390 Unspecified dementia without behavioral disturbance: Secondary | ICD-10-CM | POA: Diagnosis not present

## 2018-05-26 DIAGNOSIS — I10 Essential (primary) hypertension: Secondary | ICD-10-CM | POA: Diagnosis not present

## 2018-05-27 DIAGNOSIS — R634 Abnormal weight loss: Secondary | ICD-10-CM | POA: Diagnosis not present

## 2018-05-31 DIAGNOSIS — I4891 Unspecified atrial fibrillation: Secondary | ICD-10-CM | POA: Diagnosis not present

## 2018-05-31 DIAGNOSIS — S81801D Unspecified open wound, right lower leg, subsequent encounter: Secondary | ICD-10-CM | POA: Diagnosis not present

## 2018-05-31 DIAGNOSIS — S81012D Laceration without foreign body, left knee, subsequent encounter: Secondary | ICD-10-CM | POA: Diagnosis not present

## 2018-05-31 DIAGNOSIS — F0390 Unspecified dementia without behavioral disturbance: Secondary | ICD-10-CM | POA: Diagnosis not present

## 2018-05-31 DIAGNOSIS — I10 Essential (primary) hypertension: Secondary | ICD-10-CM | POA: Diagnosis not present

## 2018-05-31 DIAGNOSIS — S81811D Laceration without foreign body, right lower leg, subsequent encounter: Secondary | ICD-10-CM | POA: Diagnosis not present

## 2018-06-02 DIAGNOSIS — I10 Essential (primary) hypertension: Secondary | ICD-10-CM | POA: Diagnosis not present

## 2018-06-02 DIAGNOSIS — S81801D Unspecified open wound, right lower leg, subsequent encounter: Secondary | ICD-10-CM | POA: Diagnosis not present

## 2018-06-02 DIAGNOSIS — I4891 Unspecified atrial fibrillation: Secondary | ICD-10-CM | POA: Diagnosis not present

## 2018-06-02 DIAGNOSIS — F0390 Unspecified dementia without behavioral disturbance: Secondary | ICD-10-CM | POA: Diagnosis not present

## 2018-06-02 DIAGNOSIS — S81012D Laceration without foreign body, left knee, subsequent encounter: Secondary | ICD-10-CM | POA: Diagnosis not present

## 2018-06-02 DIAGNOSIS — S81811D Laceration without foreign body, right lower leg, subsequent encounter: Secondary | ICD-10-CM | POA: Diagnosis not present

## 2018-06-15 DIAGNOSIS — L853 Xerosis cutis: Secondary | ICD-10-CM | POA: Diagnosis not present

## 2018-06-15 DIAGNOSIS — I739 Peripheral vascular disease, unspecified: Secondary | ICD-10-CM | POA: Diagnosis not present

## 2018-06-15 DIAGNOSIS — Q845 Enlarged and hypertrophic nails: Secondary | ICD-10-CM | POA: Diagnosis not present

## 2018-06-15 DIAGNOSIS — M2010 Hallux valgus (acquired), unspecified foot: Secondary | ICD-10-CM | POA: Diagnosis not present

## 2018-06-15 DIAGNOSIS — L603 Nail dystrophy: Secondary | ICD-10-CM | POA: Diagnosis not present

## 2018-06-15 DIAGNOSIS — B351 Tinea unguium: Secondary | ICD-10-CM | POA: Diagnosis not present

## 2018-06-22 DIAGNOSIS — R945 Abnormal results of liver function studies: Secondary | ICD-10-CM | POA: Diagnosis not present

## 2018-06-22 DIAGNOSIS — E785 Hyperlipidemia, unspecified: Secondary | ICD-10-CM | POA: Diagnosis not present

## 2018-06-22 DIAGNOSIS — E559 Vitamin D deficiency, unspecified: Secondary | ICD-10-CM | POA: Diagnosis not present

## 2018-06-22 DIAGNOSIS — F09 Unspecified mental disorder due to known physiological condition: Secondary | ICD-10-CM | POA: Diagnosis not present

## 2018-06-22 DIAGNOSIS — E119 Type 2 diabetes mellitus without complications: Secondary | ICD-10-CM | POA: Diagnosis not present

## 2018-06-22 DIAGNOSIS — E039 Hypothyroidism, unspecified: Secondary | ICD-10-CM | POA: Diagnosis not present

## 2018-06-22 DIAGNOSIS — R1310 Dysphagia, unspecified: Secondary | ICD-10-CM | POA: Diagnosis not present

## 2018-06-22 DIAGNOSIS — I4891 Unspecified atrial fibrillation: Secondary | ICD-10-CM | POA: Diagnosis not present

## 2018-06-22 DIAGNOSIS — I10 Essential (primary) hypertension: Secondary | ICD-10-CM | POA: Diagnosis not present

## 2018-06-22 DIAGNOSIS — R296 Repeated falls: Secondary | ICD-10-CM | POA: Diagnosis not present

## 2018-06-22 DIAGNOSIS — K219 Gastro-esophageal reflux disease without esophagitis: Secondary | ICD-10-CM | POA: Diagnosis not present

## 2018-07-08 DIAGNOSIS — R0602 Shortness of breath: Secondary | ICD-10-CM | POA: Diagnosis not present

## 2018-07-08 DIAGNOSIS — Z79899 Other long term (current) drug therapy: Secondary | ICD-10-CM | POA: Diagnosis not present

## 2018-07-08 DIAGNOSIS — Z7901 Long term (current) use of anticoagulants: Secondary | ICD-10-CM | POA: Diagnosis not present

## 2018-07-08 DIAGNOSIS — J90 Pleural effusion, not elsewhere classified: Secondary | ICD-10-CM | POA: Diagnosis not present

## 2018-07-08 DIAGNOSIS — Z952 Presence of prosthetic heart valve: Secondary | ICD-10-CM | POA: Diagnosis not present

## 2018-07-08 DIAGNOSIS — J811 Chronic pulmonary edema: Secondary | ICD-10-CM | POA: Diagnosis not present

## 2018-07-08 DIAGNOSIS — I4891 Unspecified atrial fibrillation: Secondary | ICD-10-CM | POA: Diagnosis not present

## 2018-07-08 DIAGNOSIS — E785 Hyperlipidemia, unspecified: Secondary | ICD-10-CM | POA: Diagnosis not present

## 2018-07-08 DIAGNOSIS — I517 Cardiomegaly: Secondary | ICD-10-CM | POA: Diagnosis not present

## 2018-07-08 DIAGNOSIS — Z87891 Personal history of nicotine dependence: Secondary | ICD-10-CM | POA: Diagnosis not present

## 2018-07-08 DIAGNOSIS — Z950 Presence of cardiac pacemaker: Secondary | ICD-10-CM | POA: Diagnosis not present

## 2018-07-08 DIAGNOSIS — I5031 Acute diastolic (congestive) heart failure: Secondary | ICD-10-CM | POA: Diagnosis not present

## 2018-07-08 DIAGNOSIS — I10 Essential (primary) hypertension: Secondary | ICD-10-CM | POA: Diagnosis not present

## 2018-07-08 DIAGNOSIS — R6889 Other general symptoms and signs: Secondary | ICD-10-CM | POA: Diagnosis not present

## 2018-07-15 DIAGNOSIS — I10 Essential (primary) hypertension: Secondary | ICD-10-CM | POA: Diagnosis not present

## 2018-07-15 DIAGNOSIS — N390 Urinary tract infection, site not specified: Secondary | ICD-10-CM | POA: Diagnosis not present

## 2018-07-18 DIAGNOSIS — E785 Hyperlipidemia, unspecified: Secondary | ICD-10-CM | POA: Diagnosis not present

## 2018-07-18 DIAGNOSIS — E119 Type 2 diabetes mellitus without complications: Secondary | ICD-10-CM | POA: Diagnosis not present

## 2018-07-18 DIAGNOSIS — D649 Anemia, unspecified: Secondary | ICD-10-CM | POA: Diagnosis not present

## 2018-07-18 DIAGNOSIS — E039 Hypothyroidism, unspecified: Secondary | ICD-10-CM | POA: Diagnosis not present

## 2018-07-18 DIAGNOSIS — E878 Other disorders of electrolyte and fluid balance, not elsewhere classified: Secondary | ICD-10-CM | POA: Diagnosis not present

## 2018-07-22 DIAGNOSIS — Z952 Presence of prosthetic heart valve: Secondary | ICD-10-CM | POA: Diagnosis not present

## 2018-07-22 DIAGNOSIS — I10 Essential (primary) hypertension: Secondary | ICD-10-CM | POA: Diagnosis not present

## 2018-07-22 DIAGNOSIS — I25118 Atherosclerotic heart disease of native coronary artery with other forms of angina pectoris: Secondary | ICD-10-CM | POA: Diagnosis not present

## 2018-07-22 DIAGNOSIS — I480 Paroxysmal atrial fibrillation: Secondary | ICD-10-CM | POA: Diagnosis not present

## 2018-07-27 DIAGNOSIS — K219 Gastro-esophageal reflux disease without esophagitis: Secondary | ICD-10-CM | POA: Diagnosis not present

## 2018-07-27 DIAGNOSIS — E559 Vitamin D deficiency, unspecified: Secondary | ICD-10-CM | POA: Diagnosis not present

## 2018-07-27 DIAGNOSIS — Z8249 Family history of ischemic heart disease and other diseases of the circulatory system: Secondary | ICD-10-CM | POA: Diagnosis not present

## 2018-07-27 DIAGNOSIS — E785 Hyperlipidemia, unspecified: Secondary | ICD-10-CM | POA: Diagnosis not present

## 2018-07-27 DIAGNOSIS — D649 Anemia, unspecified: Secondary | ICD-10-CM | POA: Diagnosis not present

## 2018-07-27 DIAGNOSIS — I4891 Unspecified atrial fibrillation: Secondary | ICD-10-CM | POA: Diagnosis not present

## 2018-07-27 DIAGNOSIS — I10 Essential (primary) hypertension: Secondary | ICD-10-CM | POA: Diagnosis not present

## 2018-07-27 DIAGNOSIS — Z79899 Other long term (current) drug therapy: Secondary | ICD-10-CM | POA: Diagnosis not present

## 2018-07-27 DIAGNOSIS — Z888 Allergy status to other drugs, medicaments and biological substances status: Secondary | ICD-10-CM | POA: Diagnosis not present

## 2018-07-27 DIAGNOSIS — Z950 Presence of cardiac pacemaker: Secondary | ICD-10-CM | POA: Diagnosis not present

## 2018-07-27 DIAGNOSIS — Z7901 Long term (current) use of anticoagulants: Secondary | ICD-10-CM | POA: Diagnosis not present

## 2018-07-27 DIAGNOSIS — Z87891 Personal history of nicotine dependence: Secondary | ICD-10-CM | POA: Diagnosis not present

## 2018-08-02 DIAGNOSIS — I5032 Chronic diastolic (congestive) heart failure: Secondary | ICD-10-CM | POA: Diagnosis not present

## 2018-08-02 DIAGNOSIS — D649 Anemia, unspecified: Secondary | ICD-10-CM | POA: Diagnosis not present

## 2018-08-11 IMAGING — MR MR MRA HEAD W/O CM
11 of 13 series · 33 of 48 positions shown · non-contrast
Comparison: Head CT 10/30/2017

CLINICAL DATA: Confusion.  Atrial fibrillation.

EXAM:
MRI HEAD WITHOUT CONTRAST
MRA HEAD WITHOUT CONTRAST
TECHNIQUE: Multiplanar, multiecho pulse sequences of the brain and surrounding
structures were obtained without intravenous contrast. Angiographic
images of the head were obtained using MRA technique without
contrast.

[Series 5001: DWI · axial · 4.0mm · 0.92mm/px · z∈[-40,+91]mm · 5 of 68 slices shown (1 of 2)]
[im 1/68]
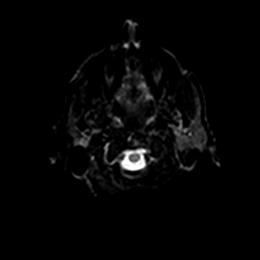
[im 17/68]
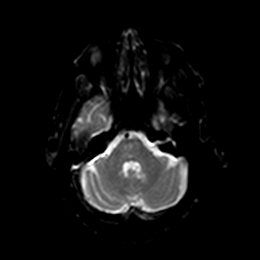
[im 34/68]
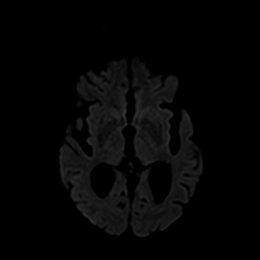
[im 51/68]
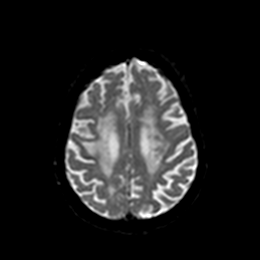
[im 68/68]
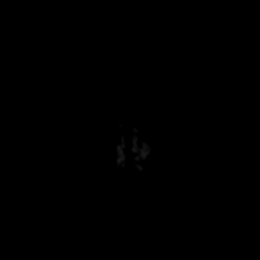

[Series 6001: DWI · axial · 4.0mm · 0.92mm/px · z∈[-40,+91]mm · 3 of 34 slices shown (2 of 2)]
[im 1/34]
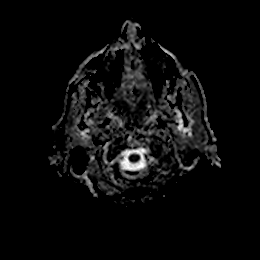
[im 17/34]
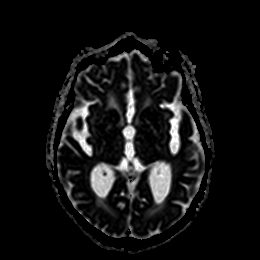
[im 34/34]
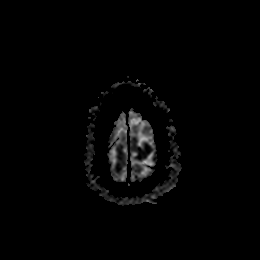

[Series 7001: swi_images · axial · 3.0mm · 0.94mm/px · z∈[-51,+88]mm · 4 of 48 slices shown]
[im 1/48]
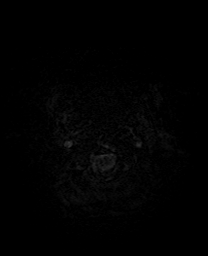
[im 16/48]
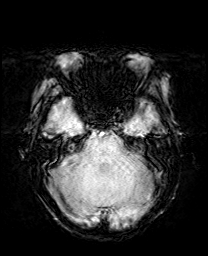
[im 32/48]
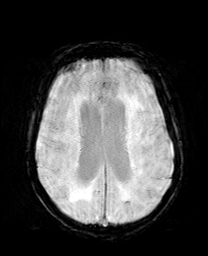
[im 48/48]
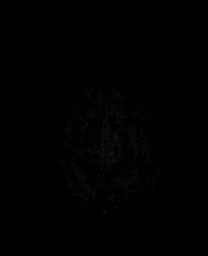

[Series 8001: mip_images(sw) · axial · 24.0mm · 0.94mm/px · z∈[-41,+78]mm · 3 of 41 slices shown]
[im 1/41]
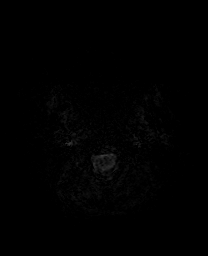
[im 21/41]
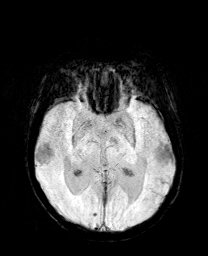
[im 41/41]
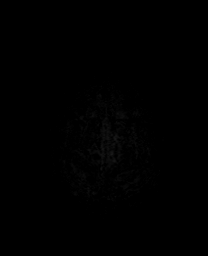

[Series 9001: T2 · axial · 5.0mm · 0.75mm/px · z∈[-56,+86]mm · 2 of 25 slices shown]
[im 1/25]
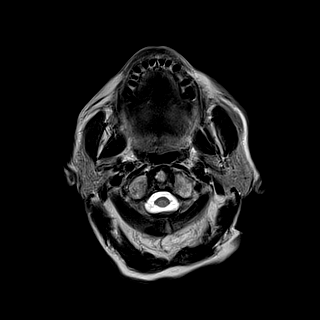
[im 25/25]
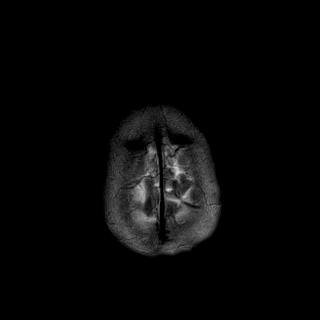

[FLAIR · axial · 5.0mm · 0.47mm/px · z∈[-57,+86]mm · 2 of 25 slices shown]
[im 1/25]
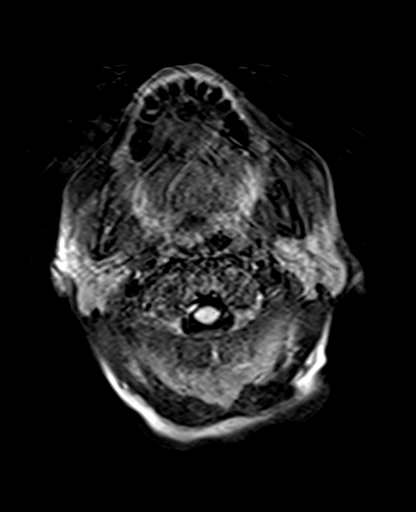
[im 25/25]
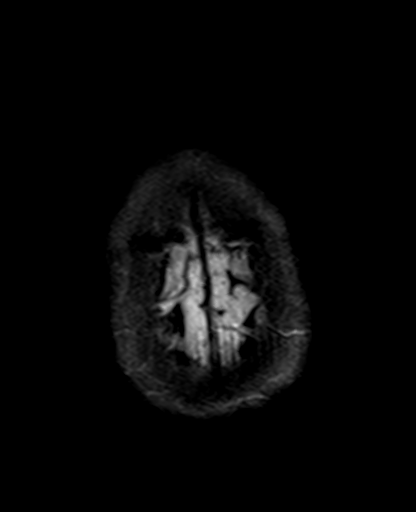

[cor dwi_tracew · coronal · 5.0mm · 1.44mm/px · 5 of 64 slices shown]
[im 1/64]
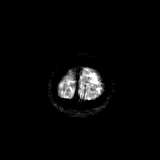
[im 16/64]
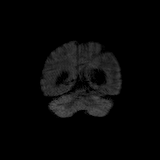
[im 32/64]
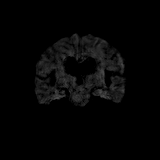
[im 48/64]
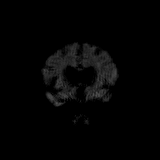
[im 64/64]
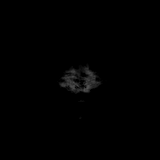

[cor dwi_adc · coronal · 5.0mm · 1.44mm/px · 3 of 32 slices shown]
[im 1/32]
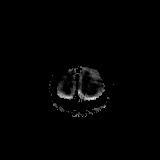
[im 16/32]
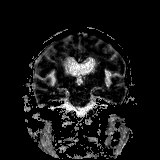
[im 32/32]
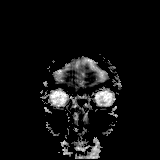

[T1 · sagittal · 5.0mm · 0.75mm/px · 2 of 23 slices shown (1 of 2)]
[im 1/23]
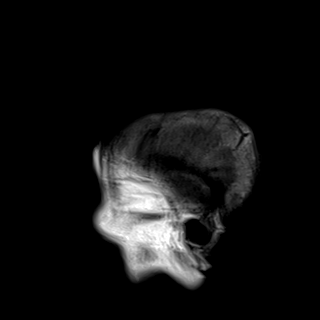
[im 23/23]
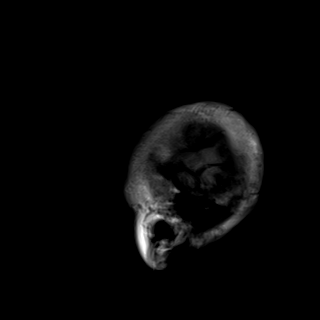

[T1 · axial · 5.0mm · 0.38mm/px · z∈[-71,+116]mm · 2 of 28 slices shown (2 of 2)]
[im 1/28]
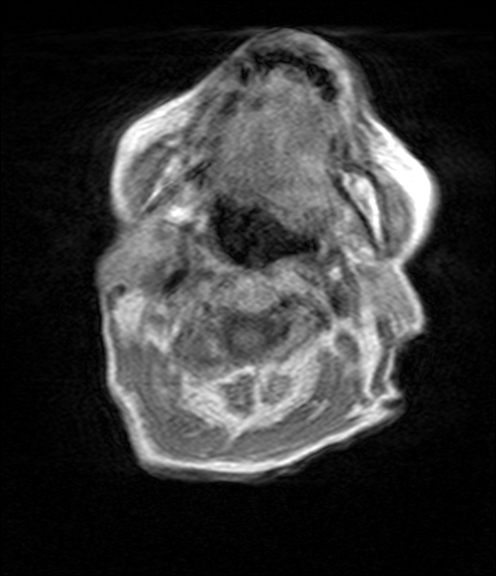
[im 28/28]
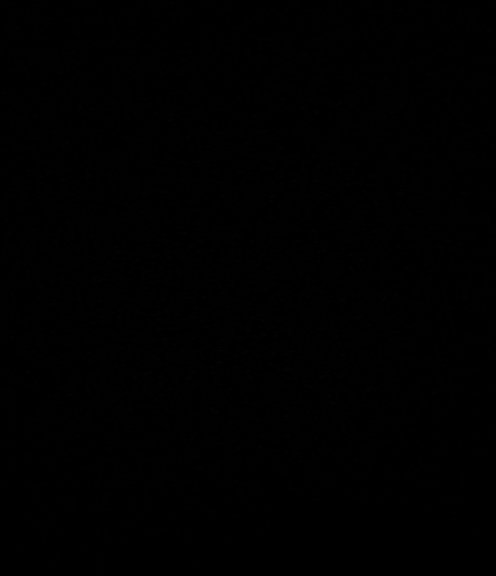

[T2 post-contrast · coronal · 5.0mm · 0.72mm/px · 2 of 28 slices shown]
[im 1/28]
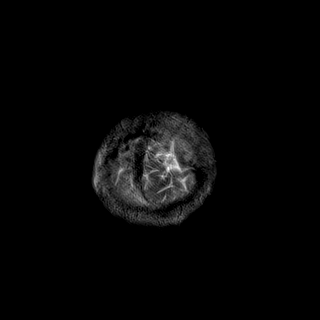
[im 28/28]
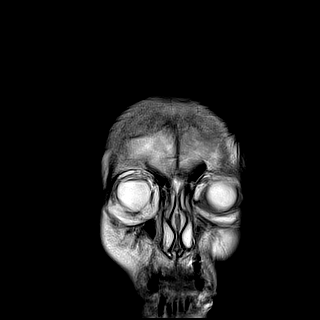

[33 of 48 positions shown; findings below may reference images not displayed]

FINDINGS: MRI HEAD FINDINGS

The study is motion degraded throughout, with some sequences being
moderately to severely degraded.

Brain: There is a small acute infarct in the right cerebellum.
Punctate foci of mild trace diffusion hyperintensity in the left
frontal white matter and right body of the corpus callosum also
likely represent acute infarcts. A chronic microhemorrhage is noted
in the right parietal lobe. There is moderate cerebral atrophy.
Patchy to confluent T2 hyperintensities throughout the cerebral
white matter and pons are nonspecific but compatible with moderately
extensive chronic small vessel ischemic disease. There are small
chronic infarcts in the left cerebellum.

Vascular: Major intracranial vascular flow voids are preserved.

Skull and upper cervical spine: No suspicious marrow lesion.

Sinuses/Orbits: Bilateral cataract extraction. Paranasal sinuses and
mastoid air cells are clear.

Other: None.

MRA HEAD FINDINGS

The study is severely motion degraded and only marginally
diagnostic. There is gross flow in the distal intracranial vertebral
arteries with the left appearing mildly dominant. The basilar artery
is patent without evidence of significant stenosis allowing for
focal motion artifact through its midportion. The PCAs are grossly
patent with motion artifact precluding assessment for stenosis.

The internal carotid arteries are patent from skull base to carotid
termini without convincing high-grade stenosis allowing for motion
artifact. The ACAs and MCAs are grossly patent with signal loss in
both distal M1 segments favored to be artifactual given the presence
of flow in more distal branch vessels.
IMPRESSION: 1. Small acute infarcts in the right cerebellum, left frontal white
matter, and corpus callosum.
2. Moderately extensive chronic small vessel ischemic disease.
3. Chronic left cerebellar infarcts.
4. Severely motion degraded head MRA.  Patent large vessels.

## 2018-08-19 DIAGNOSIS — S90934A Unspecified superficial injury of right lesser toe(s), initial encounter: Secondary | ICD-10-CM | POA: Diagnosis not present

## 2018-08-23 DIAGNOSIS — Z950 Presence of cardiac pacemaker: Secondary | ICD-10-CM | POA: Diagnosis not present

## 2018-08-23 DIAGNOSIS — Z952 Presence of prosthetic heart valve: Secondary | ICD-10-CM | POA: Diagnosis not present

## 2018-08-23 DIAGNOSIS — I5032 Chronic diastolic (congestive) heart failure: Secondary | ICD-10-CM | POA: Diagnosis not present

## 2018-08-23 DIAGNOSIS — I480 Paroxysmal atrial fibrillation: Secondary | ICD-10-CM | POA: Diagnosis not present

## 2018-08-24 DIAGNOSIS — I10 Essential (primary) hypertension: Secondary | ICD-10-CM | POA: Diagnosis not present

## 2018-08-26 DIAGNOSIS — Z0000 Encounter for general adult medical examination without abnormal findings: Secondary | ICD-10-CM | POA: Diagnosis not present

## 2018-08-26 DIAGNOSIS — I10 Essential (primary) hypertension: Secondary | ICD-10-CM | POA: Diagnosis not present

## 2018-08-30 DIAGNOSIS — Z45010 Encounter for checking and testing of cardiac pacemaker pulse generator [battery]: Secondary | ICD-10-CM | POA: Diagnosis not present

## 2018-08-30 DIAGNOSIS — S0191XA Laceration without foreign body of unspecified part of head, initial encounter: Secondary | ICD-10-CM | POA: Diagnosis not present

## 2018-08-30 DIAGNOSIS — I6782 Cerebral ischemia: Secondary | ICD-10-CM | POA: Diagnosis not present

## 2018-08-30 DIAGNOSIS — Z87891 Personal history of nicotine dependence: Secondary | ICD-10-CM | POA: Diagnosis not present

## 2018-08-30 DIAGNOSIS — S0003XA Contusion of scalp, initial encounter: Secondary | ICD-10-CM | POA: Diagnosis not present

## 2018-08-30 DIAGNOSIS — Z79899 Other long term (current) drug therapy: Secondary | ICD-10-CM | POA: Diagnosis not present

## 2018-08-30 DIAGNOSIS — R6889 Other general symptoms and signs: Secondary | ICD-10-CM | POA: Diagnosis not present

## 2018-08-31 DIAGNOSIS — S0101XA Laceration without foreign body of scalp, initial encounter: Secondary | ICD-10-CM | POA: Diagnosis not present

## 2018-08-31 DIAGNOSIS — R296 Repeated falls: Secondary | ICD-10-CM | POA: Diagnosis not present

## 2018-09-01 DIAGNOSIS — Z9181 History of falling: Secondary | ICD-10-CM | POA: Diagnosis not present

## 2018-09-01 DIAGNOSIS — S51812D Laceration without foreign body of left forearm, subsequent encounter: Secondary | ICD-10-CM | POA: Diagnosis not present

## 2018-09-01 DIAGNOSIS — F0390 Unspecified dementia without behavioral disturbance: Secondary | ICD-10-CM | POA: Diagnosis not present

## 2018-09-01 DIAGNOSIS — I10 Essential (primary) hypertension: Secondary | ICD-10-CM | POA: Diagnosis not present

## 2018-09-01 DIAGNOSIS — Z8673 Personal history of transient ischemic attack (TIA), and cerebral infarction without residual deficits: Secondary | ICD-10-CM | POA: Diagnosis not present

## 2018-09-01 DIAGNOSIS — S0101XD Laceration without foreign body of scalp, subsequent encounter: Secondary | ICD-10-CM | POA: Diagnosis not present

## 2018-09-01 DIAGNOSIS — I4891 Unspecified atrial fibrillation: Secondary | ICD-10-CM | POA: Diagnosis not present

## 2018-09-01 DIAGNOSIS — Z7901 Long term (current) use of anticoagulants: Secondary | ICD-10-CM | POA: Diagnosis not present

## 2018-09-05 DIAGNOSIS — F0390 Unspecified dementia without behavioral disturbance: Secondary | ICD-10-CM | POA: Diagnosis not present

## 2018-09-05 DIAGNOSIS — S0101XD Laceration without foreign body of scalp, subsequent encounter: Secondary | ICD-10-CM | POA: Diagnosis not present

## 2018-09-05 DIAGNOSIS — S51812D Laceration without foreign body of left forearm, subsequent encounter: Secondary | ICD-10-CM | POA: Diagnosis not present

## 2018-09-05 DIAGNOSIS — I10 Essential (primary) hypertension: Secondary | ICD-10-CM | POA: Diagnosis not present

## 2018-09-05 DIAGNOSIS — I4891 Unspecified atrial fibrillation: Secondary | ICD-10-CM | POA: Diagnosis not present

## 2018-09-05 DIAGNOSIS — Z7901 Long term (current) use of anticoagulants: Secondary | ICD-10-CM | POA: Diagnosis not present

## 2018-09-07 DIAGNOSIS — S51812D Laceration without foreign body of left forearm, subsequent encounter: Secondary | ICD-10-CM | POA: Diagnosis not present

## 2018-09-07 DIAGNOSIS — I10 Essential (primary) hypertension: Secondary | ICD-10-CM | POA: Diagnosis not present

## 2018-09-07 DIAGNOSIS — S0101XD Laceration without foreign body of scalp, subsequent encounter: Secondary | ICD-10-CM | POA: Diagnosis not present

## 2018-09-07 DIAGNOSIS — Z7901 Long term (current) use of anticoagulants: Secondary | ICD-10-CM | POA: Diagnosis not present

## 2018-09-07 DIAGNOSIS — I4891 Unspecified atrial fibrillation: Secondary | ICD-10-CM | POA: Diagnosis not present

## 2018-09-07 DIAGNOSIS — F0390 Unspecified dementia without behavioral disturbance: Secondary | ICD-10-CM | POA: Diagnosis not present

## 2018-09-08 DIAGNOSIS — I10 Essential (primary) hypertension: Secondary | ICD-10-CM | POA: Diagnosis not present

## 2018-09-08 DIAGNOSIS — Z7901 Long term (current) use of anticoagulants: Secondary | ICD-10-CM | POA: Diagnosis not present

## 2018-09-08 DIAGNOSIS — S0101XD Laceration without foreign body of scalp, subsequent encounter: Secondary | ICD-10-CM | POA: Diagnosis not present

## 2018-09-08 DIAGNOSIS — I4891 Unspecified atrial fibrillation: Secondary | ICD-10-CM | POA: Diagnosis not present

## 2018-09-08 DIAGNOSIS — S51812D Laceration without foreign body of left forearm, subsequent encounter: Secondary | ICD-10-CM | POA: Diagnosis not present

## 2018-09-08 DIAGNOSIS — F0390 Unspecified dementia without behavioral disturbance: Secondary | ICD-10-CM | POA: Diagnosis not present

## 2018-09-09 DIAGNOSIS — I10 Essential (primary) hypertension: Secondary | ICD-10-CM | POA: Diagnosis not present

## 2018-09-09 DIAGNOSIS — S51812D Laceration without foreign body of left forearm, subsequent encounter: Secondary | ICD-10-CM | POA: Diagnosis not present

## 2018-09-09 DIAGNOSIS — F0390 Unspecified dementia without behavioral disturbance: Secondary | ICD-10-CM | POA: Diagnosis not present

## 2018-09-09 DIAGNOSIS — Z7901 Long term (current) use of anticoagulants: Secondary | ICD-10-CM | POA: Diagnosis not present

## 2018-09-09 DIAGNOSIS — S0101XD Laceration without foreign body of scalp, subsequent encounter: Secondary | ICD-10-CM | POA: Diagnosis not present

## 2018-09-09 DIAGNOSIS — I4891 Unspecified atrial fibrillation: Secondary | ICD-10-CM | POA: Diagnosis not present

## 2018-09-14 DIAGNOSIS — I4891 Unspecified atrial fibrillation: Secondary | ICD-10-CM | POA: Diagnosis not present

## 2018-09-14 DIAGNOSIS — S0101XD Laceration without foreign body of scalp, subsequent encounter: Secondary | ICD-10-CM | POA: Diagnosis not present

## 2018-09-14 DIAGNOSIS — I10 Essential (primary) hypertension: Secondary | ICD-10-CM | POA: Diagnosis not present

## 2018-09-14 DIAGNOSIS — F0390 Unspecified dementia without behavioral disturbance: Secondary | ICD-10-CM | POA: Diagnosis not present

## 2018-09-14 DIAGNOSIS — Z7901 Long term (current) use of anticoagulants: Secondary | ICD-10-CM | POA: Diagnosis not present

## 2018-09-14 DIAGNOSIS — S51812D Laceration without foreign body of left forearm, subsequent encounter: Secondary | ICD-10-CM | POA: Diagnosis not present

## 2018-09-16 DIAGNOSIS — S51812D Laceration without foreign body of left forearm, subsequent encounter: Secondary | ICD-10-CM | POA: Diagnosis not present

## 2018-09-16 DIAGNOSIS — I10 Essential (primary) hypertension: Secondary | ICD-10-CM | POA: Diagnosis not present

## 2018-09-16 DIAGNOSIS — F0390 Unspecified dementia without behavioral disturbance: Secondary | ICD-10-CM | POA: Diagnosis not present

## 2018-09-16 DIAGNOSIS — S0101XD Laceration without foreign body of scalp, subsequent encounter: Secondary | ICD-10-CM | POA: Diagnosis not present

## 2018-09-16 DIAGNOSIS — Z7901 Long term (current) use of anticoagulants: Secondary | ICD-10-CM | POA: Diagnosis not present

## 2018-09-16 DIAGNOSIS — I4891 Unspecified atrial fibrillation: Secondary | ICD-10-CM | POA: Diagnosis not present

## 2018-09-21 DIAGNOSIS — I10 Essential (primary) hypertension: Secondary | ICD-10-CM | POA: Diagnosis not present

## 2018-09-21 DIAGNOSIS — Z7901 Long term (current) use of anticoagulants: Secondary | ICD-10-CM | POA: Diagnosis not present

## 2018-09-21 DIAGNOSIS — F0390 Unspecified dementia without behavioral disturbance: Secondary | ICD-10-CM | POA: Diagnosis not present

## 2018-09-21 DIAGNOSIS — I4891 Unspecified atrial fibrillation: Secondary | ICD-10-CM | POA: Diagnosis not present

## 2018-09-21 DIAGNOSIS — S0101XD Laceration without foreign body of scalp, subsequent encounter: Secondary | ICD-10-CM | POA: Diagnosis not present

## 2018-09-21 DIAGNOSIS — S51812D Laceration without foreign body of left forearm, subsequent encounter: Secondary | ICD-10-CM | POA: Diagnosis not present

## 2018-09-22 DIAGNOSIS — S51812D Laceration without foreign body of left forearm, subsequent encounter: Secondary | ICD-10-CM | POA: Diagnosis not present

## 2018-09-22 DIAGNOSIS — Z7901 Long term (current) use of anticoagulants: Secondary | ICD-10-CM | POA: Diagnosis not present

## 2018-09-22 DIAGNOSIS — S0101XD Laceration without foreign body of scalp, subsequent encounter: Secondary | ICD-10-CM | POA: Diagnosis not present

## 2018-09-22 DIAGNOSIS — F0390 Unspecified dementia without behavioral disturbance: Secondary | ICD-10-CM | POA: Diagnosis not present

## 2018-09-22 DIAGNOSIS — I10 Essential (primary) hypertension: Secondary | ICD-10-CM | POA: Diagnosis not present

## 2018-09-22 DIAGNOSIS — I4891 Unspecified atrial fibrillation: Secondary | ICD-10-CM | POA: Diagnosis not present

## 2018-09-27 DIAGNOSIS — S0101XD Laceration without foreign body of scalp, subsequent encounter: Secondary | ICD-10-CM | POA: Diagnosis not present

## 2018-09-27 DIAGNOSIS — I4891 Unspecified atrial fibrillation: Secondary | ICD-10-CM | POA: Diagnosis not present

## 2018-09-27 DIAGNOSIS — F0390 Unspecified dementia without behavioral disturbance: Secondary | ICD-10-CM | POA: Diagnosis not present

## 2018-09-27 DIAGNOSIS — Z7901 Long term (current) use of anticoagulants: Secondary | ICD-10-CM | POA: Diagnosis not present

## 2018-09-27 DIAGNOSIS — S51812D Laceration without foreign body of left forearm, subsequent encounter: Secondary | ICD-10-CM | POA: Diagnosis not present

## 2018-09-27 DIAGNOSIS — I10 Essential (primary) hypertension: Secondary | ICD-10-CM | POA: Diagnosis not present

## 2018-09-28 DIAGNOSIS — R410 Disorientation, unspecified: Secondary | ICD-10-CM | POA: Diagnosis not present

## 2018-09-30 DIAGNOSIS — Z79899 Other long term (current) drug therapy: Secondary | ICD-10-CM | POA: Diagnosis not present

## 2018-09-30 DIAGNOSIS — Z8673 Personal history of transient ischemic attack (TIA), and cerebral infarction without residual deficits: Secondary | ICD-10-CM | POA: Diagnosis not present

## 2018-09-30 DIAGNOSIS — I10 Essential (primary) hypertension: Secondary | ICD-10-CM | POA: Diagnosis not present

## 2018-09-30 DIAGNOSIS — Z950 Presence of cardiac pacemaker: Secondary | ICD-10-CM | POA: Diagnosis not present

## 2018-09-30 DIAGNOSIS — F419 Anxiety disorder, unspecified: Secondary | ICD-10-CM | POA: Diagnosis not present

## 2018-09-30 DIAGNOSIS — N390 Urinary tract infection, site not specified: Secondary | ICD-10-CM | POA: Diagnosis not present

## 2018-09-30 DIAGNOSIS — E785 Hyperlipidemia, unspecified: Secondary | ICD-10-CM | POA: Diagnosis not present

## 2018-09-30 DIAGNOSIS — I4891 Unspecified atrial fibrillation: Secondary | ICD-10-CM | POA: Diagnosis not present

## 2018-09-30 DIAGNOSIS — R42 Dizziness and giddiness: Secondary | ICD-10-CM | POA: Diagnosis not present

## 2018-09-30 DIAGNOSIS — Z87891 Personal history of nicotine dependence: Secondary | ICD-10-CM | POA: Diagnosis not present

## 2018-09-30 DIAGNOSIS — E559 Vitamin D deficiency, unspecified: Secondary | ICD-10-CM | POA: Diagnosis not present

## 2018-09-30 DIAGNOSIS — Z7901 Long term (current) use of anticoagulants: Secondary | ICD-10-CM | POA: Diagnosis not present

## 2018-10-01 DIAGNOSIS — Z9181 History of falling: Secondary | ICD-10-CM | POA: Diagnosis not present

## 2018-10-01 DIAGNOSIS — S0101XD Laceration without foreign body of scalp, subsequent encounter: Secondary | ICD-10-CM | POA: Diagnosis not present

## 2018-10-01 DIAGNOSIS — F0390 Unspecified dementia without behavioral disturbance: Secondary | ICD-10-CM | POA: Diagnosis not present

## 2018-10-01 DIAGNOSIS — I4891 Unspecified atrial fibrillation: Secondary | ICD-10-CM | POA: Diagnosis not present

## 2018-10-01 DIAGNOSIS — Z7901 Long term (current) use of anticoagulants: Secondary | ICD-10-CM | POA: Diagnosis not present

## 2018-10-01 DIAGNOSIS — Z8673 Personal history of transient ischemic attack (TIA), and cerebral infarction without residual deficits: Secondary | ICD-10-CM | POA: Diagnosis not present

## 2018-10-01 DIAGNOSIS — I10 Essential (primary) hypertension: Secondary | ICD-10-CM | POA: Diagnosis not present

## 2018-10-01 DIAGNOSIS — S51812D Laceration without foreign body of left forearm, subsequent encounter: Secondary | ICD-10-CM | POA: Diagnosis not present

## 2018-10-03 DIAGNOSIS — R410 Disorientation, unspecified: Secondary | ICD-10-CM | POA: Diagnosis not present

## 2018-10-03 DIAGNOSIS — Z8673 Personal history of transient ischemic attack (TIA), and cerebral infarction without residual deficits: Secondary | ICD-10-CM | POA: Diagnosis not present

## 2018-10-03 DIAGNOSIS — E785 Hyperlipidemia, unspecified: Secondary | ICD-10-CM | POA: Diagnosis not present

## 2018-10-03 DIAGNOSIS — F419 Anxiety disorder, unspecified: Secondary | ICD-10-CM | POA: Diagnosis not present

## 2018-10-03 DIAGNOSIS — R4182 Altered mental status, unspecified: Secondary | ICD-10-CM | POA: Diagnosis not present

## 2018-10-03 DIAGNOSIS — Z950 Presence of cardiac pacemaker: Secondary | ICD-10-CM | POA: Diagnosis not present

## 2018-10-03 DIAGNOSIS — R404 Transient alteration of awareness: Secondary | ICD-10-CM | POA: Diagnosis not present

## 2018-10-03 DIAGNOSIS — Z7901 Long term (current) use of anticoagulants: Secondary | ICD-10-CM | POA: Diagnosis not present

## 2018-10-03 DIAGNOSIS — Z87891 Personal history of nicotine dependence: Secondary | ICD-10-CM | POA: Diagnosis not present

## 2018-10-03 DIAGNOSIS — I10 Essential (primary) hypertension: Secondary | ICD-10-CM | POA: Diagnosis not present

## 2018-10-03 DIAGNOSIS — N390 Urinary tract infection, site not specified: Secondary | ICD-10-CM | POA: Diagnosis not present

## 2018-10-03 DIAGNOSIS — Z79899 Other long term (current) drug therapy: Secondary | ICD-10-CM | POA: Diagnosis not present

## 2018-10-03 DIAGNOSIS — I4891 Unspecified atrial fibrillation: Secondary | ICD-10-CM | POA: Diagnosis not present

## 2018-10-03 DIAGNOSIS — K219 Gastro-esophageal reflux disease without esophagitis: Secondary | ICD-10-CM | POA: Diagnosis not present

## 2018-10-03 DIAGNOSIS — E559 Vitamin D deficiency, unspecified: Secondary | ICD-10-CM | POA: Diagnosis not present

## 2018-10-04 DIAGNOSIS — F0390 Unspecified dementia without behavioral disturbance: Secondary | ICD-10-CM | POA: Diagnosis not present

## 2018-10-04 DIAGNOSIS — I10 Essential (primary) hypertension: Secondary | ICD-10-CM | POA: Diagnosis not present

## 2018-10-04 DIAGNOSIS — I4891 Unspecified atrial fibrillation: Secondary | ICD-10-CM | POA: Diagnosis not present

## 2018-10-04 DIAGNOSIS — S0101XD Laceration without foreign body of scalp, subsequent encounter: Secondary | ICD-10-CM | POA: Diagnosis not present

## 2018-10-04 DIAGNOSIS — Z7901 Long term (current) use of anticoagulants: Secondary | ICD-10-CM | POA: Diagnosis not present

## 2018-10-04 DIAGNOSIS — S51812D Laceration without foreign body of left forearm, subsequent encounter: Secondary | ICD-10-CM | POA: Diagnosis not present

## 2018-10-05 DIAGNOSIS — R945 Abnormal results of liver function studies: Secondary | ICD-10-CM | POA: Diagnosis not present

## 2018-10-05 DIAGNOSIS — D538 Other specified nutritional anemias: Secondary | ICD-10-CM | POA: Diagnosis not present

## 2018-10-05 DIAGNOSIS — F09 Unspecified mental disorder due to known physiological condition: Secondary | ICD-10-CM | POA: Diagnosis not present

## 2018-10-05 DIAGNOSIS — K219 Gastro-esophageal reflux disease without esophagitis: Secondary | ICD-10-CM | POA: Diagnosis not present

## 2018-10-05 DIAGNOSIS — I4891 Unspecified atrial fibrillation: Secondary | ICD-10-CM | POA: Diagnosis not present

## 2018-10-05 DIAGNOSIS — E785 Hyperlipidemia, unspecified: Secondary | ICD-10-CM | POA: Diagnosis not present

## 2018-10-05 DIAGNOSIS — E039 Hypothyroidism, unspecified: Secondary | ICD-10-CM | POA: Diagnosis not present

## 2018-10-05 DIAGNOSIS — E119 Type 2 diabetes mellitus without complications: Secondary | ICD-10-CM | POA: Diagnosis not present

## 2018-10-05 DIAGNOSIS — R1310 Dysphagia, unspecified: Secondary | ICD-10-CM | POA: Diagnosis not present

## 2018-10-05 DIAGNOSIS — E559 Vitamin D deficiency, unspecified: Secondary | ICD-10-CM | POA: Diagnosis not present

## 2018-10-05 DIAGNOSIS — R296 Repeated falls: Secondary | ICD-10-CM | POA: Diagnosis not present

## 2018-10-05 DIAGNOSIS — I10 Essential (primary) hypertension: Secondary | ICD-10-CM | POA: Diagnosis not present

## 2018-10-06 DIAGNOSIS — F064 Anxiety disorder due to known physiological condition: Secondary | ICD-10-CM | POA: Diagnosis not present

## 2018-10-06 DIAGNOSIS — F330 Major depressive disorder, recurrent, mild: Secondary | ICD-10-CM | POA: Diagnosis not present

## 2018-10-06 DIAGNOSIS — G3184 Mild cognitive impairment, so stated: Secondary | ICD-10-CM | POA: Diagnosis not present

## 2018-10-07 DIAGNOSIS — R4182 Altered mental status, unspecified: Secondary | ICD-10-CM | POA: Diagnosis not present

## 2018-10-07 DIAGNOSIS — N390 Urinary tract infection, site not specified: Secondary | ICD-10-CM | POA: Diagnosis not present

## 2018-10-08 DIAGNOSIS — F0390 Unspecified dementia without behavioral disturbance: Secondary | ICD-10-CM | POA: Diagnosis not present

## 2018-10-08 DIAGNOSIS — I4891 Unspecified atrial fibrillation: Secondary | ICD-10-CM | POA: Diagnosis not present

## 2018-10-08 DIAGNOSIS — S51812D Laceration without foreign body of left forearm, subsequent encounter: Secondary | ICD-10-CM | POA: Diagnosis not present

## 2018-10-08 DIAGNOSIS — Z7901 Long term (current) use of anticoagulants: Secondary | ICD-10-CM | POA: Diagnosis not present

## 2018-10-08 DIAGNOSIS — I10 Essential (primary) hypertension: Secondary | ICD-10-CM | POA: Diagnosis not present

## 2018-10-08 DIAGNOSIS — S0101XD Laceration without foreign body of scalp, subsequent encounter: Secondary | ICD-10-CM | POA: Diagnosis not present

## 2018-10-11 DIAGNOSIS — R4182 Altered mental status, unspecified: Secondary | ICD-10-CM | POA: Diagnosis not present

## 2018-10-11 DIAGNOSIS — F0390 Unspecified dementia without behavioral disturbance: Secondary | ICD-10-CM | POA: Diagnosis not present

## 2018-10-11 DIAGNOSIS — S0101XD Laceration without foreign body of scalp, subsequent encounter: Secondary | ICD-10-CM | POA: Diagnosis not present

## 2018-10-11 DIAGNOSIS — I10 Essential (primary) hypertension: Secondary | ICD-10-CM | POA: Diagnosis not present

## 2018-10-11 DIAGNOSIS — Z7901 Long term (current) use of anticoagulants: Secondary | ICD-10-CM | POA: Diagnosis not present

## 2018-10-11 DIAGNOSIS — I4891 Unspecified atrial fibrillation: Secondary | ICD-10-CM | POA: Diagnosis not present

## 2018-10-11 DIAGNOSIS — S51812D Laceration without foreign body of left forearm, subsequent encounter: Secondary | ICD-10-CM | POA: Diagnosis not present

## 2018-10-13 DIAGNOSIS — R4182 Altered mental status, unspecified: Secondary | ICD-10-CM | POA: Diagnosis not present

## 2018-10-13 DIAGNOSIS — Z7901 Long term (current) use of anticoagulants: Secondary | ICD-10-CM | POA: Diagnosis not present

## 2018-10-13 DIAGNOSIS — I4891 Unspecified atrial fibrillation: Secondary | ICD-10-CM | POA: Diagnosis not present

## 2018-10-13 DIAGNOSIS — R410 Disorientation, unspecified: Secondary | ICD-10-CM | POA: Diagnosis not present

## 2018-10-13 DIAGNOSIS — W19XXXA Unspecified fall, initial encounter: Secondary | ICD-10-CM | POA: Diagnosis not present

## 2018-10-13 DIAGNOSIS — Z87891 Personal history of nicotine dependence: Secondary | ICD-10-CM | POA: Diagnosis not present

## 2018-10-13 DIAGNOSIS — S81809A Unspecified open wound, unspecified lower leg, initial encounter: Secondary | ICD-10-CM | POA: Diagnosis not present

## 2018-10-13 DIAGNOSIS — R454 Irritability and anger: Secondary | ICD-10-CM | POA: Diagnosis not present

## 2018-10-13 DIAGNOSIS — K5909 Other constipation: Secondary | ICD-10-CM | POA: Diagnosis not present

## 2018-10-13 DIAGNOSIS — R6889 Other general symptoms and signs: Secondary | ICD-10-CM | POA: Diagnosis not present

## 2018-10-13 DIAGNOSIS — S51812D Laceration without foreign body of left forearm, subsequent encounter: Secondary | ICD-10-CM | POA: Diagnosis not present

## 2018-10-13 DIAGNOSIS — G301 Alzheimer's disease with late onset: Secondary | ICD-10-CM | POA: Diagnosis not present

## 2018-10-13 DIAGNOSIS — N183 Chronic kidney disease, stage 3 (moderate): Secondary | ICD-10-CM | POA: Diagnosis not present

## 2018-10-13 DIAGNOSIS — F330 Major depressive disorder, recurrent, mild: Secondary | ICD-10-CM | POA: Diagnosis not present

## 2018-10-13 DIAGNOSIS — D638 Anemia in other chronic diseases classified elsewhere: Secondary | ICD-10-CM | POA: Diagnosis not present

## 2018-10-13 DIAGNOSIS — N390 Urinary tract infection, site not specified: Secondary | ICD-10-CM | POA: Diagnosis present

## 2018-10-13 DIAGNOSIS — E0590 Thyrotoxicosis, unspecified without thyrotoxic crisis or storm: Secondary | ICD-10-CM | POA: Diagnosis not present

## 2018-10-13 DIAGNOSIS — R110 Nausea: Secondary | ICD-10-CM | POA: Diagnosis not present

## 2018-10-13 DIAGNOSIS — R5381 Other malaise: Secondary | ICD-10-CM | POA: Diagnosis not present

## 2018-10-13 DIAGNOSIS — Z743 Need for continuous supervision: Secondary | ICD-10-CM | POA: Diagnosis not present

## 2018-10-13 DIAGNOSIS — K219 Gastro-esophageal reflux disease without esophagitis: Secondary | ICD-10-CM | POA: Diagnosis present

## 2018-10-13 DIAGNOSIS — F0390 Unspecified dementia without behavioral disturbance: Secondary | ICD-10-CM | POA: Diagnosis not present

## 2018-10-13 DIAGNOSIS — F22 Delusional disorders: Secondary | ICD-10-CM | POA: Diagnosis not present

## 2018-10-13 DIAGNOSIS — F0281 Dementia in other diseases classified elsewhere with behavioral disturbance: Secondary | ICD-10-CM | POA: Diagnosis not present

## 2018-10-13 DIAGNOSIS — R946 Abnormal results of thyroid function studies: Secondary | ICD-10-CM | POA: Diagnosis not present

## 2018-10-13 DIAGNOSIS — Z79899 Other long term (current) drug therapy: Secondary | ICD-10-CM | POA: Diagnosis not present

## 2018-10-13 DIAGNOSIS — S0101XD Laceration without foreign body of scalp, subsequent encounter: Secondary | ICD-10-CM | POA: Diagnosis not present

## 2018-10-13 DIAGNOSIS — R1111 Vomiting without nausea: Secondary | ICD-10-CM | POA: Diagnosis not present

## 2018-10-13 DIAGNOSIS — I2510 Atherosclerotic heart disease of native coronary artery without angina pectoris: Secondary | ICD-10-CM | POA: Diagnosis present

## 2018-10-13 DIAGNOSIS — F0391 Unspecified dementia with behavioral disturbance: Secondary | ICD-10-CM | POA: Diagnosis present

## 2018-10-13 DIAGNOSIS — I10 Essential (primary) hypertension: Secondary | ICD-10-CM | POA: Diagnosis not present

## 2018-10-13 DIAGNOSIS — G3184 Mild cognitive impairment, so stated: Secondary | ICD-10-CM | POA: Diagnosis not present

## 2018-10-13 DIAGNOSIS — S41109A Unspecified open wound of unspecified upper arm, initial encounter: Secondary | ICD-10-CM | POA: Diagnosis not present

## 2018-10-13 DIAGNOSIS — R112 Nausea with vomiting, unspecified: Secondary | ICD-10-CM | POA: Diagnosis not present

## 2018-10-13 DIAGNOSIS — G4700 Insomnia, unspecified: Secondary | ICD-10-CM | POA: Diagnosis present

## 2018-10-13 DIAGNOSIS — E860 Dehydration: Secondary | ICD-10-CM | POA: Diagnosis not present

## 2018-10-13 DIAGNOSIS — Z5181 Encounter for therapeutic drug level monitoring: Secondary | ICD-10-CM | POA: Diagnosis not present

## 2018-10-13 DIAGNOSIS — F064 Anxiety disorder due to known physiological condition: Secondary | ICD-10-CM | POA: Diagnosis not present

## 2018-10-14 DIAGNOSIS — Z7901 Long term (current) use of anticoagulants: Secondary | ICD-10-CM | POA: Diagnosis not present

## 2018-10-14 DIAGNOSIS — I10 Essential (primary) hypertension: Secondary | ICD-10-CM | POA: Diagnosis not present

## 2018-10-14 DIAGNOSIS — S51812D Laceration without foreign body of left forearm, subsequent encounter: Secondary | ICD-10-CM | POA: Diagnosis not present

## 2018-10-14 DIAGNOSIS — I4891 Unspecified atrial fibrillation: Secondary | ICD-10-CM | POA: Diagnosis not present

## 2018-10-14 DIAGNOSIS — F0390 Unspecified dementia without behavioral disturbance: Secondary | ICD-10-CM | POA: Diagnosis not present

## 2018-10-14 DIAGNOSIS — S0101XD Laceration without foreign body of scalp, subsequent encounter: Secondary | ICD-10-CM | POA: Diagnosis not present

## 2018-10-18 DIAGNOSIS — N183 Chronic kidney disease, stage 3 (moderate): Secondary | ICD-10-CM | POA: Diagnosis not present

## 2018-10-18 DIAGNOSIS — F0281 Dementia in other diseases classified elsewhere with behavioral disturbance: Secondary | ICD-10-CM | POA: Diagnosis not present

## 2018-10-18 DIAGNOSIS — Z79899 Other long term (current) drug therapy: Secondary | ICD-10-CM | POA: Diagnosis not present

## 2018-10-18 DIAGNOSIS — F22 Delusional disorders: Secondary | ICD-10-CM | POA: Diagnosis not present

## 2018-10-18 DIAGNOSIS — R4182 Altered mental status, unspecified: Secondary | ICD-10-CM | POA: Diagnosis not present

## 2018-10-18 DIAGNOSIS — D638 Anemia in other chronic diseases classified elsewhere: Secondary | ICD-10-CM | POA: Diagnosis not present

## 2018-10-18 DIAGNOSIS — Z5181 Encounter for therapeutic drug level monitoring: Secondary | ICD-10-CM | POA: Diagnosis not present

## 2018-10-18 DIAGNOSIS — G301 Alzheimer's disease with late onset: Secondary | ICD-10-CM | POA: Diagnosis not present

## 2018-10-27 DIAGNOSIS — Z87891 Personal history of nicotine dependence: Secondary | ICD-10-CM | POA: Diagnosis not present

## 2018-10-27 DIAGNOSIS — R112 Nausea with vomiting, unspecified: Secondary | ICD-10-CM | POA: Diagnosis not present

## 2018-10-27 DIAGNOSIS — I10 Essential (primary) hypertension: Secondary | ICD-10-CM | POA: Diagnosis not present

## 2018-10-27 DIAGNOSIS — E860 Dehydration: Secondary | ICD-10-CM | POA: Diagnosis not present

## 2018-10-27 DIAGNOSIS — Z7901 Long term (current) use of anticoagulants: Secondary | ICD-10-CM | POA: Diagnosis not present

## 2018-11-01 DIAGNOSIS — F0391 Unspecified dementia with behavioral disturbance: Secondary | ICD-10-CM | POA: Diagnosis not present

## 2018-11-01 DIAGNOSIS — E0590 Thyrotoxicosis, unspecified without thyrotoxic crisis or storm: Secondary | ICD-10-CM | POA: Diagnosis not present

## 2018-11-03 DIAGNOSIS — F064 Anxiety disorder due to known physiological condition: Secondary | ICD-10-CM | POA: Diagnosis not present

## 2018-11-03 DIAGNOSIS — G3184 Mild cognitive impairment, so stated: Secondary | ICD-10-CM | POA: Diagnosis not present

## 2018-11-03 DIAGNOSIS — F330 Major depressive disorder, recurrent, mild: Secondary | ICD-10-CM | POA: Diagnosis not present

## 2018-11-03 DIAGNOSIS — R4182 Altered mental status, unspecified: Secondary | ICD-10-CM | POA: Diagnosis not present

## 2018-11-04 DIAGNOSIS — I4891 Unspecified atrial fibrillation: Secondary | ICD-10-CM | POA: Diagnosis not present

## 2018-11-04 DIAGNOSIS — K219 Gastro-esophageal reflux disease without esophagitis: Secondary | ICD-10-CM | POA: Diagnosis not present

## 2018-11-04 DIAGNOSIS — I10 Essential (primary) hypertension: Secondary | ICD-10-CM | POA: Diagnosis not present

## 2018-11-04 DIAGNOSIS — R634 Abnormal weight loss: Secondary | ICD-10-CM | POA: Diagnosis not present

## 2018-11-14 DIAGNOSIS — I10 Essential (primary) hypertension: Secondary | ICD-10-CM | POA: Diagnosis not present

## 2018-11-14 DIAGNOSIS — I4891 Unspecified atrial fibrillation: Secondary | ICD-10-CM | POA: Diagnosis not present

## 2018-11-14 DIAGNOSIS — D538 Other specified nutritional anemias: Secondary | ICD-10-CM | POA: Diagnosis not present

## 2018-11-14 DIAGNOSIS — E119 Type 2 diabetes mellitus without complications: Secondary | ICD-10-CM | POA: Diagnosis not present

## 2018-11-14 DIAGNOSIS — E039 Hypothyroidism, unspecified: Secondary | ICD-10-CM | POA: Diagnosis not present

## 2018-11-14 DIAGNOSIS — E785 Hyperlipidemia, unspecified: Secondary | ICD-10-CM | POA: Diagnosis not present

## 2018-11-17 DIAGNOSIS — G3184 Mild cognitive impairment, so stated: Secondary | ICD-10-CM | POA: Diagnosis not present

## 2018-11-17 DIAGNOSIS — D638 Anemia in other chronic diseases classified elsewhere: Secondary | ICD-10-CM | POA: Diagnosis not present

## 2018-11-17 DIAGNOSIS — N183 Chronic kidney disease, stage 3 (moderate): Secondary | ICD-10-CM | POA: Diagnosis not present

## 2018-11-17 DIAGNOSIS — F330 Major depressive disorder, recurrent, mild: Secondary | ICD-10-CM | POA: Diagnosis not present

## 2018-11-17 DIAGNOSIS — F064 Anxiety disorder due to known physiological condition: Secondary | ICD-10-CM | POA: Diagnosis not present

## 2018-11-18 DIAGNOSIS — E0590 Thyrotoxicosis, unspecified without thyrotoxic crisis or storm: Secondary | ICD-10-CM | POA: Diagnosis not present

## 2018-11-23 DIAGNOSIS — E0590 Thyrotoxicosis, unspecified without thyrotoxic crisis or storm: Secondary | ICD-10-CM | POA: Diagnosis not present

## 2018-11-24 DIAGNOSIS — M19041 Primary osteoarthritis, right hand: Secondary | ICD-10-CM | POA: Diagnosis not present

## 2018-11-24 DIAGNOSIS — M1712 Unilateral primary osteoarthritis, left knee: Secondary | ICD-10-CM | POA: Diagnosis not present

## 2018-11-24 DIAGNOSIS — I4891 Unspecified atrial fibrillation: Secondary | ICD-10-CM | POA: Diagnosis not present

## 2018-11-24 DIAGNOSIS — I10 Essential (primary) hypertension: Secondary | ICD-10-CM | POA: Diagnosis not present

## 2018-11-24 DIAGNOSIS — Z7901 Long term (current) use of anticoagulants: Secondary | ICD-10-CM | POA: Diagnosis not present

## 2018-11-24 DIAGNOSIS — Z9181 History of falling: Secondary | ICD-10-CM | POA: Diagnosis not present

## 2018-11-24 DIAGNOSIS — Z950 Presence of cardiac pacemaker: Secondary | ICD-10-CM | POA: Diagnosis not present

## 2018-11-24 DIAGNOSIS — Z87891 Personal history of nicotine dependence: Secondary | ICD-10-CM | POA: Diagnosis not present

## 2018-11-24 DIAGNOSIS — M19042 Primary osteoarthritis, left hand: Secondary | ICD-10-CM | POA: Diagnosis not present

## 2018-11-24 DIAGNOSIS — E785 Hyperlipidemia, unspecified: Secondary | ICD-10-CM | POA: Diagnosis not present

## 2018-11-24 DIAGNOSIS — E0500 Thyrotoxicosis with diffuse goiter without thyrotoxic crisis or storm: Secondary | ICD-10-CM | POA: Diagnosis not present

## 2018-11-24 DIAGNOSIS — F0390 Unspecified dementia without behavioral disturbance: Secondary | ICD-10-CM | POA: Diagnosis not present

## 2018-11-24 DIAGNOSIS — R7303 Prediabetes: Secondary | ICD-10-CM | POA: Diagnosis not present

## 2018-11-24 DIAGNOSIS — R32 Unspecified urinary incontinence: Secondary | ICD-10-CM | POA: Diagnosis not present

## 2018-11-24 DIAGNOSIS — Z8673 Personal history of transient ischemic attack (TIA), and cerebral infarction without residual deficits: Secondary | ICD-10-CM | POA: Diagnosis not present

## 2018-11-25 DIAGNOSIS — F064 Anxiety disorder due to known physiological condition: Secondary | ICD-10-CM | POA: Diagnosis not present

## 2018-11-25 DIAGNOSIS — G3184 Mild cognitive impairment, so stated: Secondary | ICD-10-CM | POA: Diagnosis not present

## 2018-11-25 DIAGNOSIS — F330 Major depressive disorder, recurrent, mild: Secondary | ICD-10-CM | POA: Diagnosis not present

## 2018-11-30 DIAGNOSIS — Z681 Body mass index (BMI) 19 or less, adult: Secondary | ICD-10-CM | POA: Diagnosis not present

## 2018-11-30 DIAGNOSIS — E0590 Thyrotoxicosis, unspecified without thyrotoxic crisis or storm: Secondary | ICD-10-CM | POA: Diagnosis not present

## 2018-12-01 DIAGNOSIS — M1712 Unilateral primary osteoarthritis, left knee: Secondary | ICD-10-CM | POA: Diagnosis not present

## 2018-12-01 DIAGNOSIS — I10 Essential (primary) hypertension: Secondary | ICD-10-CM | POA: Diagnosis not present

## 2018-12-01 DIAGNOSIS — F0390 Unspecified dementia without behavioral disturbance: Secondary | ICD-10-CM | POA: Diagnosis not present

## 2018-12-01 DIAGNOSIS — M19041 Primary osteoarthritis, right hand: Secondary | ICD-10-CM | POA: Diagnosis not present

## 2018-12-01 DIAGNOSIS — M19042 Primary osteoarthritis, left hand: Secondary | ICD-10-CM | POA: Diagnosis not present

## 2018-12-01 DIAGNOSIS — I4891 Unspecified atrial fibrillation: Secondary | ICD-10-CM | POA: Diagnosis not present

## 2018-12-05 DIAGNOSIS — I4891 Unspecified atrial fibrillation: Secondary | ICD-10-CM | POA: Diagnosis not present

## 2018-12-05 DIAGNOSIS — R634 Abnormal weight loss: Secondary | ICD-10-CM | POA: Diagnosis not present

## 2018-12-05 DIAGNOSIS — E119 Type 2 diabetes mellitus without complications: Secondary | ICD-10-CM | POA: Diagnosis not present

## 2018-12-05 DIAGNOSIS — F0390 Unspecified dementia without behavioral disturbance: Secondary | ICD-10-CM | POA: Diagnosis not present

## 2018-12-05 DIAGNOSIS — M1712 Unilateral primary osteoarthritis, left knee: Secondary | ICD-10-CM | POA: Diagnosis not present

## 2018-12-05 DIAGNOSIS — I10 Essential (primary) hypertension: Secondary | ICD-10-CM | POA: Diagnosis not present

## 2018-12-05 DIAGNOSIS — E0590 Thyrotoxicosis, unspecified without thyrotoxic crisis or storm: Secondary | ICD-10-CM | POA: Diagnosis not present

## 2018-12-05 DIAGNOSIS — R296 Repeated falls: Secondary | ICD-10-CM | POA: Diagnosis not present

## 2018-12-05 DIAGNOSIS — M19042 Primary osteoarthritis, left hand: Secondary | ICD-10-CM | POA: Diagnosis not present

## 2018-12-05 DIAGNOSIS — M19041 Primary osteoarthritis, right hand: Secondary | ICD-10-CM | POA: Diagnosis not present

## 2018-12-08 DIAGNOSIS — F064 Anxiety disorder due to known physiological condition: Secondary | ICD-10-CM | POA: Diagnosis not present

## 2018-12-08 DIAGNOSIS — F330 Major depressive disorder, recurrent, mild: Secondary | ICD-10-CM | POA: Diagnosis not present

## 2018-12-08 DIAGNOSIS — G3184 Mild cognitive impairment, so stated: Secondary | ICD-10-CM | POA: Diagnosis not present

## 2018-12-09 DIAGNOSIS — C44722 Squamous cell carcinoma of skin of right lower limb, including hip: Secondary | ICD-10-CM | POA: Diagnosis not present

## 2018-12-12 DIAGNOSIS — M19042 Primary osteoarthritis, left hand: Secondary | ICD-10-CM | POA: Diagnosis not present

## 2018-12-12 DIAGNOSIS — I4891 Unspecified atrial fibrillation: Secondary | ICD-10-CM | POA: Diagnosis not present

## 2018-12-12 DIAGNOSIS — M19041 Primary osteoarthritis, right hand: Secondary | ICD-10-CM | POA: Diagnosis not present

## 2018-12-12 DIAGNOSIS — F0390 Unspecified dementia without behavioral disturbance: Secondary | ICD-10-CM | POA: Diagnosis not present

## 2018-12-12 DIAGNOSIS — I10 Essential (primary) hypertension: Secondary | ICD-10-CM | POA: Diagnosis not present

## 2018-12-12 DIAGNOSIS — M1712 Unilateral primary osteoarthritis, left knee: Secondary | ICD-10-CM | POA: Diagnosis not present

## 2018-12-18 DIAGNOSIS — G301 Alzheimer's disease with late onset: Secondary | ICD-10-CM | POA: Diagnosis not present

## 2018-12-18 DIAGNOSIS — F331 Major depressive disorder, recurrent, moderate: Secondary | ICD-10-CM | POA: Diagnosis not present

## 2018-12-20 DIAGNOSIS — M1712 Unilateral primary osteoarthritis, left knee: Secondary | ICD-10-CM | POA: Diagnosis not present

## 2018-12-20 DIAGNOSIS — E119 Type 2 diabetes mellitus without complications: Secondary | ICD-10-CM | POA: Diagnosis not present

## 2018-12-20 DIAGNOSIS — F0390 Unspecified dementia without behavioral disturbance: Secondary | ICD-10-CM | POA: Diagnosis not present

## 2018-12-20 DIAGNOSIS — I10 Essential (primary) hypertension: Secondary | ICD-10-CM | POA: Diagnosis not present

## 2018-12-20 DIAGNOSIS — E0590 Thyrotoxicosis, unspecified without thyrotoxic crisis or storm: Secondary | ICD-10-CM | POA: Diagnosis not present

## 2018-12-20 DIAGNOSIS — R296 Repeated falls: Secondary | ICD-10-CM | POA: Diagnosis not present

## 2018-12-20 DIAGNOSIS — M19041 Primary osteoarthritis, right hand: Secondary | ICD-10-CM | POA: Diagnosis not present

## 2018-12-20 DIAGNOSIS — R634 Abnormal weight loss: Secondary | ICD-10-CM | POA: Diagnosis not present

## 2018-12-20 DIAGNOSIS — I4891 Unspecified atrial fibrillation: Secondary | ICD-10-CM | POA: Diagnosis not present

## 2018-12-20 DIAGNOSIS — M19042 Primary osteoarthritis, left hand: Secondary | ICD-10-CM | POA: Diagnosis not present

## 2018-12-21 DIAGNOSIS — M1712 Unilateral primary osteoarthritis, left knee: Secondary | ICD-10-CM | POA: Diagnosis not present

## 2018-12-21 DIAGNOSIS — I10 Essential (primary) hypertension: Secondary | ICD-10-CM | POA: Diagnosis not present

## 2018-12-21 DIAGNOSIS — I4891 Unspecified atrial fibrillation: Secondary | ICD-10-CM | POA: Diagnosis not present

## 2018-12-21 DIAGNOSIS — M19041 Primary osteoarthritis, right hand: Secondary | ICD-10-CM | POA: Diagnosis not present

## 2018-12-21 DIAGNOSIS — F0390 Unspecified dementia without behavioral disturbance: Secondary | ICD-10-CM | POA: Diagnosis not present

## 2018-12-21 DIAGNOSIS — M19042 Primary osteoarthritis, left hand: Secondary | ICD-10-CM | POA: Diagnosis not present

## 2018-12-22 DIAGNOSIS — F064 Anxiety disorder due to known physiological condition: Secondary | ICD-10-CM | POA: Diagnosis not present

## 2018-12-22 DIAGNOSIS — M19042 Primary osteoarthritis, left hand: Secondary | ICD-10-CM | POA: Diagnosis not present

## 2018-12-22 DIAGNOSIS — I10 Essential (primary) hypertension: Secondary | ICD-10-CM | POA: Diagnosis not present

## 2018-12-22 DIAGNOSIS — M1712 Unilateral primary osteoarthritis, left knee: Secondary | ICD-10-CM | POA: Diagnosis not present

## 2018-12-22 DIAGNOSIS — M19041 Primary osteoarthritis, right hand: Secondary | ICD-10-CM | POA: Diagnosis not present

## 2018-12-22 DIAGNOSIS — I4891 Unspecified atrial fibrillation: Secondary | ICD-10-CM | POA: Diagnosis not present

## 2018-12-22 DIAGNOSIS — F0390 Unspecified dementia without behavioral disturbance: Secondary | ICD-10-CM | POA: Diagnosis not present

## 2018-12-23 DIAGNOSIS — E0590 Thyrotoxicosis, unspecified without thyrotoxic crisis or storm: Secondary | ICD-10-CM | POA: Diagnosis not present

## 2018-12-24 DIAGNOSIS — R32 Unspecified urinary incontinence: Secondary | ICD-10-CM | POA: Diagnosis not present

## 2018-12-24 DIAGNOSIS — M1712 Unilateral primary osteoarthritis, left knee: Secondary | ICD-10-CM | POA: Diagnosis not present

## 2018-12-24 DIAGNOSIS — I4891 Unspecified atrial fibrillation: Secondary | ICD-10-CM | POA: Diagnosis not present

## 2018-12-24 DIAGNOSIS — Z7901 Long term (current) use of anticoagulants: Secondary | ICD-10-CM | POA: Diagnosis not present

## 2018-12-24 DIAGNOSIS — F0390 Unspecified dementia without behavioral disturbance: Secondary | ICD-10-CM | POA: Diagnosis not present

## 2018-12-24 DIAGNOSIS — Z9181 History of falling: Secondary | ICD-10-CM | POA: Diagnosis not present

## 2018-12-24 DIAGNOSIS — Z950 Presence of cardiac pacemaker: Secondary | ICD-10-CM | POA: Diagnosis not present

## 2018-12-24 DIAGNOSIS — E785 Hyperlipidemia, unspecified: Secondary | ICD-10-CM | POA: Diagnosis not present

## 2018-12-24 DIAGNOSIS — M19041 Primary osteoarthritis, right hand: Secondary | ICD-10-CM | POA: Diagnosis not present

## 2018-12-24 DIAGNOSIS — Z8673 Personal history of transient ischemic attack (TIA), and cerebral infarction without residual deficits: Secondary | ICD-10-CM | POA: Diagnosis not present

## 2018-12-24 DIAGNOSIS — M19042 Primary osteoarthritis, left hand: Secondary | ICD-10-CM | POA: Diagnosis not present

## 2018-12-24 DIAGNOSIS — E0500 Thyrotoxicosis with diffuse goiter without thyrotoxic crisis or storm: Secondary | ICD-10-CM | POA: Diagnosis not present

## 2018-12-24 DIAGNOSIS — Z87891 Personal history of nicotine dependence: Secondary | ICD-10-CM | POA: Diagnosis not present

## 2018-12-24 DIAGNOSIS — I10 Essential (primary) hypertension: Secondary | ICD-10-CM | POA: Diagnosis not present

## 2018-12-24 DIAGNOSIS — R7303 Prediabetes: Secondary | ICD-10-CM | POA: Diagnosis not present

## 2018-12-25 DIAGNOSIS — M19041 Primary osteoarthritis, right hand: Secondary | ICD-10-CM | POA: Diagnosis not present

## 2018-12-25 DIAGNOSIS — M1712 Unilateral primary osteoarthritis, left knee: Secondary | ICD-10-CM | POA: Diagnosis not present

## 2018-12-25 DIAGNOSIS — F0390 Unspecified dementia without behavioral disturbance: Secondary | ICD-10-CM | POA: Diagnosis not present

## 2018-12-25 DIAGNOSIS — I4891 Unspecified atrial fibrillation: Secondary | ICD-10-CM | POA: Diagnosis not present

## 2018-12-25 DIAGNOSIS — I10 Essential (primary) hypertension: Secondary | ICD-10-CM | POA: Diagnosis not present

## 2018-12-25 DIAGNOSIS — M19042 Primary osteoarthritis, left hand: Secondary | ICD-10-CM | POA: Diagnosis not present

## 2018-12-25 DIAGNOSIS — E039 Hypothyroidism, unspecified: Secondary | ICD-10-CM | POA: Diagnosis not present

## 2018-12-26 DIAGNOSIS — M19041 Primary osteoarthritis, right hand: Secondary | ICD-10-CM | POA: Diagnosis not present

## 2018-12-26 DIAGNOSIS — M1712 Unilateral primary osteoarthritis, left knee: Secondary | ICD-10-CM | POA: Diagnosis not present

## 2018-12-26 DIAGNOSIS — I10 Essential (primary) hypertension: Secondary | ICD-10-CM | POA: Diagnosis not present

## 2018-12-26 DIAGNOSIS — F0390 Unspecified dementia without behavioral disturbance: Secondary | ICD-10-CM | POA: Diagnosis not present

## 2018-12-26 DIAGNOSIS — M19042 Primary osteoarthritis, left hand: Secondary | ICD-10-CM | POA: Diagnosis not present

## 2018-12-26 DIAGNOSIS — I4891 Unspecified atrial fibrillation: Secondary | ICD-10-CM | POA: Diagnosis not present

## 2018-12-28 DIAGNOSIS — M19042 Primary osteoarthritis, left hand: Secondary | ICD-10-CM | POA: Diagnosis not present

## 2018-12-28 DIAGNOSIS — M19041 Primary osteoarthritis, right hand: Secondary | ICD-10-CM | POA: Diagnosis not present

## 2018-12-28 DIAGNOSIS — E0590 Thyrotoxicosis, unspecified without thyrotoxic crisis or storm: Secondary | ICD-10-CM | POA: Diagnosis not present

## 2018-12-28 DIAGNOSIS — F0390 Unspecified dementia without behavioral disturbance: Secondary | ICD-10-CM | POA: Diagnosis not present

## 2018-12-28 DIAGNOSIS — I10 Essential (primary) hypertension: Secondary | ICD-10-CM | POA: Diagnosis not present

## 2018-12-28 DIAGNOSIS — I4891 Unspecified atrial fibrillation: Secondary | ICD-10-CM | POA: Diagnosis not present

## 2018-12-28 DIAGNOSIS — M1712 Unilateral primary osteoarthritis, left knee: Secondary | ICD-10-CM | POA: Diagnosis not present

## 2018-12-30 DIAGNOSIS — I10 Essential (primary) hypertension: Secondary | ICD-10-CM | POA: Diagnosis not present

## 2018-12-30 DIAGNOSIS — F0390 Unspecified dementia without behavioral disturbance: Secondary | ICD-10-CM | POA: Diagnosis not present

## 2018-12-30 DIAGNOSIS — I4891 Unspecified atrial fibrillation: Secondary | ICD-10-CM | POA: Diagnosis not present

## 2018-12-30 DIAGNOSIS — M1712 Unilateral primary osteoarthritis, left knee: Secondary | ICD-10-CM | POA: Diagnosis not present

## 2018-12-30 DIAGNOSIS — M19041 Primary osteoarthritis, right hand: Secondary | ICD-10-CM | POA: Diagnosis not present

## 2018-12-30 DIAGNOSIS — M19042 Primary osteoarthritis, left hand: Secondary | ICD-10-CM | POA: Diagnosis not present

## 2019-01-02 DIAGNOSIS — M1712 Unilateral primary osteoarthritis, left knee: Secondary | ICD-10-CM | POA: Diagnosis not present

## 2019-01-02 DIAGNOSIS — F0390 Unspecified dementia without behavioral disturbance: Secondary | ICD-10-CM | POA: Diagnosis not present

## 2019-01-02 DIAGNOSIS — M19041 Primary osteoarthritis, right hand: Secondary | ICD-10-CM | POA: Diagnosis not present

## 2019-01-02 DIAGNOSIS — I10 Essential (primary) hypertension: Secondary | ICD-10-CM | POA: Diagnosis not present

## 2019-01-02 DIAGNOSIS — M19042 Primary osteoarthritis, left hand: Secondary | ICD-10-CM | POA: Diagnosis not present

## 2019-01-02 DIAGNOSIS — I4891 Unspecified atrial fibrillation: Secondary | ICD-10-CM | POA: Diagnosis not present

## 2019-01-04 DIAGNOSIS — K219 Gastro-esophageal reflux disease without esophagitis: Secondary | ICD-10-CM | POA: Diagnosis not present

## 2019-01-04 DIAGNOSIS — R1310 Dysphagia, unspecified: Secondary | ICD-10-CM | POA: Diagnosis not present

## 2019-01-04 DIAGNOSIS — I4891 Unspecified atrial fibrillation: Secondary | ICD-10-CM | POA: Diagnosis not present

## 2019-01-06 DIAGNOSIS — M1712 Unilateral primary osteoarthritis, left knee: Secondary | ICD-10-CM | POA: Diagnosis not present

## 2019-01-06 DIAGNOSIS — I4891 Unspecified atrial fibrillation: Secondary | ICD-10-CM | POA: Diagnosis not present

## 2019-01-06 DIAGNOSIS — F0390 Unspecified dementia without behavioral disturbance: Secondary | ICD-10-CM | POA: Diagnosis not present

## 2019-01-06 DIAGNOSIS — I10 Essential (primary) hypertension: Secondary | ICD-10-CM | POA: Diagnosis not present

## 2019-01-06 DIAGNOSIS — M19042 Primary osteoarthritis, left hand: Secondary | ICD-10-CM | POA: Diagnosis not present

## 2019-01-06 DIAGNOSIS — M19041 Primary osteoarthritis, right hand: Secondary | ICD-10-CM | POA: Diagnosis not present

## 2019-01-12 DIAGNOSIS — F330 Major depressive disorder, recurrent, mild: Secondary | ICD-10-CM | POA: Diagnosis not present

## 2019-01-12 DIAGNOSIS — F064 Anxiety disorder due to known physiological condition: Secondary | ICD-10-CM | POA: Diagnosis not present

## 2019-01-12 DIAGNOSIS — G3184 Mild cognitive impairment, so stated: Secondary | ICD-10-CM | POA: Diagnosis not present

## 2019-01-13 DIAGNOSIS — M1712 Unilateral primary osteoarthritis, left knee: Secondary | ICD-10-CM | POA: Diagnosis not present

## 2019-01-13 DIAGNOSIS — I10 Essential (primary) hypertension: Secondary | ICD-10-CM | POA: Diagnosis not present

## 2019-01-13 DIAGNOSIS — M19042 Primary osteoarthritis, left hand: Secondary | ICD-10-CM | POA: Diagnosis not present

## 2019-01-13 DIAGNOSIS — R4182 Altered mental status, unspecified: Secondary | ICD-10-CM | POA: Diagnosis not present

## 2019-01-13 DIAGNOSIS — F0390 Unspecified dementia without behavioral disturbance: Secondary | ICD-10-CM | POA: Diagnosis not present

## 2019-01-13 DIAGNOSIS — I4891 Unspecified atrial fibrillation: Secondary | ICD-10-CM | POA: Diagnosis not present

## 2019-01-13 DIAGNOSIS — M19041 Primary osteoarthritis, right hand: Secondary | ICD-10-CM | POA: Diagnosis not present

## 2019-01-14 DIAGNOSIS — Z03818 Encounter for observation for suspected exposure to other biological agents ruled out: Secondary | ICD-10-CM | POA: Diagnosis not present

## 2019-01-15 DIAGNOSIS — M19042 Primary osteoarthritis, left hand: Secondary | ICD-10-CM | POA: Diagnosis not present

## 2019-01-15 DIAGNOSIS — I10 Essential (primary) hypertension: Secondary | ICD-10-CM | POA: Diagnosis not present

## 2019-01-15 DIAGNOSIS — C039 Malignant neoplasm of gum, unspecified: Secondary | ICD-10-CM | POA: Diagnosis not present

## 2019-01-15 DIAGNOSIS — I4891 Unspecified atrial fibrillation: Secondary | ICD-10-CM | POA: Diagnosis not present

## 2019-01-15 DIAGNOSIS — F0390 Unspecified dementia without behavioral disturbance: Secondary | ICD-10-CM | POA: Diagnosis not present

## 2019-01-15 DIAGNOSIS — M1712 Unilateral primary osteoarthritis, left knee: Secondary | ICD-10-CM | POA: Diagnosis not present

## 2019-01-15 DIAGNOSIS — M19041 Primary osteoarthritis, right hand: Secondary | ICD-10-CM | POA: Diagnosis not present

## 2019-01-17 DIAGNOSIS — F17200 Nicotine dependence, unspecified, uncomplicated: Secondary | ICD-10-CM | POA: Diagnosis not present

## 2019-01-17 DIAGNOSIS — F639 Impulse disorder, unspecified: Secondary | ICD-10-CM | POA: Diagnosis not present

## 2019-01-18 DIAGNOSIS — R4182 Altered mental status, unspecified: Secondary | ICD-10-CM | POA: Diagnosis not present

## 2019-01-19 DIAGNOSIS — M1712 Unilateral primary osteoarthritis, left knee: Secondary | ICD-10-CM | POA: Diagnosis not present

## 2019-01-19 DIAGNOSIS — I10 Essential (primary) hypertension: Secondary | ICD-10-CM | POA: Diagnosis not present

## 2019-01-19 DIAGNOSIS — M19041 Primary osteoarthritis, right hand: Secondary | ICD-10-CM | POA: Diagnosis not present

## 2019-01-19 DIAGNOSIS — I4891 Unspecified atrial fibrillation: Secondary | ICD-10-CM | POA: Diagnosis not present

## 2019-01-19 DIAGNOSIS — F0390 Unspecified dementia without behavioral disturbance: Secondary | ICD-10-CM | POA: Diagnosis not present

## 2019-01-19 DIAGNOSIS — M19042 Primary osteoarthritis, left hand: Secondary | ICD-10-CM | POA: Diagnosis not present

## 2019-01-20 DIAGNOSIS — Z03818 Encounter for observation for suspected exposure to other biological agents ruled out: Secondary | ICD-10-CM | POA: Diagnosis not present

## 2019-01-22 DIAGNOSIS — M19042 Primary osteoarthritis, left hand: Secondary | ICD-10-CM | POA: Diagnosis not present

## 2019-01-22 DIAGNOSIS — I10 Essential (primary) hypertension: Secondary | ICD-10-CM | POA: Diagnosis not present

## 2019-01-22 DIAGNOSIS — F0390 Unspecified dementia without behavioral disturbance: Secondary | ICD-10-CM | POA: Diagnosis not present

## 2019-01-22 DIAGNOSIS — I4891 Unspecified atrial fibrillation: Secondary | ICD-10-CM | POA: Diagnosis not present

## 2019-01-22 DIAGNOSIS — M19041 Primary osteoarthritis, right hand: Secondary | ICD-10-CM | POA: Diagnosis not present

## 2019-01-22 DIAGNOSIS — M1712 Unilateral primary osteoarthritis, left knee: Secondary | ICD-10-CM | POA: Diagnosis not present

## 2019-01-23 DIAGNOSIS — M19042 Primary osteoarthritis, left hand: Secondary | ICD-10-CM | POA: Diagnosis not present

## 2019-01-23 DIAGNOSIS — Z950 Presence of cardiac pacemaker: Secondary | ICD-10-CM | POA: Diagnosis not present

## 2019-01-23 DIAGNOSIS — Z952 Presence of prosthetic heart valve: Secondary | ICD-10-CM | POA: Diagnosis not present

## 2019-01-23 DIAGNOSIS — Z96652 Presence of left artificial knee joint: Secondary | ICD-10-CM | POA: Diagnosis not present

## 2019-01-23 DIAGNOSIS — R7303 Prediabetes: Secondary | ICD-10-CM | POA: Diagnosis not present

## 2019-01-23 DIAGNOSIS — E785 Hyperlipidemia, unspecified: Secondary | ICD-10-CM | POA: Diagnosis not present

## 2019-01-23 DIAGNOSIS — M1712 Unilateral primary osteoarthritis, left knee: Secondary | ICD-10-CM | POA: Diagnosis not present

## 2019-01-23 DIAGNOSIS — I10 Essential (primary) hypertension: Secondary | ICD-10-CM | POA: Diagnosis not present

## 2019-01-23 DIAGNOSIS — I4891 Unspecified atrial fibrillation: Secondary | ICD-10-CM | POA: Diagnosis not present

## 2019-01-23 DIAGNOSIS — R32 Unspecified urinary incontinence: Secondary | ICD-10-CM | POA: Diagnosis not present

## 2019-01-23 DIAGNOSIS — M19041 Primary osteoarthritis, right hand: Secondary | ICD-10-CM | POA: Diagnosis not present

## 2019-01-23 DIAGNOSIS — F419 Anxiety disorder, unspecified: Secondary | ICD-10-CM | POA: Diagnosis not present

## 2019-01-23 DIAGNOSIS — Z9181 History of falling: Secondary | ICD-10-CM | POA: Diagnosis not present

## 2019-01-23 DIAGNOSIS — Z87891 Personal history of nicotine dependence: Secondary | ICD-10-CM | POA: Diagnosis not present

## 2019-01-23 DIAGNOSIS — F0390 Unspecified dementia without behavioral disturbance: Secondary | ICD-10-CM | POA: Diagnosis not present

## 2019-01-23 DIAGNOSIS — E0500 Thyrotoxicosis with diffuse goiter without thyrotoxic crisis or storm: Secondary | ICD-10-CM | POA: Diagnosis not present

## 2019-01-23 DIAGNOSIS — Z8673 Personal history of transient ischemic attack (TIA), and cerebral infarction without residual deficits: Secondary | ICD-10-CM | POA: Diagnosis not present

## 2019-01-23 DIAGNOSIS — K5790 Diverticulosis of intestine, part unspecified, without perforation or abscess without bleeding: Secondary | ICD-10-CM | POA: Diagnosis not present

## 2019-01-23 DIAGNOSIS — Z7901 Long term (current) use of anticoagulants: Secondary | ICD-10-CM | POA: Diagnosis not present

## 2019-01-23 DIAGNOSIS — Z955 Presence of coronary angioplasty implant and graft: Secondary | ICD-10-CM | POA: Diagnosis not present

## 2019-01-25 DIAGNOSIS — N390 Urinary tract infection, site not specified: Secondary | ICD-10-CM | POA: Diagnosis not present

## 2019-01-25 DIAGNOSIS — I4891 Unspecified atrial fibrillation: Secondary | ICD-10-CM | POA: Diagnosis not present

## 2019-01-25 DIAGNOSIS — M1712 Unilateral primary osteoarthritis, left knee: Secondary | ICD-10-CM | POA: Diagnosis not present

## 2019-01-25 DIAGNOSIS — E0500 Thyrotoxicosis with diffuse goiter without thyrotoxic crisis or storm: Secondary | ICD-10-CM | POA: Diagnosis not present

## 2019-01-25 DIAGNOSIS — F0390 Unspecified dementia without behavioral disturbance: Secondary | ICD-10-CM | POA: Diagnosis not present

## 2019-01-25 DIAGNOSIS — M19041 Primary osteoarthritis, right hand: Secondary | ICD-10-CM | POA: Diagnosis not present

## 2019-01-25 DIAGNOSIS — R630 Anorexia: Secondary | ICD-10-CM | POA: Diagnosis not present

## 2019-01-25 DIAGNOSIS — I10 Essential (primary) hypertension: Secondary | ICD-10-CM | POA: Diagnosis not present

## 2019-01-26 DIAGNOSIS — F064 Anxiety disorder due to known physiological condition: Secondary | ICD-10-CM | POA: Diagnosis not present

## 2019-02-01 DIAGNOSIS — I4891 Unspecified atrial fibrillation: Secondary | ICD-10-CM | POA: Diagnosis not present

## 2019-02-01 DIAGNOSIS — F0390 Unspecified dementia without behavioral disturbance: Secondary | ICD-10-CM | POA: Diagnosis not present

## 2019-02-01 DIAGNOSIS — I10 Essential (primary) hypertension: Secondary | ICD-10-CM | POA: Diagnosis not present

## 2019-02-01 DIAGNOSIS — M1712 Unilateral primary osteoarthritis, left knee: Secondary | ICD-10-CM | POA: Diagnosis not present

## 2019-02-01 DIAGNOSIS — E0500 Thyrotoxicosis with diffuse goiter without thyrotoxic crisis or storm: Secondary | ICD-10-CM | POA: Diagnosis not present

## 2019-02-01 DIAGNOSIS — M19041 Primary osteoarthritis, right hand: Secondary | ICD-10-CM | POA: Diagnosis not present

## 2019-02-03 DIAGNOSIS — R634 Abnormal weight loss: Secondary | ICD-10-CM | POA: Diagnosis not present

## 2019-02-03 DIAGNOSIS — I4891 Unspecified atrial fibrillation: Secondary | ICD-10-CM | POA: Diagnosis not present

## 2019-02-03 DIAGNOSIS — E559 Vitamin D deficiency, unspecified: Secondary | ICD-10-CM | POA: Diagnosis not present

## 2019-02-03 DIAGNOSIS — L853 Xerosis cutis: Secondary | ICD-10-CM | POA: Diagnosis not present

## 2019-02-03 DIAGNOSIS — R1310 Dysphagia, unspecified: Secondary | ICD-10-CM | POA: Diagnosis not present

## 2019-02-03 DIAGNOSIS — K219 Gastro-esophageal reflux disease without esophagitis: Secondary | ICD-10-CM | POA: Diagnosis not present

## 2019-02-03 DIAGNOSIS — I209 Angina pectoris, unspecified: Secondary | ICD-10-CM | POA: Diagnosis not present

## 2019-02-03 DIAGNOSIS — E119 Type 2 diabetes mellitus without complications: Secondary | ICD-10-CM | POA: Diagnosis not present

## 2019-02-03 DIAGNOSIS — I10 Essential (primary) hypertension: Secondary | ICD-10-CM | POA: Diagnosis not present

## 2019-02-03 DIAGNOSIS — E039 Hypothyroidism, unspecified: Secondary | ICD-10-CM | POA: Diagnosis not present

## 2019-02-03 DIAGNOSIS — R296 Repeated falls: Secondary | ICD-10-CM | POA: Diagnosis not present

## 2019-02-03 DIAGNOSIS — R4182 Altered mental status, unspecified: Secondary | ICD-10-CM | POA: Diagnosis not present

## 2019-02-09 DIAGNOSIS — E0500 Thyrotoxicosis with diffuse goiter without thyrotoxic crisis or storm: Secondary | ICD-10-CM | POA: Diagnosis not present

## 2019-02-09 DIAGNOSIS — M1712 Unilateral primary osteoarthritis, left knee: Secondary | ICD-10-CM | POA: Diagnosis not present

## 2019-02-09 DIAGNOSIS — F0390 Unspecified dementia without behavioral disturbance: Secondary | ICD-10-CM | POA: Diagnosis not present

## 2019-02-09 DIAGNOSIS — I4891 Unspecified atrial fibrillation: Secondary | ICD-10-CM | POA: Diagnosis not present

## 2019-02-09 DIAGNOSIS — I10 Essential (primary) hypertension: Secondary | ICD-10-CM | POA: Diagnosis not present

## 2019-02-09 DIAGNOSIS — M19041 Primary osteoarthritis, right hand: Secondary | ICD-10-CM | POA: Diagnosis not present

## 2019-02-13 DIAGNOSIS — I4891 Unspecified atrial fibrillation: Secondary | ICD-10-CM | POA: Diagnosis not present

## 2019-02-13 DIAGNOSIS — F0390 Unspecified dementia without behavioral disturbance: Secondary | ICD-10-CM | POA: Diagnosis not present

## 2019-02-13 DIAGNOSIS — I10 Essential (primary) hypertension: Secondary | ICD-10-CM | POA: Diagnosis not present

## 2019-02-13 DIAGNOSIS — M19041 Primary osteoarthritis, right hand: Secondary | ICD-10-CM | POA: Diagnosis not present

## 2019-02-13 DIAGNOSIS — M1712 Unilateral primary osteoarthritis, left knee: Secondary | ICD-10-CM | POA: Diagnosis not present

## 2019-02-13 DIAGNOSIS — E0500 Thyrotoxicosis with diffuse goiter without thyrotoxic crisis or storm: Secondary | ICD-10-CM | POA: Diagnosis not present

## 2019-02-24 DIAGNOSIS — I10 Essential (primary) hypertension: Secondary | ICD-10-CM | POA: Diagnosis not present

## 2019-02-27 DIAGNOSIS — Z87891 Personal history of nicotine dependence: Secondary | ICD-10-CM | POA: Diagnosis not present

## 2019-02-27 DIAGNOSIS — E785 Hyperlipidemia, unspecified: Secondary | ICD-10-CM | POA: Diagnosis not present

## 2019-02-27 DIAGNOSIS — Z8673 Personal history of transient ischemic attack (TIA), and cerebral infarction without residual deficits: Secondary | ICD-10-CM | POA: Diagnosis not present

## 2019-02-27 DIAGNOSIS — Z79899 Other long term (current) drug therapy: Secondary | ICD-10-CM | POA: Diagnosis not present

## 2019-02-27 DIAGNOSIS — F0390 Unspecified dementia without behavioral disturbance: Secondary | ICD-10-CM | POA: Diagnosis not present

## 2019-02-27 DIAGNOSIS — E039 Hypothyroidism, unspecified: Secondary | ICD-10-CM | POA: Diagnosis not present

## 2019-02-27 DIAGNOSIS — I10 Essential (primary) hypertension: Secondary | ICD-10-CM | POA: Diagnosis not present

## 2019-02-27 DIAGNOSIS — R58 Hemorrhage, not elsewhere classified: Secondary | ICD-10-CM | POA: Diagnosis not present

## 2019-02-27 DIAGNOSIS — K219 Gastro-esophageal reflux disease without esophagitis: Secondary | ICD-10-CM | POA: Diagnosis not present

## 2019-02-27 DIAGNOSIS — S0083XA Contusion of other part of head, initial encounter: Secondary | ICD-10-CM | POA: Diagnosis not present

## 2019-02-27 DIAGNOSIS — S0990XA Unspecified injury of head, initial encounter: Secondary | ICD-10-CM | POA: Diagnosis not present

## 2019-02-27 DIAGNOSIS — W19XXXA Unspecified fall, initial encounter: Secondary | ICD-10-CM | POA: Diagnosis not present

## 2019-02-27 DIAGNOSIS — R030 Elevated blood-pressure reading, without diagnosis of hypertension: Secondary | ICD-10-CM | POA: Diagnosis not present

## 2019-02-27 DIAGNOSIS — I4891 Unspecified atrial fibrillation: Secondary | ICD-10-CM | POA: Diagnosis not present

## 2019-02-27 DIAGNOSIS — S60511A Abrasion of right hand, initial encounter: Secondary | ICD-10-CM | POA: Diagnosis not present

## 2019-02-27 DIAGNOSIS — E119 Type 2 diabetes mellitus without complications: Secondary | ICD-10-CM | POA: Diagnosis not present

## 2019-03-01 DIAGNOSIS — R296 Repeated falls: Secondary | ICD-10-CM | POA: Diagnosis not present

## 2019-03-09 DIAGNOSIS — F0281 Dementia in other diseases classified elsewhere with behavioral disturbance: Secondary | ICD-10-CM | POA: Diagnosis not present

## 2019-03-09 DIAGNOSIS — F5105 Insomnia due to other mental disorder: Secondary | ICD-10-CM | POA: Diagnosis not present

## 2019-03-10 DIAGNOSIS — I10 Essential (primary) hypertension: Secondary | ICD-10-CM | POA: Diagnosis not present

## 2019-03-10 DIAGNOSIS — R296 Repeated falls: Secondary | ICD-10-CM | POA: Diagnosis not present

## 2019-03-10 DIAGNOSIS — F0281 Dementia in other diseases classified elsewhere with behavioral disturbance: Secondary | ICD-10-CM | POA: Diagnosis not present

## 2019-03-10 DIAGNOSIS — R634 Abnormal weight loss: Secondary | ICD-10-CM | POA: Diagnosis not present

## 2019-03-23 DIAGNOSIS — F0281 Dementia in other diseases classified elsewhere with behavioral disturbance: Secondary | ICD-10-CM | POA: Diagnosis not present

## 2019-04-05 DIAGNOSIS — E0590 Thyrotoxicosis, unspecified without thyrotoxic crisis or storm: Secondary | ICD-10-CM | POA: Diagnosis not present

## 2019-04-05 DIAGNOSIS — K219 Gastro-esophageal reflux disease without esophagitis: Secondary | ICD-10-CM | POA: Diagnosis not present

## 2019-04-05 DIAGNOSIS — I482 Chronic atrial fibrillation, unspecified: Secondary | ICD-10-CM | POA: Diagnosis not present

## 2019-04-06 DIAGNOSIS — F0281 Dementia in other diseases classified elsewhere with behavioral disturbance: Secondary | ICD-10-CM | POA: Diagnosis not present

## 2019-04-06 DIAGNOSIS — F5105 Insomnia due to other mental disorder: Secondary | ICD-10-CM | POA: Diagnosis not present

## 2019-04-17 DIAGNOSIS — I209 Angina pectoris, unspecified: Secondary | ICD-10-CM | POA: Diagnosis not present

## 2019-04-17 DIAGNOSIS — I10 Essential (primary) hypertension: Secondary | ICD-10-CM | POA: Diagnosis not present

## 2019-04-17 DIAGNOSIS — R296 Repeated falls: Secondary | ICD-10-CM | POA: Diagnosis not present

## 2019-04-18 DIAGNOSIS — I209 Angina pectoris, unspecified: Secondary | ICD-10-CM | POA: Diagnosis not present

## 2019-04-18 DIAGNOSIS — I482 Chronic atrial fibrillation, unspecified: Secondary | ICD-10-CM | POA: Diagnosis not present

## 2019-04-18 DIAGNOSIS — I10 Essential (primary) hypertension: Secondary | ICD-10-CM | POA: Diagnosis not present

## 2019-04-24 DIAGNOSIS — I10 Essential (primary) hypertension: Secondary | ICD-10-CM | POA: Diagnosis not present

## 2019-04-27 DIAGNOSIS — F0281 Dementia in other diseases classified elsewhere with behavioral disturbance: Secondary | ICD-10-CM | POA: Diagnosis not present

## 2019-05-03 DIAGNOSIS — G3184 Mild cognitive impairment, so stated: Secondary | ICD-10-CM | POA: Diagnosis not present

## 2019-05-03 DIAGNOSIS — R41842 Visuospatial deficit: Secondary | ICD-10-CM | POA: Diagnosis not present

## 2019-05-03 DIAGNOSIS — E0590 Thyrotoxicosis, unspecified without thyrotoxic crisis or storm: Secondary | ICD-10-CM | POA: Diagnosis not present

## 2019-05-03 DIAGNOSIS — I209 Angina pectoris, unspecified: Secondary | ICD-10-CM | POA: Diagnosis not present

## 2019-05-03 DIAGNOSIS — R4182 Altered mental status, unspecified: Secondary | ICD-10-CM | POA: Diagnosis not present

## 2019-05-05 DIAGNOSIS — I4891 Unspecified atrial fibrillation: Secondary | ICD-10-CM | POA: Diagnosis not present

## 2019-05-05 DIAGNOSIS — Z87891 Personal history of nicotine dependence: Secondary | ICD-10-CM | POA: Diagnosis not present

## 2019-05-05 DIAGNOSIS — R1310 Dysphagia, unspecified: Secondary | ICD-10-CM | POA: Diagnosis not present

## 2019-05-05 DIAGNOSIS — K219 Gastro-esophageal reflux disease without esophagitis: Secondary | ICD-10-CM | POA: Diagnosis not present

## 2019-05-05 DIAGNOSIS — E0590 Thyrotoxicosis, unspecified without thyrotoxic crisis or storm: Secondary | ICD-10-CM | POA: Diagnosis not present

## 2019-05-05 DIAGNOSIS — R32 Unspecified urinary incontinence: Secondary | ICD-10-CM | POA: Diagnosis not present

## 2019-05-05 DIAGNOSIS — E119 Type 2 diabetes mellitus without complications: Secondary | ICD-10-CM | POA: Diagnosis not present

## 2019-05-05 DIAGNOSIS — Z9181 History of falling: Secondary | ICD-10-CM | POA: Diagnosis not present

## 2019-05-05 DIAGNOSIS — I10 Essential (primary) hypertension: Secondary | ICD-10-CM | POA: Diagnosis not present

## 2019-05-05 DIAGNOSIS — Z7901 Long term (current) use of anticoagulants: Secondary | ICD-10-CM | POA: Diagnosis not present

## 2019-05-05 DIAGNOSIS — Z8673 Personal history of transient ischemic attack (TIA), and cerebral infarction without residual deficits: Secondary | ICD-10-CM | POA: Diagnosis not present

## 2019-05-05 DIAGNOSIS — E559 Vitamin D deficiency, unspecified: Secondary | ICD-10-CM | POA: Diagnosis not present

## 2019-05-05 DIAGNOSIS — G3184 Mild cognitive impairment, so stated: Secondary | ICD-10-CM | POA: Diagnosis not present

## 2019-05-05 DIAGNOSIS — F064 Anxiety disorder due to known physiological condition: Secondary | ICD-10-CM | POA: Diagnosis not present

## 2019-05-07 DIAGNOSIS — Z87891 Personal history of nicotine dependence: Secondary | ICD-10-CM | POA: Diagnosis not present

## 2019-05-07 DIAGNOSIS — I6782 Cerebral ischemia: Secondary | ICD-10-CM | POA: Diagnosis not present

## 2019-05-07 DIAGNOSIS — F0281 Dementia in other diseases classified elsewhere with behavioral disturbance: Secondary | ICD-10-CM | POA: Diagnosis not present

## 2019-05-07 DIAGNOSIS — R4182 Altered mental status, unspecified: Secondary | ICD-10-CM | POA: Diagnosis not present

## 2019-05-07 DIAGNOSIS — Z888 Allergy status to other drugs, medicaments and biological substances status: Secondary | ICD-10-CM | POA: Diagnosis not present

## 2019-05-07 DIAGNOSIS — Z8673 Personal history of transient ischemic attack (TIA), and cerebral infarction without residual deficits: Secondary | ICD-10-CM | POA: Diagnosis not present

## 2019-05-07 DIAGNOSIS — K219 Gastro-esophageal reflux disease without esophagitis: Secondary | ICD-10-CM | POA: Diagnosis not present

## 2019-05-07 DIAGNOSIS — I4891 Unspecified atrial fibrillation: Secondary | ICD-10-CM | POA: Diagnosis not present

## 2019-05-07 DIAGNOSIS — E785 Hyperlipidemia, unspecified: Secondary | ICD-10-CM | POA: Diagnosis not present

## 2019-05-07 DIAGNOSIS — R456 Violent behavior: Secondary | ICD-10-CM | POA: Diagnosis not present

## 2019-05-07 DIAGNOSIS — Z7901 Long term (current) use of anticoagulants: Secondary | ICD-10-CM | POA: Diagnosis not present

## 2019-05-07 DIAGNOSIS — E032 Hypothyroidism due to medicaments and other exogenous substances: Secondary | ICD-10-CM | POA: Diagnosis not present

## 2019-05-07 DIAGNOSIS — I10 Essential (primary) hypertension: Secondary | ICD-10-CM | POA: Diagnosis not present

## 2019-05-07 DIAGNOSIS — G3109 Other frontotemporal dementia: Secondary | ICD-10-CM | POA: Diagnosis not present

## 2019-05-07 DIAGNOSIS — Z79899 Other long term (current) drug therapy: Secondary | ICD-10-CM | POA: Diagnosis not present

## 2019-05-07 DIAGNOSIS — R404 Transient alteration of awareness: Secondary | ICD-10-CM | POA: Diagnosis not present

## 2019-05-07 DIAGNOSIS — R410 Disorientation, unspecified: Secondary | ICD-10-CM | POA: Diagnosis not present

## 2019-05-08 DIAGNOSIS — Z681 Body mass index (BMI) 19 or less, adult: Secondary | ICD-10-CM | POA: Diagnosis not present

## 2019-05-08 DIAGNOSIS — E0590 Thyrotoxicosis, unspecified without thyrotoxic crisis or storm: Secondary | ICD-10-CM | POA: Diagnosis not present

## 2019-05-08 DIAGNOSIS — R4182 Altered mental status, unspecified: Secondary | ICD-10-CM | POA: Diagnosis not present

## 2019-05-08 DIAGNOSIS — I10 Essential (primary) hypertension: Secondary | ICD-10-CM | POA: Diagnosis not present

## 2019-05-10 DIAGNOSIS — Z23 Encounter for immunization: Secondary | ICD-10-CM | POA: Diagnosis not present

## 2019-05-11 DIAGNOSIS — F5105 Insomnia due to other mental disorder: Secondary | ICD-10-CM | POA: Diagnosis not present

## 2019-05-11 DIAGNOSIS — F0281 Dementia in other diseases classified elsewhere with behavioral disturbance: Secondary | ICD-10-CM | POA: Diagnosis not present

## 2019-05-12 DIAGNOSIS — E0590 Thyrotoxicosis, unspecified without thyrotoxic crisis or storm: Secondary | ICD-10-CM | POA: Diagnosis not present

## 2019-05-12 DIAGNOSIS — I4891 Unspecified atrial fibrillation: Secondary | ICD-10-CM | POA: Diagnosis not present

## 2019-05-12 DIAGNOSIS — G3184 Mild cognitive impairment, so stated: Secondary | ICD-10-CM | POA: Diagnosis not present

## 2019-05-12 DIAGNOSIS — I10 Essential (primary) hypertension: Secondary | ICD-10-CM | POA: Diagnosis not present

## 2019-05-12 DIAGNOSIS — F064 Anxiety disorder due to known physiological condition: Secondary | ICD-10-CM | POA: Diagnosis not present

## 2019-05-12 DIAGNOSIS — E119 Type 2 diabetes mellitus without complications: Secondary | ICD-10-CM | POA: Diagnosis not present

## 2019-05-15 DIAGNOSIS — Z950 Presence of cardiac pacemaker: Secondary | ICD-10-CM | POA: Diagnosis not present

## 2019-05-16 DIAGNOSIS — E119 Type 2 diabetes mellitus without complications: Secondary | ICD-10-CM | POA: Diagnosis not present

## 2019-05-16 DIAGNOSIS — I10 Essential (primary) hypertension: Secondary | ICD-10-CM | POA: Diagnosis not present

## 2019-05-16 DIAGNOSIS — G3184 Mild cognitive impairment, so stated: Secondary | ICD-10-CM | POA: Diagnosis not present

## 2019-05-16 DIAGNOSIS — I4891 Unspecified atrial fibrillation: Secondary | ICD-10-CM | POA: Diagnosis not present

## 2019-05-16 DIAGNOSIS — E0590 Thyrotoxicosis, unspecified without thyrotoxic crisis or storm: Secondary | ICD-10-CM | POA: Diagnosis not present

## 2019-05-16 DIAGNOSIS — F064 Anxiety disorder due to known physiological condition: Secondary | ICD-10-CM | POA: Diagnosis not present

## 2019-05-17 DIAGNOSIS — R4182 Altered mental status, unspecified: Secondary | ICD-10-CM | POA: Diagnosis not present

## 2019-05-17 DIAGNOSIS — F0281 Dementia in other diseases classified elsewhere with behavioral disturbance: Secondary | ICD-10-CM | POA: Diagnosis not present

## 2019-05-22 DIAGNOSIS — E1159 Type 2 diabetes mellitus with other circulatory complications: Secondary | ICD-10-CM | POA: Diagnosis not present

## 2019-05-22 DIAGNOSIS — Q845 Enlarged and hypertrophic nails: Secondary | ICD-10-CM | POA: Diagnosis not present

## 2019-05-22 DIAGNOSIS — I739 Peripheral vascular disease, unspecified: Secondary | ICD-10-CM | POA: Diagnosis not present

## 2019-05-22 DIAGNOSIS — B351 Tinea unguium: Secondary | ICD-10-CM | POA: Diagnosis not present

## 2019-05-24 DIAGNOSIS — E0590 Thyrotoxicosis, unspecified without thyrotoxic crisis or storm: Secondary | ICD-10-CM | POA: Diagnosis not present

## 2019-05-24 DIAGNOSIS — I10 Essential (primary) hypertension: Secondary | ICD-10-CM | POA: Diagnosis not present

## 2019-05-24 DIAGNOSIS — E119 Type 2 diabetes mellitus without complications: Secondary | ICD-10-CM | POA: Diagnosis not present

## 2019-05-24 DIAGNOSIS — F064 Anxiety disorder due to known physiological condition: Secondary | ICD-10-CM | POA: Diagnosis not present

## 2019-05-24 DIAGNOSIS — I4891 Unspecified atrial fibrillation: Secondary | ICD-10-CM | POA: Diagnosis not present

## 2019-05-24 DIAGNOSIS — G3184 Mild cognitive impairment, so stated: Secondary | ICD-10-CM | POA: Diagnosis not present

## 2019-05-25 DIAGNOSIS — F0281 Dementia in other diseases classified elsewhere with behavioral disturbance: Secondary | ICD-10-CM | POA: Diagnosis not present

## 2019-05-25 DIAGNOSIS — F0151 Vascular dementia with behavioral disturbance: Secondary | ICD-10-CM | POA: Diagnosis not present

## 2019-05-25 DIAGNOSIS — R296 Repeated falls: Secondary | ICD-10-CM | POA: Diagnosis not present

## 2019-05-25 DIAGNOSIS — E1159 Type 2 diabetes mellitus with other circulatory complications: Secondary | ICD-10-CM | POA: Diagnosis not present

## 2019-05-31 DIAGNOSIS — F064 Anxiety disorder due to known physiological condition: Secondary | ICD-10-CM | POA: Diagnosis not present

## 2019-05-31 DIAGNOSIS — E119 Type 2 diabetes mellitus without complications: Secondary | ICD-10-CM | POA: Diagnosis not present

## 2019-05-31 DIAGNOSIS — I10 Essential (primary) hypertension: Secondary | ICD-10-CM | POA: Diagnosis not present

## 2019-05-31 DIAGNOSIS — I4891 Unspecified atrial fibrillation: Secondary | ICD-10-CM | POA: Diagnosis not present

## 2019-05-31 DIAGNOSIS — G3184 Mild cognitive impairment, so stated: Secondary | ICD-10-CM | POA: Diagnosis not present

## 2019-05-31 DIAGNOSIS — E0590 Thyrotoxicosis, unspecified without thyrotoxic crisis or storm: Secondary | ICD-10-CM | POA: Diagnosis not present

## 2019-06-04 DIAGNOSIS — Z87891 Personal history of nicotine dependence: Secondary | ICD-10-CM | POA: Diagnosis not present

## 2019-06-04 DIAGNOSIS — R1310 Dysphagia, unspecified: Secondary | ICD-10-CM | POA: Diagnosis not present

## 2019-06-04 DIAGNOSIS — F064 Anxiety disorder due to known physiological condition: Secondary | ICD-10-CM | POA: Diagnosis not present

## 2019-06-04 DIAGNOSIS — I10 Essential (primary) hypertension: Secondary | ICD-10-CM | POA: Diagnosis not present

## 2019-06-04 DIAGNOSIS — Z9181 History of falling: Secondary | ICD-10-CM | POA: Diagnosis not present

## 2019-06-04 DIAGNOSIS — I4891 Unspecified atrial fibrillation: Secondary | ICD-10-CM | POA: Diagnosis not present

## 2019-06-04 DIAGNOSIS — E559 Vitamin D deficiency, unspecified: Secondary | ICD-10-CM | POA: Diagnosis not present

## 2019-06-04 DIAGNOSIS — K219 Gastro-esophageal reflux disease without esophagitis: Secondary | ICD-10-CM | POA: Diagnosis not present

## 2019-06-04 DIAGNOSIS — Z8673 Personal history of transient ischemic attack (TIA), and cerebral infarction without residual deficits: Secondary | ICD-10-CM | POA: Diagnosis not present

## 2019-06-04 DIAGNOSIS — E0590 Thyrotoxicosis, unspecified without thyrotoxic crisis or storm: Secondary | ICD-10-CM | POA: Diagnosis not present

## 2019-06-04 DIAGNOSIS — Z7901 Long term (current) use of anticoagulants: Secondary | ICD-10-CM | POA: Diagnosis not present

## 2019-06-04 DIAGNOSIS — G3184 Mild cognitive impairment, so stated: Secondary | ICD-10-CM | POA: Diagnosis not present

## 2019-06-04 DIAGNOSIS — R32 Unspecified urinary incontinence: Secondary | ICD-10-CM | POA: Diagnosis not present

## 2019-06-04 DIAGNOSIS — E119 Type 2 diabetes mellitus without complications: Secondary | ICD-10-CM | POA: Diagnosis not present

## 2019-06-05 DIAGNOSIS — E0590 Thyrotoxicosis, unspecified without thyrotoxic crisis or storm: Secondary | ICD-10-CM | POA: Diagnosis not present

## 2019-06-07 DIAGNOSIS — F064 Anxiety disorder due to known physiological condition: Secondary | ICD-10-CM | POA: Diagnosis not present

## 2019-06-07 DIAGNOSIS — I10 Essential (primary) hypertension: Secondary | ICD-10-CM | POA: Diagnosis not present

## 2019-06-07 DIAGNOSIS — G3184 Mild cognitive impairment, so stated: Secondary | ICD-10-CM | POA: Diagnosis not present

## 2019-06-07 DIAGNOSIS — I4891 Unspecified atrial fibrillation: Secondary | ICD-10-CM | POA: Diagnosis not present

## 2019-06-07 DIAGNOSIS — E0590 Thyrotoxicosis, unspecified without thyrotoxic crisis or storm: Secondary | ICD-10-CM | POA: Diagnosis not present

## 2019-06-07 DIAGNOSIS — E119 Type 2 diabetes mellitus without complications: Secondary | ICD-10-CM | POA: Diagnosis not present

## 2019-06-08 DIAGNOSIS — F0390 Unspecified dementia without behavioral disturbance: Secondary | ICD-10-CM | POA: Diagnosis not present

## 2019-06-08 DIAGNOSIS — F0151 Vascular dementia with behavioral disturbance: Secondary | ICD-10-CM | POA: Diagnosis not present

## 2019-06-08 DIAGNOSIS — I10 Essential (primary) hypertension: Secondary | ICD-10-CM | POA: Diagnosis not present

## 2019-06-08 DIAGNOSIS — F5105 Insomnia due to other mental disorder: Secondary | ICD-10-CM | POA: Diagnosis not present

## 2019-06-08 DIAGNOSIS — F0281 Dementia in other diseases classified elsewhere with behavioral disturbance: Secondary | ICD-10-CM | POA: Diagnosis not present

## 2019-06-09 DIAGNOSIS — R296 Repeated falls: Secondary | ICD-10-CM | POA: Diagnosis not present

## 2019-06-29 DIAGNOSIS — F0151 Vascular dementia with behavioral disturbance: Secondary | ICD-10-CM | POA: Diagnosis not present

## 2019-06-30 DIAGNOSIS — E0590 Thyrotoxicosis, unspecified without thyrotoxic crisis or storm: Secondary | ICD-10-CM | POA: Diagnosis not present

## 2019-06-30 DIAGNOSIS — K219 Gastro-esophageal reflux disease without esophagitis: Secondary | ICD-10-CM | POA: Diagnosis not present

## 2019-06-30 DIAGNOSIS — I10 Essential (primary) hypertension: Secondary | ICD-10-CM | POA: Diagnosis not present

## 2021-07-20 DEATH — deceased
